# Patient Record
Sex: Male | Born: 1960
Health system: Southern US, Community
[De-identification: ages and names within clinical notes are randomized; demographics above are authoritative.]

## PROBLEM LIST (undated history)

## (undated) DIAGNOSIS — R569 Unspecified convulsions: Secondary | ICD-10-CM

## (undated) DIAGNOSIS — I359 Nonrheumatic aortic valve disorder, unspecified: Secondary | ICD-10-CM

## (undated) DIAGNOSIS — F1490 Cocaine use, unspecified, uncomplicated: Secondary | ICD-10-CM

## (undated) DIAGNOSIS — Z7289 Other problems related to lifestyle: Secondary | ICD-10-CM

## (undated) DIAGNOSIS — Z789 Other specified health status: Secondary | ICD-10-CM

## (undated) DIAGNOSIS — F1090 Alcohol use, unspecified, uncomplicated: Secondary | ICD-10-CM

## (undated) DIAGNOSIS — K611 Rectal abscess: Secondary | ICD-10-CM

## (undated) DIAGNOSIS — K219 Gastro-esophageal reflux disease without esophagitis: Secondary | ICD-10-CM

## (undated) DIAGNOSIS — K279 Peptic ulcer, site unspecified, unspecified as acute or chronic, without hemorrhage or perforation: Secondary | ICD-10-CM

## (undated) DIAGNOSIS — T7840XA Allergy, unspecified, initial encounter: Secondary | ICD-10-CM

## (undated) DIAGNOSIS — I639 Cerebral infarction, unspecified: Secondary | ICD-10-CM

## (undated) DIAGNOSIS — Z8489 Family history of other specified conditions: Secondary | ICD-10-CM

## (undated) DIAGNOSIS — Z720 Tobacco use: Secondary | ICD-10-CM

## (undated) DIAGNOSIS — M1990 Unspecified osteoarthritis, unspecified site: Secondary | ICD-10-CM

## (undated) HISTORY — DX: Cocaine use, unspecified, uncomplicated: F14.90

## (undated) HISTORY — DX: Peptic ulcer, site unspecified, unspecified as acute or chronic, without hemorrhage or perforation: K27.9

## (undated) HISTORY — DX: Unspecified osteoarthritis, unspecified site: M19.90

## (undated) HISTORY — DX: Nonrheumatic aortic valve disorder, unspecified: I35.9

## (undated) HISTORY — DX: Other problems related to lifestyle: Z72.89

## (undated) HISTORY — DX: Gastro-esophageal reflux disease without esophagitis: K21.9

## (undated) HISTORY — DX: Tobacco use: Z72.0

## (undated) HISTORY — DX: Cerebral infarction, unspecified: I63.9

## (undated) HISTORY — PX: NO PAST SURGERIES: SHX2092

## (undated) HISTORY — DX: Allergy, unspecified, initial encounter: T78.40XA

## (undated) HISTORY — DX: Alcohol use, unspecified, uncomplicated: F10.90

## (undated) HISTORY — DX: Other specified health status: Z78.9

## (undated) HISTORY — DX: Unspecified convulsions: R56.9

---

## 2006-04-18 ENCOUNTER — Emergency Department (HOSPITAL_COMMUNITY): Admission: EM | Admit: 2006-04-18 | Discharge: 2006-04-18 | Payer: Self-pay | Admitting: Emergency Medicine

## 2006-04-18 IMAGING — CR DG CHEST 2V
2 series · 2 of 2 positions shown · non-contrast
Comparison: None.

CLINICAL DATA: Back pain, cough and fever.
 CHEST ? 2 VIEW:

[w chest pa]
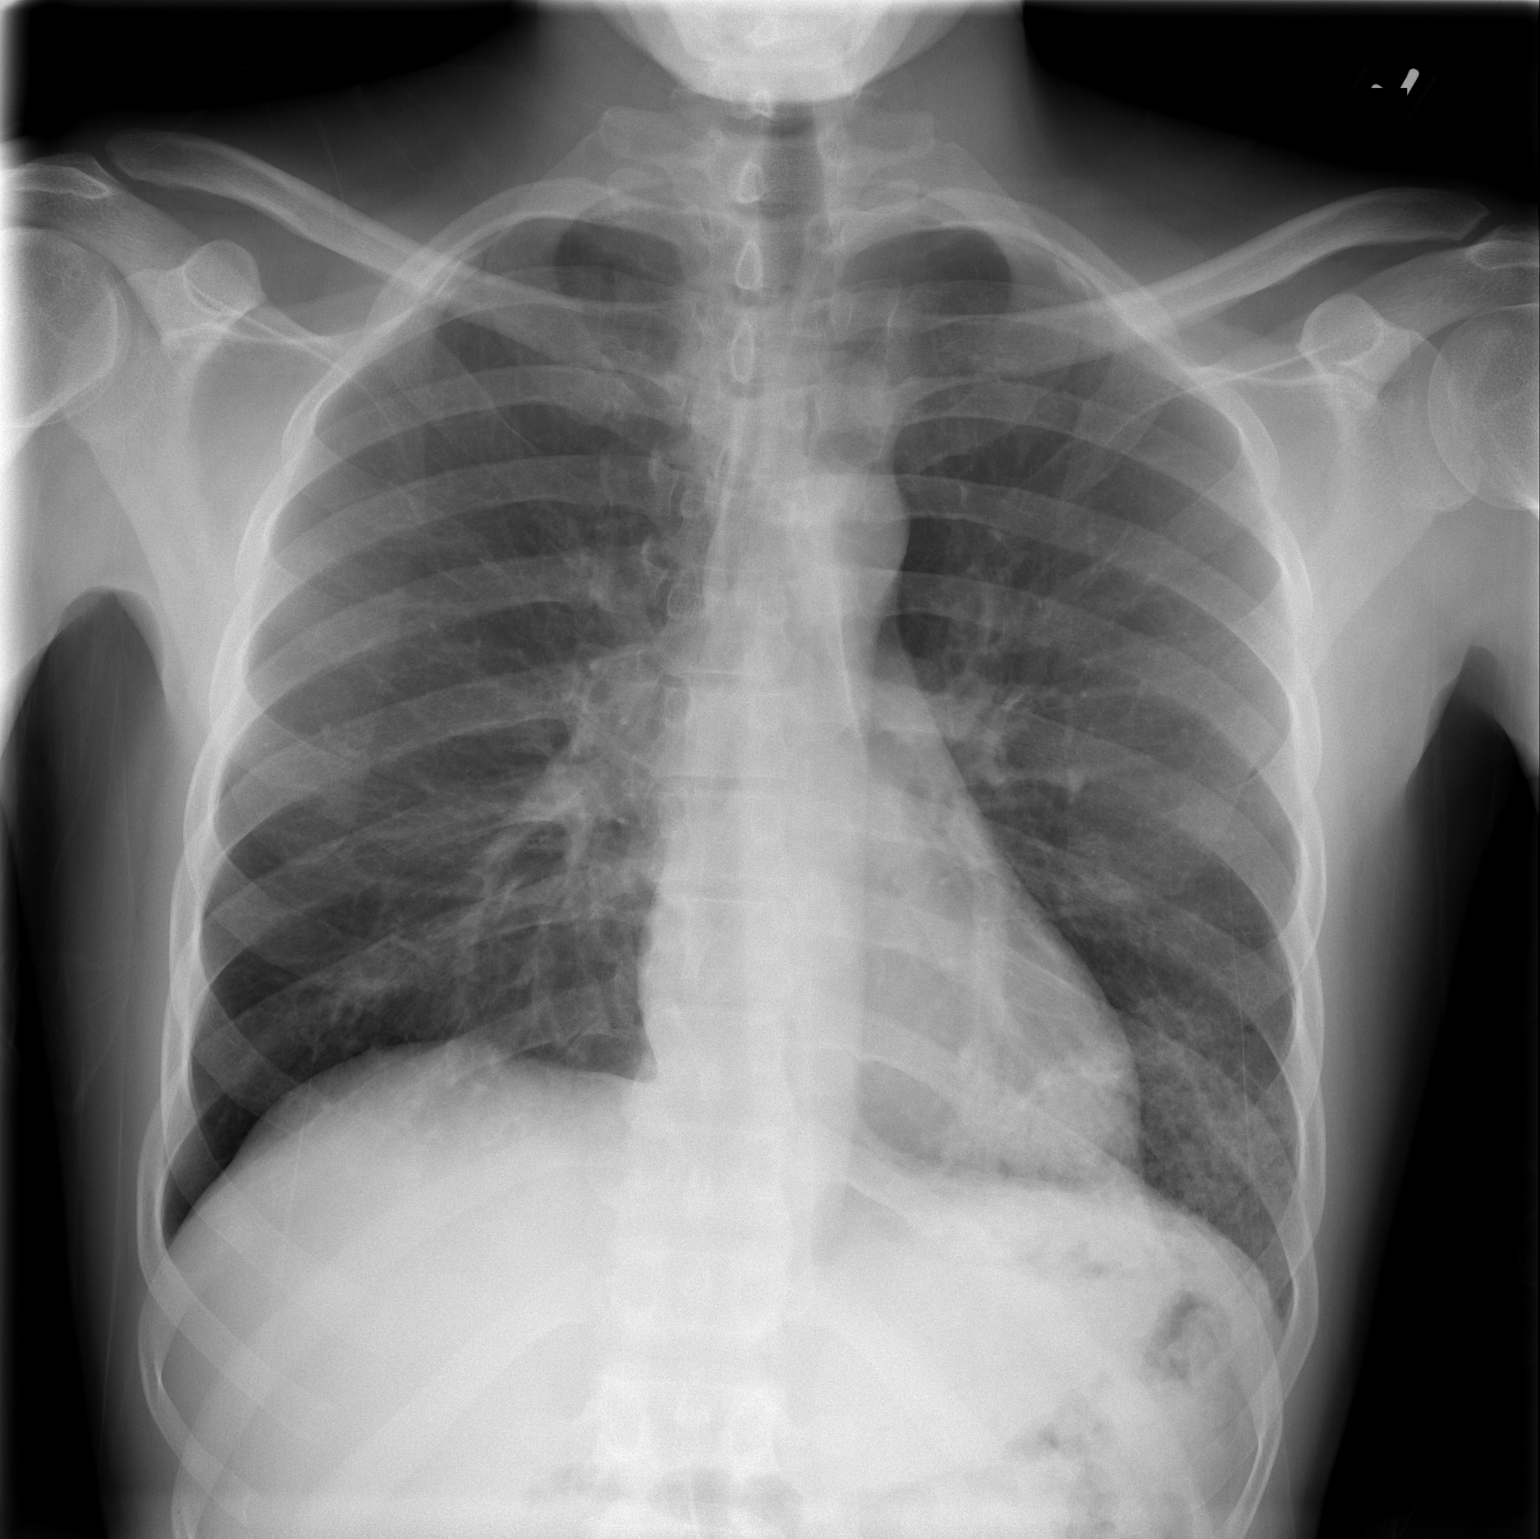

[w chest lat]
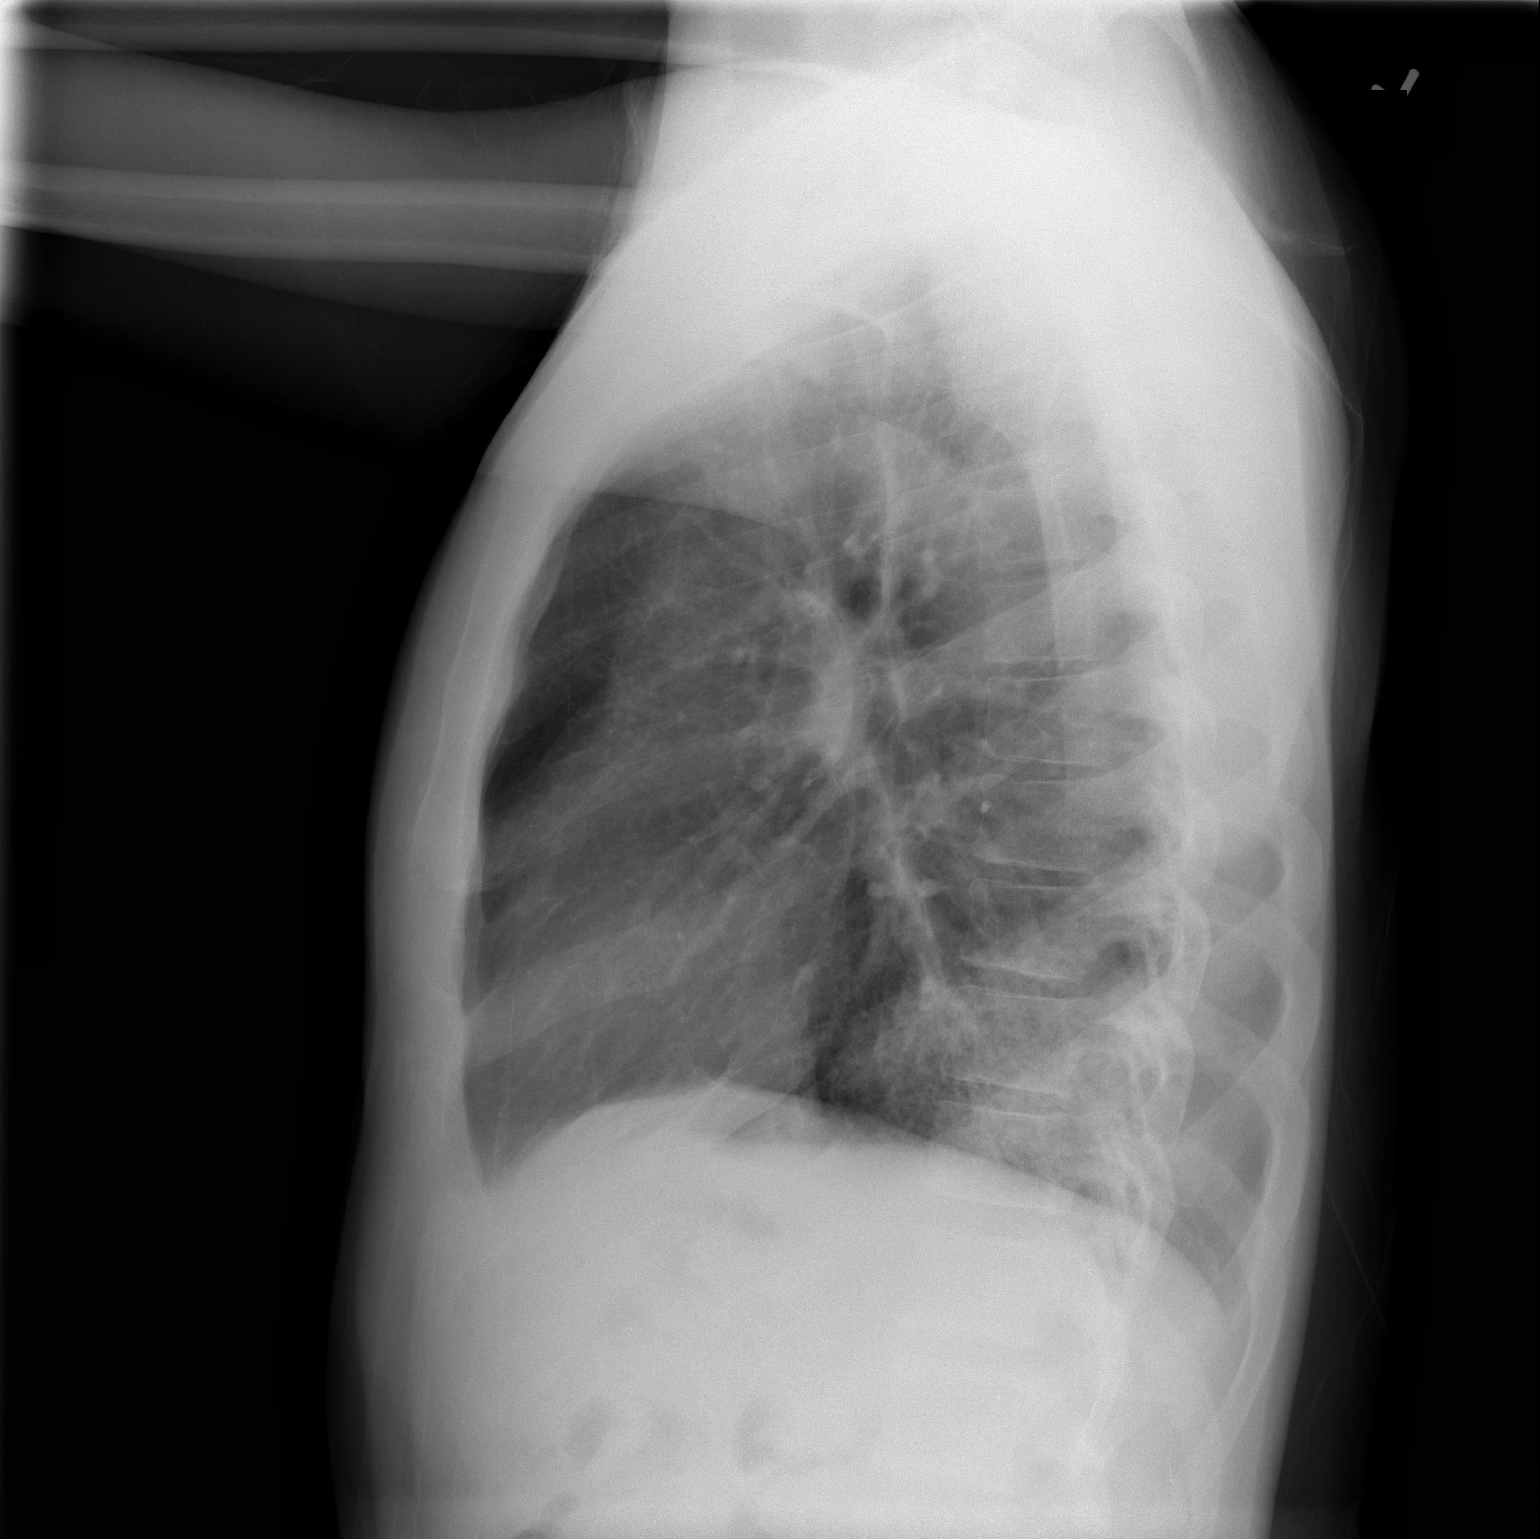

[2 of 2 positions shown; findings below may reference images not displayed]

FINDINGS: Heart size is normal.  No effusions or edema.  There is opacity at the left base which is concerning for pneumonia.
IMPRESSION: Left base opacity concerning for pneumonia.

## 2006-09-04 ENCOUNTER — Ambulatory Visit: Payer: Self-pay | Admitting: *Deleted

## 2006-09-04 ENCOUNTER — Ambulatory Visit: Payer: Self-pay | Admitting: Family Medicine

## 2006-10-09 ENCOUNTER — Ambulatory Visit: Payer: Self-pay | Admitting: Family Medicine

## 2016-06-06 ENCOUNTER — Encounter (HOSPITAL_COMMUNITY): Payer: Self-pay | Admitting: *Deleted

## 2016-06-06 ENCOUNTER — Emergency Department (HOSPITAL_COMMUNITY)
Admission: EM | Admit: 2016-06-06 | Discharge: 2016-06-06 | Disposition: A | Discharge status: Home / Self Care | Payer: Self-pay | Attending: Emergency Medicine | Admitting: Emergency Medicine

## 2016-06-06 DIAGNOSIS — F17200 Nicotine dependence, unspecified, uncomplicated: Secondary | ICD-10-CM | POA: Insufficient documentation

## 2016-06-06 DIAGNOSIS — L0231 Cutaneous abscess of buttock: Secondary | ICD-10-CM | POA: Insufficient documentation

## 2016-06-06 DIAGNOSIS — L0291 Cutaneous abscess, unspecified: Secondary | ICD-10-CM

## 2016-06-06 MED ORDER — LIDOCAINE-EPINEPHRINE (PF) 2 %-1:200000 IJ SOLN
20.0000 mL | Freq: Once | INTRAMUSCULAR | Status: AC
  Administered 2016-06-06: 20 mL via INTRADERMAL
  Filled 2016-06-06: qty 20

## 2016-06-06 MED ORDER — IBUPROFEN 800 MG PO TABS
800.0000 mg | ORAL_TABLET | Freq: Once | ORAL | Status: AC
  Administered 2016-06-06: 800 mg via ORAL
  Filled 2016-06-06: qty 1

## 2016-06-06 MED ORDER — CEPHALEXIN 500 MG PO CAPS: 500.0000 mg | ORAL_CAPSULE | Freq: Four times a day (QID) | ORAL | 0 refills | Status: DC

## 2016-06-06 MED ORDER — OXYCODONE HCL 5 MG PO TABS
5.0000 mg | ORAL_TABLET | Freq: Once | ORAL | Status: AC
  Administered 2016-06-06: 5 mg via ORAL
  Filled 2016-06-06: qty 1

## 2016-06-06 MED ORDER — ACETAMINOPHEN 500 MG PO TABS
1000.0000 mg | ORAL_TABLET | Freq: Once | ORAL | Status: AC
  Administered 2016-06-06: 1000 mg via ORAL
  Filled 2016-06-06: qty 2

## 2016-06-06 MED ORDER — SULFAMETHOXAZOLE-TRIMETHOPRIM 800-160 MG PO TABS: 1.0000 | ORAL_TABLET | Freq: Two times a day (BID) | ORAL | 0 refills | Status: DC

## 2016-06-06 NOTE — ED Triage Notes (Signed)
Pt states abscess to L buttock x 1 week.

## 2016-06-06 NOTE — ED Notes (Signed)
Pt voices understanding discharge instructions and follow up. Ambulatory at departure. Bus pass given.

## 2016-06-06 NOTE — Discharge Instructions (Signed)
Follow up with [place] or your PCP in 2 days for recheck.   Warm compresses or sits baths 4 x a day.

## 2016-06-06 NOTE — ED Provider Notes (Signed)
[facility] DEPT Provider Note   CSN: [id number] Arrival date & time: 06/06/16  0732     History   Chief Complaint Chief Complaint  Patient presents with  . Abscess    HPI Donald York is a 56 y.o. male.  56 yo M with an abscess to the left buttock.  Going on for past week.  Denies fevers, chills, nausea, vomiting.  Some trace drainage.  Worsening decided to come to the ED.     The history is provided by the patient.  Abscess  Location:  Pelvis Pelvic abscess location:  L buttock Size:  2cm Abscess quality: draining, induration and painful   Abscess quality: no itching   Red streaking: no   Duration:  1 week Progression:  Worsening Pain details:    Quality:  Aching, burning, sharp and pressure   Severity:  Moderate   Duration:  1 week   Timing:  Constant   Progression:  Worsening Chronicity:  New Context: not diabetes, not injected drug use and not skin injury   Relieved by:  Nothing Worsened by:  Nothing Ineffective treatments:  None tried Associated symptoms: no fever, no headaches and no vomiting   Risk factors: no prior abscess     History reviewed. No pertinent past medical history.  There are no active problems to display for this patient.   History reviewed. No pertinent surgical history.     Home Medications    Prior to Admission medications   Medication Sig Start Date End Date Taking? Authorizing Provider  cephALEXin (KEFLEX) 500 MG capsule Take 1 capsule (500 mg total) by mouth 4 (four) times daily. 06/06/16   Deno Etienne, DO  sulfamethoxazole-trimethoprim (BACTRIM DS,SEPTRA DS) 800-160 MG tablet Take 1 tablet by mouth 2 (two) times daily. 06/06/16 06/13/16  Deno Etienne, DO    Family History No family history on file.  Social History Social History  Substance Use Topics  . Smoking status: Current Some Day Smoker    Packs/day: 1.00  . Smokeless tobacco: Never Used  . Alcohol use Yes     Comment: 40 oz beer, every now and then      Allergies   Patient has no known allergies.   Review of Systems Review of Systems  Constitutional: Negative for chills and fever.  HENT: Negative for congestion and facial swelling.   Eyes: Negative for discharge and visual disturbance.  Respiratory: Negative for shortness of breath.   Cardiovascular: Negative for chest pain and palpitations.  Gastrointestinal: Negative for abdominal pain, diarrhea and vomiting.  Musculoskeletal: Negative for arthralgias and myalgias.  Skin: Positive for wound. Negative for color change and rash.  Neurological: Negative for tremors, syncope and headaches.  Psychiatric/Behavioral: Negative for confusion and dysphoric mood.     Physical Exam Updated Vital Signs BP 98/70 (BP Location: Left Arm)   Pulse 83   Temp 98.8 F (37.1 C) (Oral)   Resp 20   Ht 6' (1.829 m)   Wt 176 lb 5 oz (80 kg)   SpO2 96%   BMI 23.91 kg/m   Physical Exam  Constitutional: He is oriented to person, place, and time. He appears well-developed and well-nourished.  HENT:  Head: Normocephalic and atraumatic.  Eyes: EOM are normal. Pupils are equal, round, and reactive to light.  Neck: Normal range of motion. Neck supple. No JVD present.  Cardiovascular: Normal rate and regular rhythm.  Exam reveals no gallop and no friction rub.   No murmur heard. Pulmonary/Chest: No respiratory distress.  He has no wheezes.  Abdominal: He exhibits no distension and no mass. There is no tenderness. There is no rebound and no guarding.  Musculoskeletal: Normal range of motion.  Neurological: He is alert and oriented to person, place, and time.  Skin: No rash noted. No pallor.     Psychiatric: He has a normal mood and affect. His behavior is normal.  Nursing note and vitals reviewed.    ED Treatments / Results  Labs (all labs ordered are listed, but only abnormal results are displayed) Labs Reviewed - No data to display  EKG  EKG Interpretation None        Radiology No results found.  Procedures .Marland KitchenIncision and Drainage Date/Time: 06/06/2016 9:57 AM Performed by: Tyrone Nine, Tank Difiore Authorized by: Deno Etienne   Consent:    Consent obtained:  Verbal   Consent given by:  Patient   Risks discussed:  Bleeding, incomplete drainage, infection and damage to other organs   Alternatives discussed:  No treatment Location:    Type:  Abscess   Location:  Anogenital   Anogenital location: left buttock. Pre-procedure details:    Skin preparation:  Chloraprep Anesthesia (see MAR for exact dosages):    Anesthesia method:  Local infiltration   Local anesthetic:  Lidocaine 2% WITH epi Procedure type:    Complexity:  Complex Procedure details:    Incision types:  Single straight   Incision depth:  Submucosal   Scalpel blade:  11   Wound management:  Probed and deloculated   Drainage:  Bloody and purulent   Drainage amount:  Moderate   Wound treatment:  Wound left open   Packing materials:  None Post-procedure details:    Patient tolerance of procedure:  Tolerated well, no immediate complications   (including critical care time) Emergency Focused Ultrasound Exam Limited Ultrasound of Soft Tissue   Performed and interpreted by Dr. Tyrone Nine Indication: evaluation for infection or foreign body Transverse and Sagittal views of left buttock are obtained in real time for the purposes of evaluation of skin and underlying soft tissues.  Findings:  heterogeneous fluid collection, with hyperemia/edema of surrounding tissue Interpretation:  abscess, with cellulitis Images archived electronically.  CPT Codes:  Pelvic wall 51025-85    Medications Ordered in ED Medications  acetaminophen (TYLENOL) tablet 1,000 mg (1,000 mg Oral Given 06/06/16 0904)  ibuprofen (ADVIL,MOTRIN) tablet 800 mg (800 mg Oral Given 06/06/16 0905)  oxyCODONE (Oxy IR/ROXICODONE) immediate release tablet 5 mg (5 mg Oral Given 06/06/16 0905)  lidocaine-EPINEPHrine (XYLOCAINE W/EPI) 2  %-1:200000 (PF) injection 20 mL (20 mLs Intradermal Given by Other 06/06/16 2778)     Initial Impression / Assessment and Plan / ED Course  I have reviewed the triage vital signs and the nursing notes.  Pertinent labs & imaging results that were available during my care of the patient were reviewed by me and considered in my medical decision making (see chart for details).     56 yo M With a left buttock abscess. I&D at bedside.  D/c home, due to extent of induration will start on abx.   9:59 AM:  I have discussed the diagnosis/risks/treatment options with the patient and believe the pt to be eligible for discharge home to follow-up with PCP. We also discussed returning to the ED immediately if new or worsening sx occur. We discussed the sx which are most concerning (e.g., sudden worsening pain, fever, inability to tolerate by mouth) that necessitate immediate return. Medications administered to the patient during their visit and  any new prescriptions provided to the patient are listed below.  Medications given during this visit Medications  acetaminophen (TYLENOL) tablet 1,000 mg (1,000 mg Oral Given 06/06/16 0904)  ibuprofen (ADVIL,MOTRIN) tablet 800 mg (800 mg Oral Given 06/06/16 0905)  oxyCODONE (Oxy IR/ROXICODONE) immediate release tablet 5 mg (5 mg Oral Given 06/06/16 0905)  lidocaine-EPINEPHrine (XYLOCAINE W/EPI) 2 %-1:200000 (PF) injection 20 mL (20 mLs Intradermal Given by Other 06/06/16 2811)     The patient appears reasonably screen and/or stabilized for discharge and I doubt any other medical condition or other [facility] requiring further screening, evaluation, or treatment in the ED at this time prior to discharge.    Final Clinical Impressions(s) / ED Diagnoses   Final diagnoses:  Abscess    New Prescriptions New Prescriptions   CEPHALEXIN (KEFLEX) 500 MG CAPSULE    Take 1 capsule (500 mg total) by mouth 4 (four) times daily.   SULFAMETHOXAZOLE-TRIMETHOPRIM (BACTRIM DS,SEPTRA  DS) 800-160 MG TABLET    Take 1 tablet by mouth 2 (two) times daily.     Deno Etienne, DO 06/06/16 [phone]

## 2016-06-09 ENCOUNTER — Emergency Department (HOSPITAL_COMMUNITY): Payer: Self-pay

## 2016-06-09 ENCOUNTER — Inpatient Hospital Stay (HOSPITAL_COMMUNITY)
Admission: EM | Admit: 2016-06-09 | Discharge: 2016-06-12 | DRG: 394 | Disposition: A | Discharge status: Home / Self Care | Payer: Self-pay | Attending: Internal Medicine | Admitting: Internal Medicine

## 2016-06-09 ENCOUNTER — Encounter (HOSPITAL_COMMUNITY): Payer: Self-pay | Admitting: Emergency Medicine

## 2016-06-09 DIAGNOSIS — Z720 Tobacco use: Secondary | ICD-10-CM | POA: Diagnosis present

## 2016-06-09 DIAGNOSIS — Z590 Homelessness: Secondary | ICD-10-CM

## 2016-06-09 DIAGNOSIS — F17210 Nicotine dependence, cigarettes, uncomplicated: Secondary | ICD-10-CM | POA: Diagnosis present

## 2016-06-09 DIAGNOSIS — Z9119 Patient's noncompliance with other medical treatment and regimen: Secondary | ICD-10-CM

## 2016-06-09 DIAGNOSIS — K611 Rectal abscess: Principal | ICD-10-CM | POA: Diagnosis present

## 2016-06-09 DIAGNOSIS — L03315 Cellulitis of perineum: Secondary | ICD-10-CM | POA: Diagnosis present

## 2016-06-09 DIAGNOSIS — F1010 Alcohol abuse, uncomplicated: Secondary | ICD-10-CM

## 2016-06-09 DIAGNOSIS — R748 Abnormal levels of other serum enzymes: Secondary | ICD-10-CM

## 2016-06-09 DIAGNOSIS — N141 Nephropathy induced by other drugs, medicaments and biological substances: Secondary | ICD-10-CM | POA: Diagnosis present

## 2016-06-09 DIAGNOSIS — N179 Acute kidney failure, unspecified: Secondary | ICD-10-CM | POA: Diagnosis present

## 2016-06-09 DIAGNOSIS — Z8249 Family history of ischemic heart disease and other diseases of the circulatory system: Secondary | ICD-10-CM

## 2016-06-09 DIAGNOSIS — B9562 Methicillin resistant Staphylococcus aureus infection as the cause of diseases classified elsewhere: Secondary | ICD-10-CM | POA: Diagnosis present

## 2016-06-09 DIAGNOSIS — T368X5A Adverse effect of other systemic antibiotics, initial encounter: Secondary | ICD-10-CM | POA: Diagnosis present

## 2016-06-09 DIAGNOSIS — I10 Essential (primary) hypertension: Secondary | ICD-10-CM | POA: Diagnosis present

## 2016-06-09 DIAGNOSIS — Y92239 Unspecified place in hospital as the place of occurrence of the external cause: Secondary | ICD-10-CM | POA: Diagnosis present

## 2016-06-09 DIAGNOSIS — N492 Inflammatory disorders of scrotum: Secondary | ICD-10-CM | POA: Diagnosis present

## 2016-06-09 DIAGNOSIS — F1490 Cocaine use, unspecified, uncomplicated: Secondary | ICD-10-CM | POA: Diagnosis present

## 2016-06-09 HISTORY — DX: Rectal abscess: K61.1

## 2016-06-09 HISTORY — DX: Family history of other specified conditions: Z84.89

## 2016-06-09 LAB — CBC WITH DIFFERENTIAL/PLATELET
Basophils Absolute: 0 10*3/uL (ref 0.0–0.1)
Basophils Relative: 0 %
EOS PCT: 2 %
Eosinophils Absolute: 0.4 10*3/uL (ref 0.0–0.7)
HEMATOCRIT: 38.7 % — AB (ref 39.0–52.0)
Hemoglobin: 13 g/dL (ref 13.0–17.0)
Lymphocytes Relative: 12 %
Lymphs Abs: 2.3 10*3/uL (ref 0.7–4.0)
MCH: 29.3 pg (ref 26.0–34.0)
MCHC: 33.6 g/dL (ref 30.0–36.0)
MCV: 87.2 fL (ref 78.0–100.0)
MONO ABS: 1.5 10*3/uL — AB (ref 0.1–1.0)
MONOS PCT: 8 %
Neutro Abs: 14.6 10*3/uL — ABNORMAL HIGH (ref 1.7–7.7)
Neutrophils Relative %: 78 %
Platelets: 289 10*3/uL (ref 150–400)
RBC: 4.44 MIL/uL (ref 4.22–5.81)
RDW: 13.5 % (ref 11.5–15.5)
WBC: 18.8 10*3/uL — AB (ref 4.0–10.5)

## 2016-06-09 LAB — I-STAT CHEM 8, ED
BUN: 13 mg/dL (ref 6–20)
Calcium, Ion: 1.07 mmol/L — ABNORMAL LOW (ref 1.15–1.40)
Chloride: 103 mmol/L (ref 101–111)
Creatinine, Ser: 1.4 mg/dL — ABNORMAL HIGH (ref 0.61–1.24)
Glucose, Bld: 110 mg/dL — ABNORMAL HIGH (ref 65–99)
HEMATOCRIT: 38 % — AB (ref 39.0–52.0)
HEMOGLOBIN: 12.9 g/dL — AB (ref 13.0–17.0)
Potassium: 3.5 mmol/L (ref 3.5–5.1)
SODIUM: 139 mmol/L (ref 135–145)
TCO2: 26 mmol/L (ref 0–100)

## 2016-06-09 LAB — BASIC METABOLIC PANEL
ANION GAP: 10 (ref 5–15)
BUN: 13 mg/dL (ref 6–20)
CALCIUM: 8.3 mg/dL — AB (ref 8.9–10.3)
CO2: 27 mmol/L (ref 22–32)
Chloride: 105 mmol/L (ref 101–111)
Creatinine, Ser: 1.4 mg/dL — ABNORMAL HIGH (ref 0.61–1.24)
GFR, EST NON AFRICAN AMERICAN: 55 mL/min — AB (ref >60)
Glucose, Bld: 113 mg/dL — ABNORMAL HIGH (ref 65–99)
Potassium: 3.7 mmol/L (ref 3.5–5.1)
SODIUM: 142 mmol/L (ref 135–145)

## 2016-06-09 LAB — LACTIC ACID, PLASMA
LACTIC ACID, VENOUS: 1.1 mmol/L (ref 0.5–1.9)
LACTIC ACID, VENOUS: 1.1 mmol/L (ref 0.5–1.9)

## 2016-06-09 LAB — I-STAT CG4 LACTIC ACID, ED
LACTIC ACID, VENOUS: 0.68 mmol/L (ref 0.5–1.9)
LACTIC ACID, VENOUS: 2.72 mmol/L — AB (ref 0.5–1.9)

## 2016-06-09 IMAGING — CT CT PELVIS W/ CM
2 of 3 series · 17 of 46 positions shown, 19 images · IV contrast (Omni 300)
Comparison: None.

CLINICAL DATA: Left buttocks boil.  Evaluate for abscess.

EXAM:
CT PELVIS WITH CONTRAST
TECHNIQUE: Multidetector CT imaging of the pelvis was performed using the
standard protocol following the bolus administration of intravenous
contrast.
CONTRAST:  100mL [S5] IOPAMIDOL ([S5]) INJECTION 61%

[Series 3: a/p w/ 5mm · axial · 0.82mm/px · z∈[+848,+1153]mm · 14 of 71 slices shown, 16 images]
[im 5/71  soft-tissue]
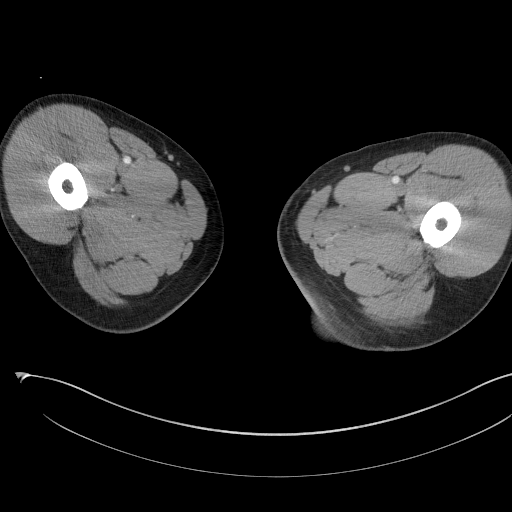
[im 5/71  bone]
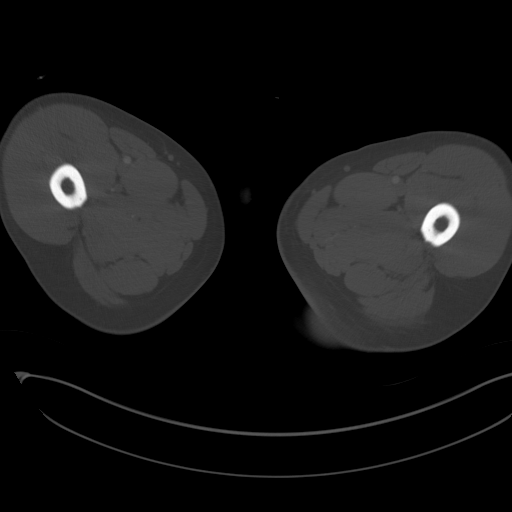
[im 10/71  soft-tissue]
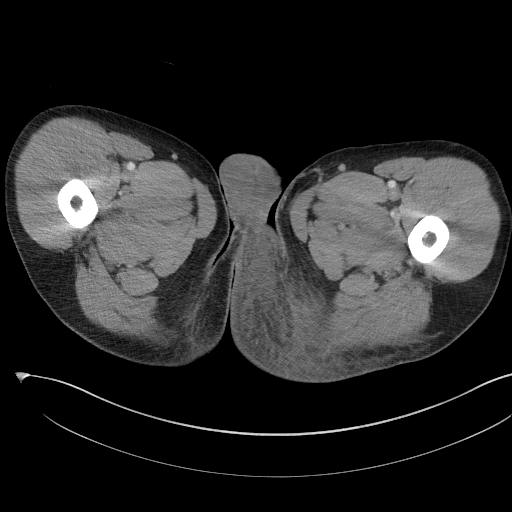
[im 14/71  soft-tissue]
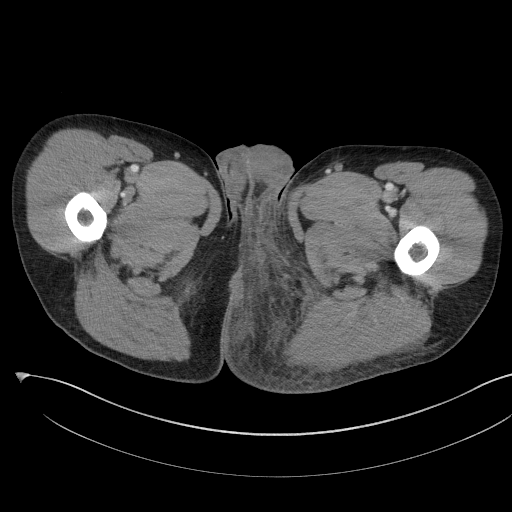
[im 19/71  soft-tissue]
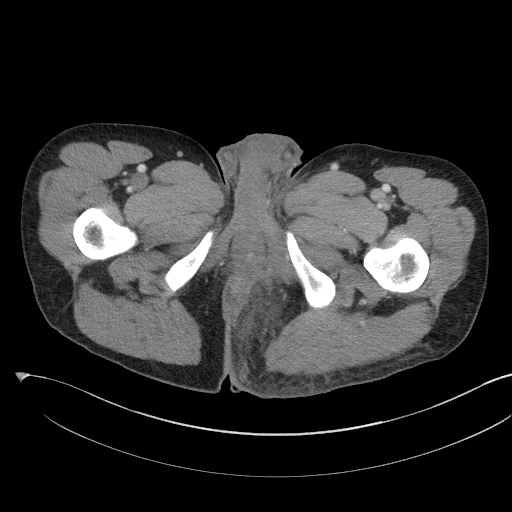
[im 23/71  soft-tissue]
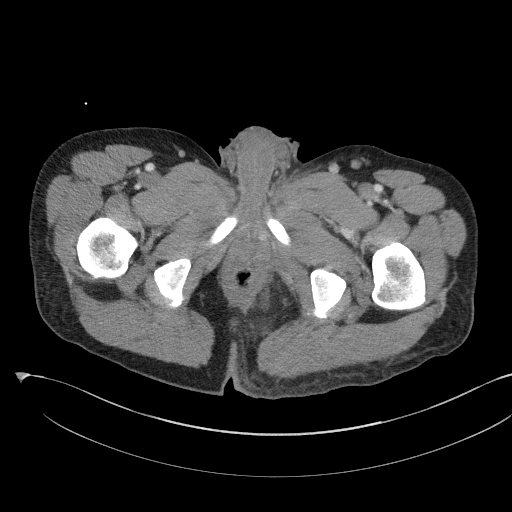
[im 28/71  soft-tissue]
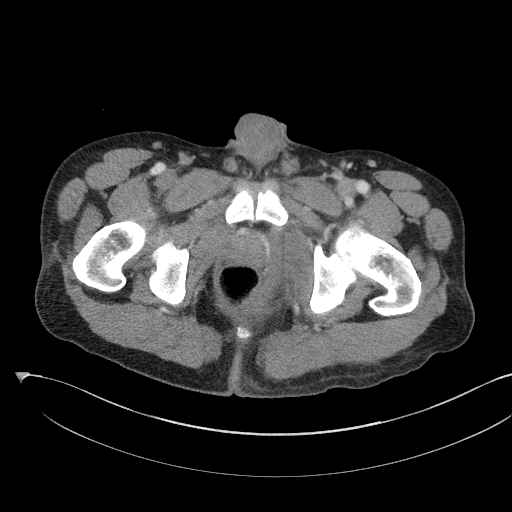
[im 32/71  soft-tissue]
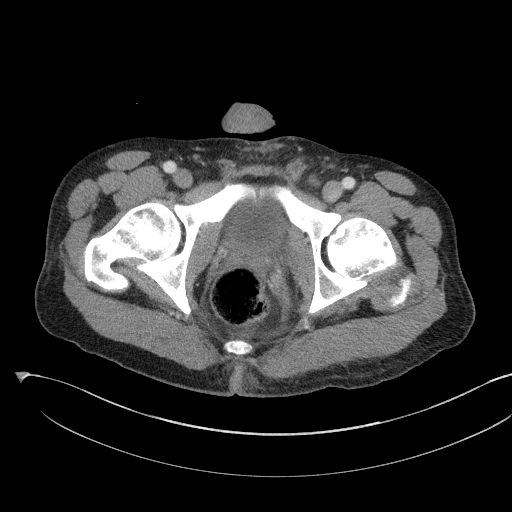
[im 39/71  soft-tissue]
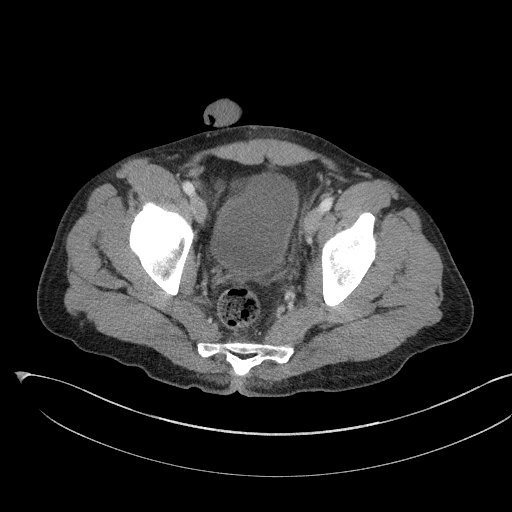
[im 43/71  soft-tissue]
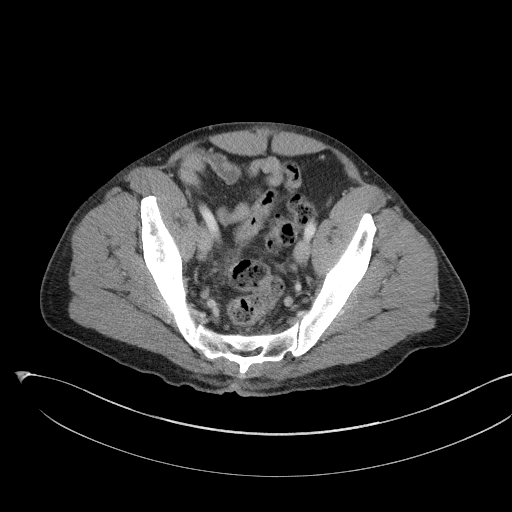
[im 43/71  bone]
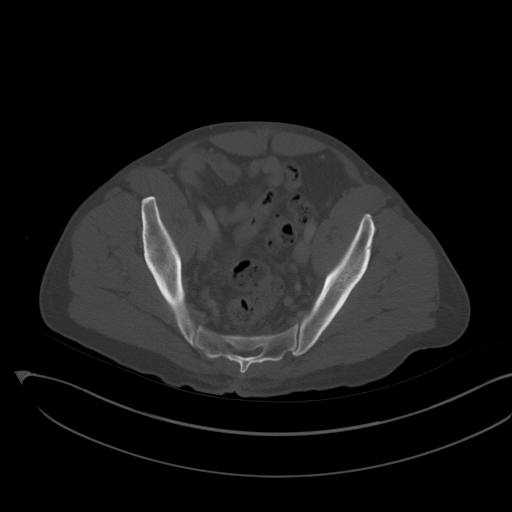
[im 48/71  soft-tissue]
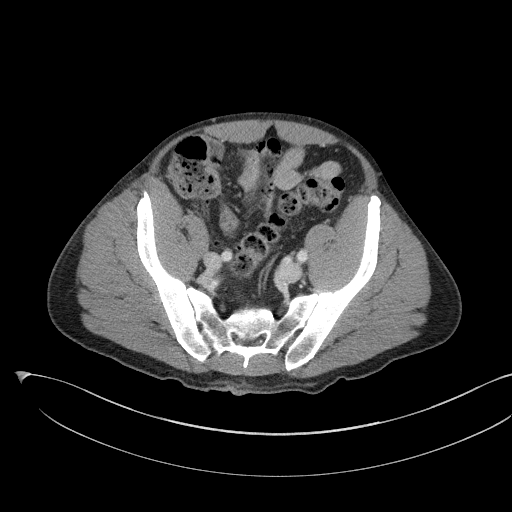
[im 52/71  soft-tissue]
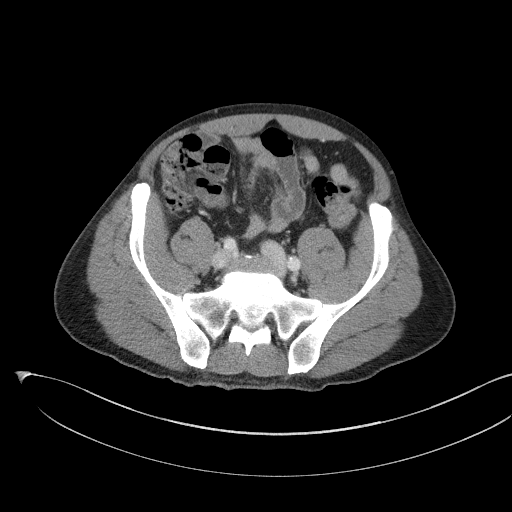
[im 57/71  soft-tissue]
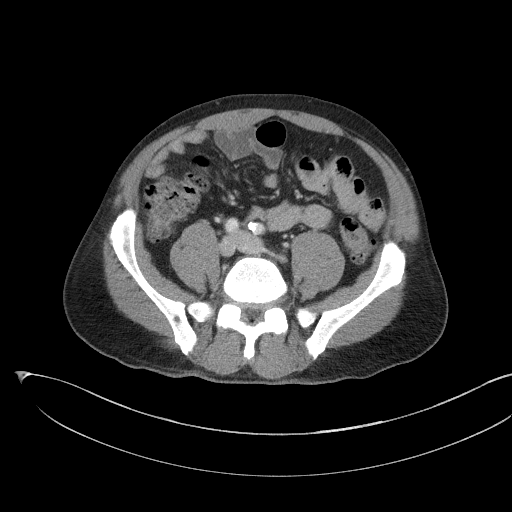
[im 61/71  soft-tissue]
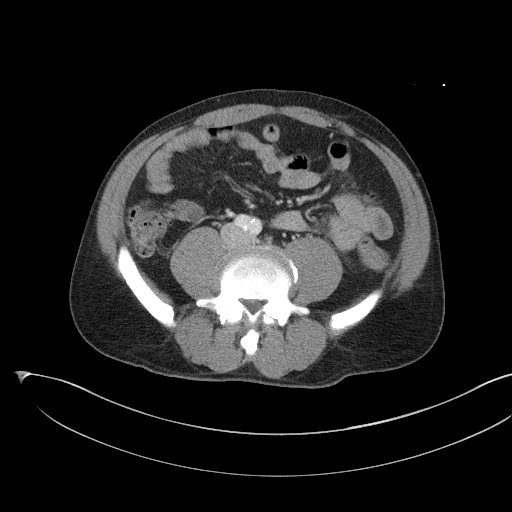
[im 66/71  soft-tissue]
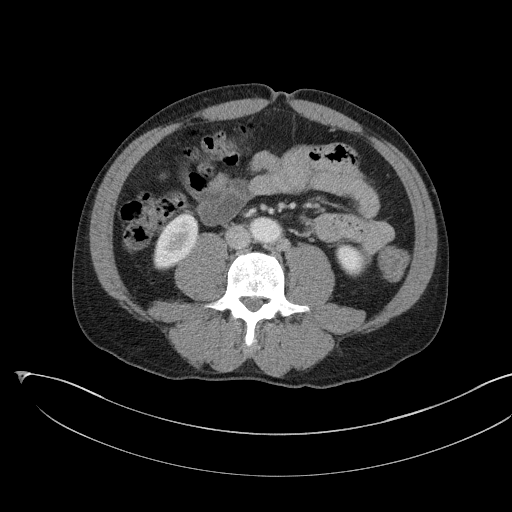

[Series 5: a/p w/ cor · coronal · 0.69mm/px · 3 of 148 slices shown]
[im 66/148  soft-tissue]
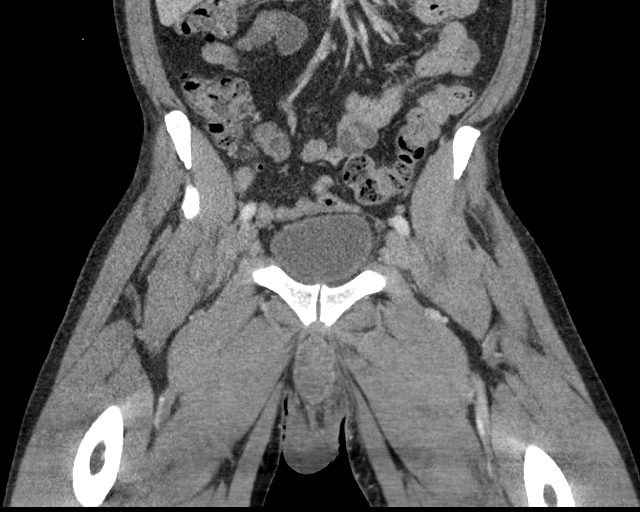
[im 82/148  soft-tissue]
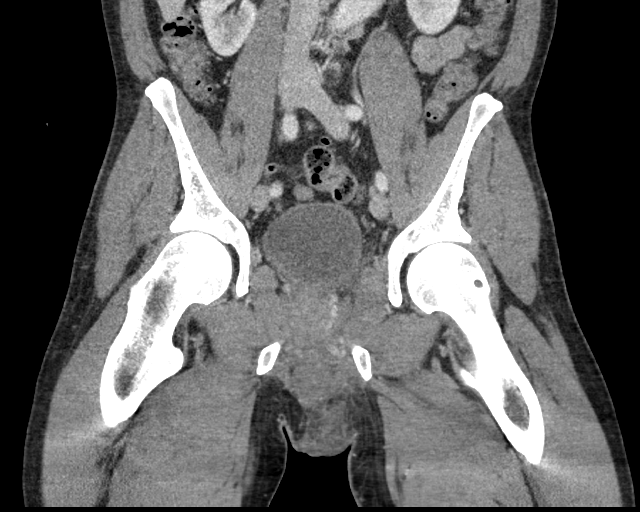
[im 99/148  soft-tissue]
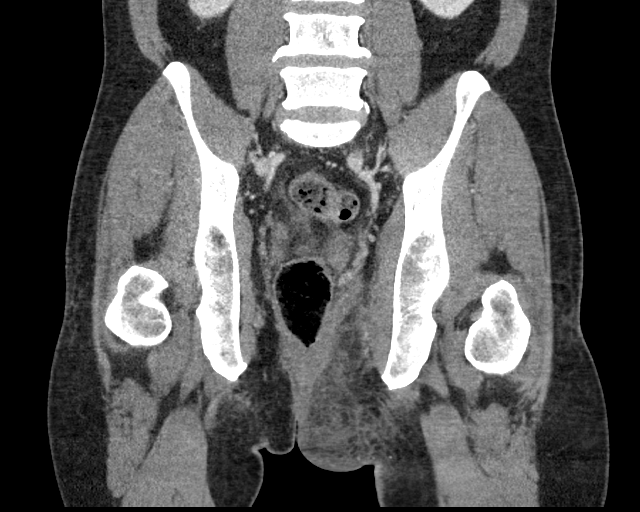

[17 of 46 positions shown; findings below may reference images not displayed]

FINDINGS: Skin thickening and fat edema in the left gluteal and perineal
region, extending into the ischiorectal fossa. Mild supralevator fat
edema around the rectum and along the left pelvic side wall.
Negative for abscess or soft tissue emphysema. Nodular soft tissue
density along the gluteal cleft could be packing or skin tag. This
should be readily visualized on exam.

Urinary Tract:  No abnormality visualized.

Bowel: Unremarkable visualized pelvic bowel loops. Negative appendix

Vascular/Lymphatic: No pathologically enlarged lymph nodes.

Reproductive:  Negative.  No scrotal wall involvement.

Musculoskeletal: No evidence of osseous infection or hip joint
effusion. Mild asymmetric swelling of the left obturator interna.
IMPRESSION: Left gluteal and perineal cellulitis extending into the left
ischiorectal fossa and supralevator pelvis. Negative for abscess or
soft tissue gas.

## 2016-06-09 MED ORDER — VANCOMYCIN HCL 10 G IV SOLR
1500.0000 mg | Freq: Once | INTRAVENOUS | Status: AC
  Administered 2016-06-09: 1500 mg via INTRAVENOUS
  Filled 2016-06-09: qty 1500

## 2016-06-09 MED ORDER — DEXTROSE 5 % IV SOLN
1.0000 g | INTRAVENOUS | Status: DC
  Administered 2016-06-10: 1 g via INTRAVENOUS
  Filled 2016-06-09 (×2): qty 10

## 2016-06-09 MED ORDER — ONDANSETRON HCL 4 MG/2ML IJ SOLN
4.0000 mg | Freq: Once | INTRAMUSCULAR | Status: AC
  Administered 2016-06-09: 4 mg via INTRAVENOUS
  Filled 2016-06-09: qty 2

## 2016-06-09 MED ORDER — SODIUM CHLORIDE 0.9 % IV BOLUS (SEPSIS)
1000.0000 mL | Freq: Once | INTRAVENOUS | Status: AC
  Administered 2016-06-09: 1000 mL via INTRAVENOUS

## 2016-06-09 MED ORDER — METRONIDAZOLE IN NACL 5-0.79 MG/ML-% IV SOLN
500.0000 mg | Freq: Three times a day (TID) | INTRAVENOUS | Status: DC
  Administered 2016-06-09 – 2016-06-11 (×6): 500 mg via INTRAVENOUS
  Filled 2016-06-09 (×6): qty 100

## 2016-06-09 MED ORDER — NICOTINE 14 MG/24HR TD PT24
14.0000 mg | MEDICATED_PATCH | Freq: Every day | TRANSDERMAL | Status: DC
  Filled 2016-06-09 (×2): qty 1

## 2016-06-09 MED ORDER — VANCOMYCIN HCL IN DEXTROSE 1-5 GM/200ML-% IV SOLN
1000.0000 mg | Freq: Two times a day (BID) | INTRAVENOUS | Status: DC
  Administered 2016-06-09 – 2016-06-10 (×3): 1000 mg via INTRAVENOUS
  Filled 2016-06-09 (×4): qty 200

## 2016-06-09 MED ORDER — ACETAMINOPHEN 325 MG PO TABS: 650.0000 mg | ORAL_TABLET | Freq: Four times a day (QID) | ORAL | Status: DC | PRN

## 2016-06-09 MED ORDER — ACETAMINOPHEN 650 MG RE SUPP: 650.0000 mg | Freq: Four times a day (QID) | RECTAL | Status: DC | PRN

## 2016-06-09 MED ORDER — MORPHINE SULFATE (PF) 4 MG/ML IV SOLN
2.0000 mg | Freq: Once | INTRAVENOUS | Status: AC
  Administered 2016-06-09: 2 mg via INTRAVENOUS
  Filled 2016-06-09: qty 1

## 2016-06-09 MED ORDER — IOPAMIDOL (ISOVUE-300) INJECTION 61%
INTRAVENOUS | Status: AC
  Administered 2016-06-09: 100 mL
  Filled 2016-06-09: qty 100

## 2016-06-09 MED ORDER — ENOXAPARIN SODIUM 40 MG/0.4ML ~~LOC~~ SOLN
40.0000 mg | SUBCUTANEOUS | Status: DC
  Administered 2016-06-09 – 2016-06-11 (×3): 40 mg via SUBCUTANEOUS
  Filled 2016-06-09 (×3): qty 0.4

## 2016-06-09 MED ORDER — DEXTROSE 5 % IV SOLN
1.0000 g | Freq: Once | INTRAVENOUS | Status: AC
  Administered 2016-06-09: 1 g via INTRAVENOUS
  Filled 2016-06-09: qty 10

## 2016-06-09 MED ORDER — OXYCODONE HCL 5 MG PO TABS
5.0000 mg | ORAL_TABLET | ORAL | Status: DC | PRN
  Administered 2016-06-09 – 2016-06-10 (×4): 5 mg via ORAL
  Filled 2016-06-09 (×4): qty 1

## 2016-06-09 NOTE — ED Notes (Signed)
Report given.

## 2016-06-09 NOTE — Progress Notes (Signed)
Pharmacy Antibiotic Note  Donald York is a 56 y.o. male admitted on 06/09/2016 with cellulitis.  Pharmacy has been consulted for vancomycin and ceftriaxone dosing. Pt is afebrile and WBC is elevated at 18.8. Of note, pt s/p I&D 2 days PTA. Lactic acid is elevated at 2.72. Also started on flagyl per MD.   Plan: Vancomycin 1gm IV Q12H Ceftriaxone 1gm IV Q24H F/u renal fxn, C&S, clinical status and trough at SS     Temp (24hrs), Avg:98.6 F (37 C), Min:98.6 F (37 C), Max:98.6 F (37 C)   Recent Labs Lab 06/09/16 0750 06/09/16 0802 06/09/16 1053  WBC 18.8*  --   --   CREATININE 1.40* 1.40*  --   LATICACIDVEN  --  0.68 2.72*    Estimated Creatinine Clearance: 64.7 mL/min (A) (by C-G formula based on SCr of 1.4 mg/dL (H)).    No Known Allergies  Antimicrobials this admission: Vanc 4/16>> CTX 4/16>> Flagyl 4/16>>  Dose adjustments this admission: N/A  Microbiology results: Pending  Thank you for allowing pharmacy to be a part of this patient's care.  Jadene Stemmer, Rande Lawman 06/09/2016 1:37 PM

## 2016-06-09 NOTE — ED Triage Notes (Signed)
Pt returning to ER for right buttocks abscess which he had drained last week. Pt reports he has not been able to get antibiotic prescriptions due to not having sufficient funds for them until today. Pt reports abscess is traveling towards his testicles. NAD. No fevers or chills reported. VSS.

## 2016-06-09 NOTE — H&P (Signed)
Date: 06/09/2016               Patient Name:  Donald York MRN: [id number]  DOB: April 22, 1960 Age / Sex: 56 y.o.,  male   PCP: No Pcp Per Patient         Medical Service: Internal Medicine Teaching Service         Attending Physician: Dr. Lucious Groves, DO    First Contact: Dr. Gay Filler Pager: [phone]  Second Contact: Dr. Tiburcio Pea Pager: [phone]       After Hours (After 5p/  First Contact Pager: [phone]  weekends / holidays): Second Contact Pager: [phone]   Chief Complaint: buttock pain  History of Present Illness: [name] is a 56 yo M with PMHx of tobacco abuse who presents to the ED with complaint of severe buttock pain.  Patient states that one week ago he noticed a boil on his left buttock without drainage. He noticed the area began to become hard and then spread. The area later became painful and tender with movement and on palpation. He was seen in the emergency department on 06/06/2016 and underwent I&D for a left buttock abscess. At that time, he denied fever, chills, nausea, vomiting. There was some trace drainage noticed. A moderate amount of bloody and purulent drainage was obtained. Patient was discharged on Bactrim and Keflex and instructed to take the antibiotics, use sitz bath and continue dressing changes. Due to his financial constraints and current living situation in a homeless shelter, patient was unable to obtain the antibiotics or perform dressing changes or sitz baths. He returns to the emergency department today with worsening pain at an 11/10 and spreading of the area from his superior left buttock inferiorly towards his scrotum. He admits to chills and pain but denies fever, nausea, vomiting, diarrhea, constipation, dysuria, testicular pain, penile discharge, chest pain, shortness of breath. He reports he has never had an issue like this before and reports he is overall healthy without any medical problems.  In the emergency department, patient was afebrile,  normotensive, heart rate 83, respiratory rate 19, satting 99% on room air. Laboratory workup revealed a creatinine of 1.40, leukocytosis at 18.8 with increased left shift. CT abdomen and pelvis with contrast was obtained due to concern for abscess or subcutaneous air. CT abdomen and pelvis showed cellulitis with skin thickening and fat edema in the left gluteal and perineal region, extending into the ischiorectal fossa. Mild supralevator fat edema around the rectum and along the left pelvic side wall. There is no evidence of abscess or soft tissue emphysema. Patient was started on vancomycin and ceftriaxone in the emergency department.   Meds: Current Facility-Administered Medications  Medication Dose Route Frequency Provider Last Rate Last Dose  . oxyCODONE (Oxy IR/ROXICODONE) immediate release tablet 5 mg  5 mg Oral Q4H PRN Alexa Angela Burke, MD      . vancomycin (VANCOCIN) 1,500 mg in sodium chloride 0.9 % 500 mL IVPB  1,500 mg Intravenous Once Deno Etienne, DO 250 mL/hr at 06/09/16 0953 1,500 mg at 06/09/16 0953   No current outpatient prescriptions on file.    Allergies: Allergies as of 06/09/2016  . (No Known Allergies)   History reviewed. No pertinent past medical history. Tobacco abuser.  Family History:  Brother: Seizures and CAD  Social History:  Tobacco Use: One pack per day for 20 years Alcohol Use: One 40 ounce beer 3 times per week Illicit Drug Use: Occasional cocaine use, denies injection drug  use  Review of Systems: A complete ROS was reviewed and negative except as per HPI.   Physical Exam: Blood pressure 103/72, pulse 78, temperature 98.6 F (37 C), temperature source Oral, resp. rate 19, SpO2 98 %. General: Vital signs reviewed.  Patient is in no acute distress and cooperative with exam.  Eyes: PERRL, conjunctivae normal, no scleral icterus.  Ears, Nose, Throat, and Mouth: Normal bilateral nasal turbinates. Enlarged right tonsil with adhered uvula. Moist mucous  membranes. No palpable cervical lymphadenopathy. Cardiovascular: RRR,  no murmurs, gallops, or rubs. No JVD or carotid bruit present. No lower extremity edema bilaterally. Bilateral radial and pedal pulses are intact and symmetric bilaterally.  Pulmonary: Clear to auscultation bilaterally, no wheezes, rales, or rhonchi. No accessory muscle use. Gastrointestinal: Soft, non-tender, non-distended, BS +, no masses, organomegaly, or guarding present.  GU: Superior left buttock near gluteal cleft abscess s/p I&D without obvious fluctuance, but with minimal purulent drainage from site. Induration and tenderness extending inferiorly along left buttock and gluteal cleft to scrotum. No evidence of scrotal involvement. Neurologic: Awake, alert, oriented. Moving all extremities equally.  Skin: Warm, dry and intact. No rashes. Psychiatric: Normal mood and affect. speech and behavior is normal. Cognition and memory are normal.    (Picture taken from ED note)  CT abdomen/pelvis: Skin thickening and fat edema in the left gluteal and perineal region, extending into the ischiorectal fossa. Mild supralevator fat edema around the rectum and along the left pelvic side wall. Negative for abscess or soft tissue emphysema.   Assessment & Plan by Problem: Principal Problem:   Perirectal cellulitis Active Problems:   AKI (acute kidney injury) ([place])   Tobacco abuse   Cocaine use  Mr. Jeschke is a 56 yo M with PMHx of tobacco abuse who presents to the ED with complaint of severe buttock pain.  Gluteal and Perineal Cellulitis: Patient presented to the ED with a one-week history of painful boil on left buttock. Patient is status post I&D of left buttock abscess. Patient was noncompliant with antibiotic regimen. He is now admitted with gluteal cleft and perineal cellulitis extending to scrotum. No evidence of abscess on CT abdomen and pelvis with contrast. No evidence of obvious fluctuance on examination. Patient is  overall well appearing, afebrile but with leukocytosis. Given his social situation and extension of cellulitis, it is reasonable to admit patient to inpatient for IV antibiotics at this time. There is no indication for surgical involvement at this time, but if patient were to have worsening cellulitis or evidence of subcutaneous air or development of abscess on exam, would consider repeat imaging and involvement of surgical specialty. -Admit to inpatient -Continue IV antibiotic coverage of MRSA, gram-positive, gram-negative and anaerobes. Will continue vancomycin and ceftriaxone and add Flagyl. -Careful wound checks and dressing changes -Repeat CBC tomorrow morning -Aerobic Culture -HIV antibody -Patient will need to be set up a primary care provider. I think he would be a good patient for the internal medicine clinic, will arrange on discharge. -Patient will also need assistance with antibiotics on discharge, we will discuss with our pharmacy team  Elevated creatinine: Creatinine on admission 1.4. No priors. Unclear if patient has an AK I or chronic kidney disease. Patient received normal saline bolus in the emergency department. -Encourage by mouth intake -Repeat BMET tomorrow morning  Tobacco abuse: Patient denies interest in quitting at this time. He smokes 1 pack per day for the past 20 years. -Counsel on tobacco cessation -Nicotine patch  Polysubstance abuse: Patient admits  to occasional cocaine use and alcohol use. He drinks one 40 ounce. 3 times a week. -Counseled on cessation of cocaine use and appropriate alcohol use  DVT/PE prophylaxis: Lovenox subcutaneous daily FEN: Regular diet Code: Full code  Dispo: Admit patient to Inpatient with expected length of stay greater than 2 midnights.  Signed: Martyn Malay, DO PGY-3 Internal Medicine Resident Pager # [phone] 06/09/2016 10:59 AM

## 2016-06-09 NOTE — ED Provider Notes (Signed)
[name] DEPT Provider Note   CSN: [id number] Arrival date & time: 06/09/16  7544     History   Chief Complaint Chief Complaint  Patient presents with  . Abscess    HPI Donald York is a 56 y.o. male.  56 yo M with a chief complaint of an abscess. He was seen 3 days ago with the same. Since then the patient has not changed his bandage or gotten his antibiotics filled. He has not done warm compress. The extent of the abscess has significantly increased. He denies fevers or chills denies nausea or vomiting. He is concerned by the size of the lesion and that it's approaching his testicles.   The history is provided by the patient.  Abscess  Location:  Pelvis Pelvic abscess location:  Perianal Size:  10cm Abscess quality: fluctuance, induration and painful   Red streaking: no   Duration:  10 days Progression:  Worsening Pain details:    Quality:  Burning, sharp and shooting   Severity:  Moderate   Duration:  10 days   Timing:  Constant   Progression:  Worsening Chronicity:  Recurrent Relieved by:  Nothing Worsened by:  Nothing Ineffective treatments:  None tried Associated symptoms: no fever, no headaches and no vomiting     History reviewed. No pertinent past medical history.  There are no active problems to display for this patient.   History reviewed. No pertinent surgical history.     Home Medications    Prior to Admission medications   Medication Sig Start Date End Date Taking? Authorizing Provider  cephALEXin (KEFLEX) 500 MG capsule Take 1 capsule (500 mg total) by mouth 4 (four) times daily. 06/06/16   Deno Etienne, DO  sulfamethoxazole-trimethoprim (BACTRIM DS,SEPTRA DS) 800-160 MG tablet Take 1 tablet by mouth 2 (two) times daily. 06/06/16 06/13/16  Deno Etienne, DO    Family History History reviewed. No pertinent family history.  Social History Social History  Substance Use Topics  . Smoking status: Current Some Day Smoker    Packs/day: 1.00  .  Smokeless tobacco: Never Used  . Alcohol use Yes     Comment: 40 oz beer, every now and then     Allergies   Patient has no known allergies.   Review of Systems Review of Systems  Constitutional: Negative for chills and fever.  HENT: Negative for congestion and facial swelling.   Eyes: Negative for discharge and visual disturbance.  Respiratory: Negative for shortness of breath.   Cardiovascular: Negative for chest pain and palpitations.  Gastrointestinal: Negative for abdominal pain, diarrhea and vomiting.  Musculoskeletal: Negative for arthralgias and myalgias.  Skin: Positive for wound. Negative for color change and rash.  Neurological: Negative for tremors, syncope and headaches.  Psychiatric/Behavioral: Negative for confusion and dysphoric mood.     Physical Exam Updated Vital Signs BP 110/71 (BP Location: Right Arm)   Pulse 83   Temp 98.6 F (37 C) (Oral)   Resp 19   SpO2 99%   Physical Exam  Constitutional: He is oriented to person, place, and time. He appears well-developed and well-nourished.  HENT:  Head: Normocephalic and atraumatic.  Eyes: EOM are normal. Pupils are equal, round, and reactive to light.  Neck: Normal range of motion. Neck supple. No JVD present.  Cardiovascular: Normal rate and regular rhythm.  Exam reveals no gallop and no friction rub.   No murmur heard. Pulmonary/Chest: No respiratory distress. He has no wheezes.  Abdominal: He exhibits no distension and no  mass. There is no tenderness. There is no rebound and no guarding.  Genitourinary:     Musculoskeletal: Normal range of motion.  Neurological: He is alert and oriented to person, place, and time.  Skin: No rash noted. No pallor.  Psychiatric: He has a normal mood and affect. His behavior is normal.  Nursing note and vitals reviewed.    ED Treatments / Results  Labs (all labs ordered are listed, but only abnormal results are displayed) Labs Reviewed  CBC WITH  DIFFERENTIAL/PLATELET - Abnormal; Notable for the following:       Result Value   WBC 18.8 (*)    HCT 38.7 (*)    Neutro Abs 14.6 (*)    Monocytes Absolute 1.5 (*)    All other components within normal limits  BASIC METABOLIC PANEL - Abnormal; Notable for the following:    Glucose, Bld 113 (*)    Creatinine, Ser 1.40 (*)    Calcium 8.3 (*)    GFR calc non Af Amer 55 (*)    All other components within normal limits  I-STAT CHEM 8, ED - Abnormal; Notable for the following:    Creatinine, Ser 1.40 (*)    Glucose, Bld 110 (*)    Calcium, Ion 1.07 (*)    Hemoglobin 12.9 (*)    HCT 38.0 (*)    All other components within normal limits  I-STAT CG4 LACTIC ACID, ED    EKG  EKG Interpretation None       Radiology Ct Pelvis W Contrast  Result Date: 06/09/2016 CLINICAL DATA:  Left buttocks boil.  Evaluate for abscess. EXAM: CT PELVIS WITH CONTRAST TECHNIQUE: Multidetector CT imaging of the pelvis was performed using the standard protocol following the bolus administration of intravenous contrast. CONTRAST:  150mL ISOVUE-300 IOPAMIDOL (ISOVUE-300) INJECTION 61% COMPARISON:  None. FINDINGS: Skin thickening and fat edema in the left gluteal and perineal region, extending into the ischiorectal fossa. Mild supralevator fat edema around the rectum and along the left pelvic side wall. Negative for abscess or soft tissue emphysema. Nodular soft tissue density along the gluteal cleft could be packing or skin tag. This should be readily visualized on exam. Urinary Tract:  No abnormality visualized. Bowel: Unremarkable visualized pelvic bowel loops. Negative appendix Vascular/Lymphatic: No pathologically enlarged lymph nodes. Reproductive:  Negative.  No scrotal wall involvement. Musculoskeletal: No evidence of osseous infection or hip joint effusion. Mild asymmetric swelling of the left obturator interna. IMPRESSION: Left gluteal and perineal cellulitis extending into the left ischiorectal fossa and  supralevator pelvis. Negative for abscess or soft tissue gas. Electronically Signed   By: Monte Fantasia M.D.   On: 06/09/2016 09:01    Procedures Procedures (including critical care time)  Medications Ordered in ED Medications  vancomycin (VANCOCIN) 1,500 mg in sodium chloride 0.9 % 500 mL IVPB (not administered)  cefTRIAXone (ROCEPHIN) 1 g in dextrose 5 % 50 mL IVPB (not administered)  sodium chloride 0.9 % bolus 1,000 mL (1,000 mLs Intravenous New Bag/Given 06/09/16 0801)  morphine 4 MG/ML injection 2 mg (2 mg Intravenous Given 06/09/16 0803)  ondansetron (ZOFRAN) injection 4 mg (4 mg Intravenous Given 06/09/16 0802)  iopamidol (ISOVUE-300) 61 % injection (100 mLs  Contrast Given 06/09/16 0836)     Initial Impression / Assessment and Plan / ED Course  I have reviewed the triage vital signs and the nursing notes.  Pertinent labs & imaging results that were available during my care of the patient were reviewed by me and considered in  my medical decision making (see chart for details).     56 yo M With a chief complaint of a left buttock abscess. Patient is very large extension of the abscess will obtain a CT scan to evaluate for deep space involvement.  CT scan with just diffuse cellulitis. Will start on antibiotics. The patient has a large area affected feel like he likely need to stay overnight in the hospital for further evaluation.  I discussed the antibiotic therapy with the ED pharmacist. Recommending vancomycin and Rocephin.  The patients results and plan were reviewed and discussed.   Any x-rays performed were independently reviewed by myself.   Differential diagnosis were considered with the presenting HPI.  Medications  vancomycin (VANCOCIN) 1,500 mg in sodium chloride 0.9 % 500 mL IVPB (not administered)  cefTRIAXone (ROCEPHIN) 1 g in dextrose 5 % 50 mL IVPB (not administered)  sodium chloride 0.9 % bolus 1,000 mL (1,000 mLs Intravenous New Bag/Given 06/09/16 0801)    morphine 4 MG/ML injection 2 mg (2 mg Intravenous Given 06/09/16 0803)  ondansetron (ZOFRAN) injection 4 mg (4 mg Intravenous Given 06/09/16 0802)  iopamidol (ISOVUE-300) 61 % injection (100 mLs  Contrast Given 06/09/16 0836)    Vitals:   06/09/16 0748 06/09/16 0750  BP: 110/71   Pulse: 83   Resp: 19   Temp:  98.6 F (37 C)  TempSrc:  Oral  SpO2: 99%     Final diagnoses:  Perirectal cellulitis    Admission/ observation were discussed with the admitting physician, patient and/or family and they are comfortable with the plan.    Final Clinical Impressions(s) / ED Diagnoses   Final diagnoses:  Perirectal cellulitis    New Prescriptions New Prescriptions   No medications on file     Deno Etienne, DO 06/09/16 2015

## 2016-06-09 NOTE — ED Notes (Addendum)
Pt transported to CT. VSS prior to leaving department

## 2016-06-09 NOTE — ED Notes (Signed)
Food delivered.

## 2016-06-10 LAB — CBC
HEMATOCRIT: 35.1 % — AB (ref 39.0–52.0)
Hemoglobin: 11.5 g/dL — ABNORMAL LOW (ref 13.0–17.0)
MCH: 28.5 pg (ref 26.0–34.0)
MCHC: 32.8 g/dL (ref 30.0–36.0)
MCV: 87.1 fL (ref 78.0–100.0)
Platelets: 323 10*3/uL (ref 150–400)
RBC: 4.03 MIL/uL — ABNORMAL LOW (ref 4.22–5.81)
RDW: 13.4 % (ref 11.5–15.5)
WBC: 18.1 10*3/uL — AB (ref 4.0–10.5)

## 2016-06-10 LAB — BASIC METABOLIC PANEL
Anion gap: 10 (ref 5–15)
BUN: 10 mg/dL (ref 6–20)
CALCIUM: 7.7 mg/dL — AB (ref 8.9–10.3)
CO2: 22 mmol/L (ref 22–32)
CREATININE: 1.77 mg/dL — AB (ref 0.61–1.24)
Chloride: 104 mmol/L (ref 101–111)
GFR calc Af Amer: 48 mL/min — ABNORMAL LOW (ref >60)
GFR, EST NON AFRICAN AMERICAN: 41 mL/min — AB (ref >60)
GLUCOSE: 111 mg/dL — AB (ref 65–99)
POTASSIUM: 3.8 mmol/L (ref 3.5–5.1)
SODIUM: 136 mmol/L (ref 135–145)

## 2016-06-10 LAB — HIV ANTIBODY (ROUTINE TESTING W REFLEX): HIV SCREEN 4TH GENERATION: NONREACTIVE

## 2016-06-10 NOTE — Progress Notes (Addendum)
   Subjective: Currently, the patient is feeling well. Wound opened up ON and was draining significant purulent, foul smelling discharge. Feels improved following drainage. No fevers.  Objective: Vital signs in last 24 hours: Vitals:   06/09/16 1300 06/09/16 1609 06/09/16 2058 06/10/16 0515  BP: 114/73 (!) 103/57 (!) 98/59 106/63  Pulse: 76 88 86 79  Resp:  18 18 18   Temp:  99 F (37.2 C) 98.6 F (37 C) 98.6 F (37 C)  TempSrc:  Oral Oral   SpO2: 96% 96% 94% 95%  Weight:  176 lb 9.6 oz (80.1 kg)    Height:  6' (1.829 m)     Physical Exam: Physical Exam  Constitutional: No distress.  Cardiovascular: Normal rate, regular rhythm and normal heart sounds.   Pulmonary/Chest: Effort normal and breath sounds normal.  Abdominal: Soft. Bowel sounds are normal. There is no tenderness.  Genitourinary:     Musculoskeletal: He exhibits no edema.   Labs: CBC:  Recent Labs Lab 06/09/16 0750 06/09/16 0802 06/10/16 0513  WBC 18.8*  --  18.1*  NEUTROABS 14.6*  --   --   HGB 13.0 12.9* 11.5*  HCT 38.7* 38.0* 35.1*  MCV 87.2  --  87.1  PLT 289  --  371   Metabolic Panel:  Recent Labs Lab 06/09/16 0750 06/09/16 0802 06/10/16 0513  NA 142 139 136  K 3.7 3.5 3.8  CL 105 103 104  CO2 27  --  22  GLUCOSE 113* 110* 111*  BUN 13 13 10   CREATININE 1.40* 1.40* 1.77*  CALCIUM 8.3*  --  7.7*   Microbiology: Wound culture - Gram pos cocci clusters  Imaging: CT w/o evidence of abcess/gas   Medications: Infusions: . cefTRIAXone (ROCEPHIN)  IV 1 g (06/10/16 0927)  . metronidazole 500 mg (06/10/16 0510)  . vancomycin     Scheduled Medications: . enoxaparin (LOVENOX) injection  40 mg Subcutaneous Q24H  . nicotine  14 mg Transdermal Daily   PRN Medications: acetaminophen **OR** acetaminophen, oxyCODONE  Assessment/Plan: Pt is a 56 y.o. yo male with a PMHx of HTN and substance abuse who was admitted on 06/09/2016 with symptoms of perineal cellulitis. Pt is receiving 48 hrs  of IV antibiotic therapy and observation while awaiting culture data to narrow antibiotics.  Gluteal and Perineal Cellulitis: Draining purulent discharge ON. Gram stain w/ G+ cocci in clusters, suspect staph. Will await speciation to narrow. Continue wound care with changes of dry dressing. - vanc, CTX, flagyl - f/u wound cx - follow fever curve - trend CBC  Elevated creatinine: Creatinine on admission 1.7 today, worse from 1.4. s.p fluid bolus. Unclear etiology. Does not appear dry.  -Encourage by mouth intake -Repeat BMET tomorrow morning  Polysubstance abuse: Patient admits to occasional cocaine use and alcohol use + tobacco use. He drinks one 40 ounce 3 times a week. No h/o withdrawal. -Counseled on cessation of cocaine use and appropriate alcohol use - nicotine patch  Length of Stay: 1 day(s) Dispo: Anticipated discharge in approximately 1 day(s).  Holley Raring, MD Pager: [phone] (7AM-5PM) 06/10/2016, 12:00 PM

## 2016-06-11 DIAGNOSIS — L03115 Cellulitis of right lower limb: Secondary | ICD-10-CM

## 2016-06-11 DIAGNOSIS — F1910 Other psychoactive substance abuse, uncomplicated: Secondary | ICD-10-CM

## 2016-06-11 DIAGNOSIS — N179 Acute kidney failure, unspecified: Secondary | ICD-10-CM

## 2016-06-11 DIAGNOSIS — B9561 Methicillin susceptible Staphylococcus aureus infection as the cause of diseases classified elsewhere: Secondary | ICD-10-CM

## 2016-06-11 LAB — AEROBIC CULTURE W GRAM STAIN (SUPERFICIAL SPECIMEN)

## 2016-06-11 LAB — BASIC METABOLIC PANEL
Anion gap: 8 (ref 5–15)
Anion gap: 8 (ref 5–15)
BUN: 11 mg/dL (ref 6–20)
BUN: 12 mg/dL (ref 6–20)
CHLORIDE: 105 mmol/L (ref 101–111)
CO2: 25 mmol/L (ref 22–32)
CO2: 22 mmol/L (ref 22–32)
CREATININE: 2.12 mg/dL — AB (ref 0.61–1.24)
Calcium: 7.9 mg/dL — ABNORMAL LOW (ref 8.9–10.3)
Calcium: 7.9 mg/dL — ABNORMAL LOW (ref 8.9–10.3)
Chloride: 109 mmol/L (ref 101–111)
Creatinine, Ser: 2.02 mg/dL — ABNORMAL HIGH (ref 0.61–1.24)
GFR calc non Af Amer: 33 mL/min — ABNORMAL LOW (ref >60)
GFR calc non Af Amer: 35 mL/min — ABNORMAL LOW (ref >60)
GFR, EST AFRICAN AMERICAN: 38 mL/min — AB (ref >60)
GFR, EST AFRICAN AMERICAN: 41 mL/min — AB (ref >60)
Glucose, Bld: 110 mg/dL — ABNORMAL HIGH (ref 65–99)
Glucose, Bld: 102 mg/dL — ABNORMAL HIGH (ref 65–99)
POTASSIUM: 3.7 mmol/L (ref 3.5–5.1)
POTASSIUM: 4.3 mmol/L (ref 3.5–5.1)
SODIUM: 138 mmol/L (ref 135–145)
SODIUM: 139 mmol/L (ref 135–145)

## 2016-06-11 LAB — CBC
HEMATOCRIT: 34.1 % — AB (ref 39.0–52.0)
HEMOGLOBIN: 11.3 g/dL — AB (ref 13.0–17.0)
MCH: 28.9 pg (ref 26.0–34.0)
MCHC: 33.1 g/dL (ref 30.0–36.0)
MCV: 87.2 fL (ref 78.0–100.0)
Platelets: 350 10*3/uL (ref 150–400)
RBC: 3.91 MIL/uL — AB (ref 4.22–5.81)
RDW: 13.7 % (ref 11.5–15.5)
WBC: 9.2 10*3/uL (ref 4.0–10.5)

## 2016-06-11 LAB — AEROBIC CULTURE  (SUPERFICIAL SPECIMEN)

## 2016-06-11 MED ORDER — DOXYCYCLINE HYCLATE 100 MG PO TABS
100.0000 mg | ORAL_TABLET | Freq: Two times a day (BID) | ORAL | Status: DC
  Administered 2016-06-11 – 2016-06-12 (×3): 100 mg via ORAL
  Filled 2016-06-11 (×3): qty 1

## 2016-06-11 MED ORDER — SODIUM CHLORIDE 0.9 % IV BOLUS (SEPSIS)
1000.0000 mL | Freq: Once | INTRAVENOUS | Status: AC
  Administered 2016-06-11: 1000 mL via INTRAVENOUS

## 2016-06-11 MED ORDER — SODIUM CHLORIDE 0.9 % IV SOLN
INTRAVENOUS | Status: AC
  Administered 2016-06-11 (×2): via INTRAVENOUS

## 2016-06-11 NOTE — Progress Notes (Addendum)
Subjective: Currently, the patient is feeling well without complaints other than tenderness around the infection in his perineum. He reports that his wound has continued to drain purulent fluid throughout the night but has been less volume than previous night. Patient denies any change in urine output, color, quality of urine.  Objective: Vital signs in last 24 hours: Vitals:   06/10/16 0515 06/10/16 1818 06/10/16 2248 06/11/16 0620  BP: 106/63 108/72 (!) 95/56 109/72  Pulse: 79 81 72 68  Resp: 18 16 18 16   Temp: 98.6 F (37 C) 99 F (37.2 C) 98.4 F (36.9 C) 98.7 F (37.1 C)  TempSrc:  Oral Oral Oral  SpO2: 95% 97% 96% 97%  Weight:      Height:       Physical Exam: Physical Exam  Constitutional: No distress.  Cardiovascular: Normal rate, regular rhythm and normal heart sounds.   Pulmonary/Chest: Effort normal and breath sounds normal.  Abdominal: Soft. Bowel sounds are normal. There is no tenderness.  Genitourinary:     Musculoskeletal: He exhibits no edema.   Labs: CBC:  Recent Labs Lab 06/09/16 0750 06/09/16 0802 06/10/16 0513  WBC 18.8*  --  18.1*  NEUTROABS 14.6*  --   --   HGB 13.0 12.9* 11.5*  HCT 38.7* 38.0* 35.1*  MCV 87.2  --  87.1  PLT 289  --  765   Metabolic Panel:  Recent Labs Lab 06/09/16 0750 06/09/16 0802 06/10/16 0513 06/11/16 0612  NA 142 139 136 138  K 3.7 3.5 3.8 3.7  CL 105 103 104 105  CO2 27  --  22 25  GLUCOSE 113* 110* 111* 110*  BUN 13 13 10 11   CREATININE 1.40* 1.40* 1.77* 2.12*  CALCIUM 8.3*  --  7.7* 7.9*   Microbiology: Wound culture - Gram pos cocci clusters  Imaging: CT w/o evidence of abcess/gas   Medications: Infusions: . cefTRIAXone (ROCEPHIN)  IV 1 g (06/10/16 0927)  . metronidazole Stopped (06/11/16 0522)  . vancomycin     Scheduled Medications: . enoxaparin (LOVENOX) injection  40 mg Subcutaneous Q24H  . nicotine  14 mg Transdermal Daily   PRN Medications: acetaminophen **OR** acetaminophen,  oxyCODONE  Assessment/Plan: Pt is a 56 y.o. yo male with a PMHx of HTN and substance abuse who was admitted on 06/09/2016 with symptoms of perineal cellulitis. Pt is receiving 48 hrs of IV antibiotic therapy and observation while awaiting culture data to narrow antibiotics.  Gluteal and Perineal Cellulitis: Draining purulent discharge. Gram stain w/ G+ cocci in clusters, suspect staph. Awaiting speciation, will d/c IV abx now after 48hrs. Some concern for nephrotoxicity w/ Vanc, will avoid bactrim, prefer Doxy. Can consider Keflex for strep coverage empirically. Continue wound care with changes of dry dressing. - d/c vanc, CTX, flagyl - start PO doxy 100mg  BID x 8d - consider PO keflex - f/u wound cx - follow fever curve - trend CBC  AKI: Creatinine now 2.1 from 1.4-->1.7 since admission. Volume status appears good. Concerned for vanc toxicity vs contrast nephropathy after CT. Will d/c vanc and switch to PO Doxy for coverage against staph. - Give 1L NS bolus to support renal fnx -Repeat BMET PM/AM  Polysubstance abuse: Patient admits to occasional cocaine use and alcohol use + tobacco use. He drinks one 40 ounce 3 times a week. No h/o withdrawal. -Counseled on cessation of cocaine use and appropriate alcohol use - nicotine patch  Length of Stay: 2 day(s) Dispo: Anticipated discharge in approximately 1 day(s).  Holley Raring, MD Pager: [phone] (7AM-5PM) 06/11/2016, 7:38 AM

## 2016-06-12 LAB — BASIC METABOLIC PANEL
Anion gap: 9 (ref 5–15)
BUN: 11 mg/dL (ref 6–20)
CHLORIDE: 109 mmol/L (ref 101–111)
CO2: 23 mmol/L (ref 22–32)
Calcium: 7.9 mg/dL — ABNORMAL LOW (ref 8.9–10.3)
Creatinine, Ser: 2.01 mg/dL — ABNORMAL HIGH (ref 0.61–1.24)
GFR calc non Af Amer: 35 mL/min — ABNORMAL LOW (ref >60)
GFR, EST AFRICAN AMERICAN: 41 mL/min — AB (ref >60)
Glucose, Bld: 102 mg/dL — ABNORMAL HIGH (ref 65–99)
POTASSIUM: 3.9 mmol/L (ref 3.5–5.1)
SODIUM: 141 mmol/L (ref 135–145)

## 2016-06-12 MED ORDER — DOXYCYCLINE HYCLATE 100 MG PO TABS: 100.0000 mg | ORAL_TABLET | Freq: Two times a day (BID) | ORAL | 0 refills | Status: AC

## 2016-06-12 NOTE — Care Management Note (Signed)
Case Management Note  Patient Details  Name: FYNN VANBLARCOM MRN: [id number] Date of Birth: 12/09/1960  Subjective/Objective:   Pt admitted on 06/09/16 with gluteal and perineal cellulitis.  PTA, pt independent of ADLS.  He is uninsured, and has no PCP.                   Action/Plan: Pt is eligible for medication assistance through [facility] program.  Pt given [facility] letter with explanation of benefits.  Pt encouraged to call [facility] ASAP for appointment to establish PCP.  He states he will do this.    Expected Discharge Date:  06/12/16               Expected Discharge Plan:  Home/Self Care  In-House Referral:     Discharge planning Services  CM Consult, [facility], [facility]  Post Acute Care Choice:    Choice offered to:     DME Arranged:    DME Agency:     HH Arranged:    HH Agency:     Status of Service:  Completed, signed off  If discussed at [employer], dates discussed:    Additional Comments:  Reinaldo Raddle, RN, BSN  Trauma/Neuro ICU Case Manager [phone]

## 2016-06-12 NOTE — Discharge Summary (Signed)
Name: Donald York MRN: [id number] DOB: 01-Jun-1960 56 y.o. PCP: No Pcp Per Patient  Date of Admission: 06/09/2016  7:17 AM Date of Discharge: 06/12/2016 Attending Physician: Lucious Groves, DO  Discharge Diagnosis: Principal Problem:   Perirectal cellulitis Active Problems:   AKI (acute kidney injury) ([place])   Tobacco abuse   Cocaine use   Discharge Medications: Allergies as of 06/12/2016   No Known Allergies     Medication List    TAKE these medications   doxycycline 100 MG tablet Commonly known as:  VIBRA-TABS Take 1 tablet (100 mg total) by mouth every 12 (twelve) hours.       Disposition and follow-up:   Mr.Donald York was discharged from [facility] in Good condition.  At the hospital follow up visit please address:  1.  Perineal Cellulitis: evaluate resolution of infection and compliance with Abx. AKI: recheck SCr to ensure resolving nephrotoxicity  2.  Labs / imaging needed at time of follow-up: BMP  Follow-up Appointments: Follow-up Information    [place]. Go on 06/16/2016.   Why:  Call for a hospital follow up appointment. Contact information: [place] [id number] [facility] Course by problem list: Principal Problem:   Perirectal cellulitis Active Problems:   AKI (acute kidney injury) ([place])   Tobacco abuse   Cocaine use   1. Perineal cellulitis 2/2 MRSA infection c/b Vancomycin nephrotoxicity: Pt presented with pain, swelling and purulent discharge in his perineum. He was recently seen in the ED for similar complaints and underwent I&D and was discharged on antibiotics but did not fill these due to his homeless status and financial situation. He had a CT scan which was negative for abscess or subcutaneous gas and did not show scrotal involvement. Pt was treated with 48hrs of broad spectrum antibiotics with Vanc, CTX, flagyl and his initial  leukocytosis began to resolve. His wound culture grew MRSA sensitive to Doxycycline and he was transitioned to oral Doxy on HD 3. His serum creatinine had been rising and was thought likely secondary to Vancomycin nephrotoxicity. After discontinuation of Vanc, SCr began to trend down. He was discharged on a completion of 10 day abx course and follow up with PCP.  Discharge Vitals:   BP 112/72 (BP Location: Left Arm)   Pulse 61   Temp 98.2 F (36.8 C) (Oral)   Resp 18   Ht 6' (1.829 m)   Wt 176 lb 9.6 oz (80.1 kg)   SpO2 100%   BMI 23.95 kg/m   Pertinent Labs, Studies, and Procedures:  Recent Labs Lab 06/09/16 0750 06/09/16 0802 06/10/16 0513 06/11/16 0909  HGB 13.0 12.9* 11.5* 11.3*  HCT 38.7* 38.0* 35.1* 34.1*  WBC 18.8*  --  18.1* 9.2  PLT 289  --  323 350    Recent Labs Lab 06/09/16 0802 06/10/16 0513 06/11/16 0612 06/11/16 1642 06/12/16 0450  CREATININE 1.40* 1.77* 2.12* 2.02* 2.01*   Procedures Performed:  Ct Pelvis W Contrast Result Date: 06/09/2016 IMPRESSION: Left gluteal and perineal cellulitis extending into the left ischiorectal fossa and supralevator pelvis. Negative for abscess or soft tissue gas.   Discharge Instructions: Discharge Instructions    Call MD for:  redness, tenderness, or signs of infection (pain, swelling, redness, odor or green/yellow discharge around incision site)    Complete by:  As directed    Call MD for:  severe uncontrolled  pain    Complete by:  As directed    Call MD for:  temperature >100.4    Complete by:  As directed    Diet - low sodium heart healthy    Complete by:  As directed    Discharge instructions    Complete by:  As directed    You have an infection which we are treating with antibiotics. It is VERY important that you pick up your prescription and take all of it to prevent the infection from returning or worsening. You should also have a follow up visit with the [facility] to have a doctor  evaluate the infection and healing.   Increase activity slowly    Complete by:  As directed       Signed: Holley Raring, MD 06/12/2016, 9:04 AM   Pager: [phone]

## 2016-06-12 NOTE — Progress Notes (Signed)
   Subjective: Currently, the patient is feeling well without complaints other than tenderness around the infection in his perineum. He reports that his wound has continued to drain purulent fluid throughout the night but has been less volume than previous night. Patient denies any change in urine output, color, quality of urine.  Objective: Vital signs in last 24 hours: Vitals:   06/11/16 0620 06/11/16 1419 06/11/16 2236 06/12/16 0521  BP: 109/72 104/63 114/69 112/72  Pulse: 68 63 65 61  Resp: 16 20 18 18   Temp: 98.7 F (37.1 C) 98.2 F (36.8 C) 98.5 F (36.9 C) 98.2 F (36.8 C)  TempSrc: Oral Oral Oral Oral  SpO2: 97% 98% 99% 100%  Weight:      Height:       Physical Exam: Physical Exam  Constitutional: No distress.  Cardiovascular: Normal rate, regular rhythm and normal heart sounds.   Pulmonary/Chest: Effort normal and breath sounds normal.  Abdominal: Soft. Bowel sounds are normal. There is no tenderness.  Genitourinary:     Musculoskeletal: He exhibits no edema.   Labs: CBC:  Recent Labs Lab 06/09/16 0750 06/09/16 0802 06/10/16 0513 06/11/16 0909  WBC 18.8*  --  18.1* 9.2  NEUTROABS 14.6*  --   --   --   HGB 13.0 12.9* 11.5* 11.3*  HCT 38.7* 38.0* 35.1* 34.1*  MCV 87.2  --  87.1 87.2  PLT 289  --  948 546   Metabolic Panel:  Recent Labs Lab 06/09/16 0750 06/09/16 0802 06/10/16 0513 06/11/16 0612 06/11/16 1642 06/12/16 0450  NA 142 139 136 138 139 141  K 3.7 3.5 3.8 3.7 4.3 3.9  CL 105 103 104 105 109 109  CO2 27  --  22 25 22 23   GLUCOSE 113* 110* 111* 110* 102* 102*  BUN 13 13 10 11 12 11   CREATININE 1.40* 1.40* 1.77* 2.12* 2.02* 2.01*  CALCIUM 8.3*  --  7.7* 7.9* 7.9* 7.9*   Microbiology: Wound culture - MRSA  Imaging: CT w/o evidence of abcess/gas   Medications: Infusions:  Scheduled Medications: . doxycycline  100 mg Oral Q12H  . enoxaparin (LOVENOX) injection  40 mg Subcutaneous Q24H  . nicotine  14 mg Transdermal Daily    PRN Medications: acetaminophen **OR** acetaminophen, oxyCODONE  Assessment/Plan: Pt is a 56 y.o. yo male with a PMHx of HTN and substance abuse who was admitted on 06/09/2016 with symptoms of perineal cellulitis.  Gluteal and Perineal Cellulitis: Growing MRSA sensative to Doxy. Continue for 10 d total course. Leukocytosis resolved. - PO doxy 100mg  BID x 7d  AKI: Creatinine 1.4-->1.7-->2.1-->2.0. Suspect vancomycine toxicity, now d/c'd and should continue to resolve. - should have outpt f/u of SCr  Polysubstance abuse: Patient admits to occasional cocaine use and alcohol use + tobacco use. He drinks one 40 ounce 3 times a week. No h/o withdrawal. -Counseled on cessation of cocaine use and appropriate alcohol use - nicotine patch  Length of Stay: 3 day(s) Dispo: Anticipated discharge today.  Holley Raring, MD Pager: [phone] (7AM-5PM) 06/12/2016, 9:51 AM

## 2016-06-12 NOTE — Progress Notes (Signed)
CSW received consult regarding homeless issues. CSW spoke with patient. Patient presented in a good mood and stated he was ready to be discharged. When asked where he would discharge to, patient responded "I will find me a spot and have a smoke and a beer." Patient reported that he would take the bus after picking up his medications next door. Patient denied needing any resources at this time.   CSW signing off.  Percell Locus Karron Goens LCSWA [phone]

## 2016-07-16 LAB — GLUCOSE, POCT (MANUAL RESULT ENTRY): POC Glucose: 136 mg/dl — AB (ref 70–99)

## 2016-07-31 NOTE — Congregational Nurse Program (Signed)
Congregational Nurse Program Note  Date of Encounter: 07/07/2016  Past Medical History: Past Medical History:  Diagnosis Date  . Family history of adverse reaction to anesthesia    " [name] "  . Peri-rectal abscess 06/09/2016    Encounter Details:     CNP Questionnaire - 07/07/16 1731      Patient Demographics   Is this a new or existing patient? New   Patient is considered a/an Not Applicable   Race African-American/Black     Patient Assistance   Location of Patient Assistance Not Applicable   Patient's financial/insurance status Low Income;Self-Pay (Uninsured)   Uninsured Patient ([facility] Card/Care Connects) Yes   Interventions Counseled to make appt. with provider   Patient referred to apply for the following financial assistance [place] insecurities addressed Not Applicable   Transportation assistance No   Assistance securing medications No   Doctor, hospital the healthcare system     Encounter Details   Primary purpose of visit [place]   Was an Emergency Department visit averted? Not Applicable   Does patient have a medical provider? No   Patient referred to Clinic   Was a mental health screening completed? (GAINS tool) No   Does patient have dental issues? No   Does patient have vision issues? No   Does your patient have an abnormal blood pressure today? No   Since previous encounter, have you referred patient for abnormal blood pressure that resulted in a new diagnosis or medication change? No   Does your patient have an abnormal blood glucose today? No   Since previous encounter, have you referred patient for abnormal blood glucose that resulted in a new diagnosis or medication change? No   Was there a life-saving intervention made? No      Client requesting assistance with dental care.  States has an orange card but needs to renew.  Instructed him to renew his orange card and come see  me at the [facility] with his orange card and I can make the referral

## 2016-09-08 ENCOUNTER — Emergency Department (HOSPITAL_COMMUNITY)
Admission: EM | Admit: 2016-09-08 | Discharge: 2016-09-08 | Disposition: A | Discharge status: Home / Self Care | Payer: Self-pay | Attending: Emergency Medicine | Admitting: Emergency Medicine

## 2016-09-08 ENCOUNTER — Encounter (HOSPITAL_COMMUNITY): Payer: Self-pay | Admitting: *Deleted

## 2016-09-08 DIAGNOSIS — F17210 Nicotine dependence, cigarettes, uncomplicated: Secondary | ICD-10-CM | POA: Insufficient documentation

## 2016-09-08 DIAGNOSIS — R21 Rash and other nonspecific skin eruption: Secondary | ICD-10-CM

## 2016-09-08 MED ORDER — SULFAMETHOXAZOLE-TRIMETHOPRIM 800-160 MG PO TABS: 1.0000 | ORAL_TABLET | Freq: Two times a day (BID) | ORAL | 0 refills | Status: AC

## 2016-09-08 MED ORDER — CEPHALEXIN 500 MG PO CAPS
500.0000 mg | ORAL_CAPSULE | Freq: Four times a day (QID) | ORAL | 0 refills | Status: DC
Start: 2016-09-08 — End: 2017-08-14

## 2016-09-08 NOTE — ED Triage Notes (Signed)
Pt states he has a spider bite to back of head, though he does not remember spider biting him.

## 2016-09-08 NOTE — ED Provider Notes (Signed)
[place] Emergency Department Provider Note  ED Clinical Impression  Rash  History   Chief Complaint Insect Bite   HPI  Patient is a 56 y.o. male with a PMH of peri-rectal abscess who presents to ED with concern for rash to back of his head, concerned that he may have been bitten by a spider, initially noticed 3 days ago, but since then increase in pain and redness to area. Denies bleeding or drainage from site. Denies hx of MRSA. Patient states he did not visualize spider or insect. Denies fevers, chills, unexplained weight loss, dizziness, vision or gait changes, headaches, neck pain, CP, SOB, cough, abd pain, n/v/d, dysuria, extremity numbness or tingling, extremity weakness, or any additional concerns. No new soaps/lotions/detergents/shampoos, pets in house, family with similar symptoms, recent landscape work, or additional known exposures. No anticoagulant use. Denies IVDU.   Past Medical History:  Diagnosis Date  . Family history of adverse reaction to anesthesia    " [name] "  . Peri-rectal abscess 06/09/2016    Past Surgical History:  Procedure Laterality Date  . NO PAST SURGERIES      Allergies Patient has no known allergies.  No family history on file.  Social History Social History  Substance Use Topics  . Smoking status: Current Some Day Smoker    Packs/day: 0.50    Types: Cigarettes  . Smokeless tobacco: Never Used  . Alcohol use Yes     Comment: 40 oz beer, every now and then    Review of Systems  Constitutional: Negative for fever, chills, or unexplained weight loss. Eyes: Negative for visual changes. ENT: Negative for nasal congestion, ear pain, or sore throat. Cardiovascular: Negative for chest pain, palpitations, or extremity swelling. Respiratory: Negative for shortness of breath or cough. Gastrointestinal: Negative for abdominal pain, nausea, vomiting, or diarrhea. Musculoskeletal: Negative for back pain or extremity pain/swelling. Skin: +  rash. Neurological: Negative for headaches, dizziness, focal weakness, or numbness/tingling.  Physical Exam   VITAL SIGNS:   ED Triage Vitals  Enc Vitals Group     BP 09/08/16 0757 120/68     Pulse Rate 09/08/16 0753 71     Resp 09/08/16 0753 17     Temp 09/08/16 0753 97.8 F (36.6 C)     Temp Source 09/08/16 0753 Oral     SpO2 09/08/16 0753 100 %     Weight 09/08/16 0756 176 lb (79.8 kg)     Height 09/08/16 0756 6' (1.829 m)     Head Circumference --      Peak Flow --      Pain Score 09/08/16 0756 9     Pain Loc --      Pain Edu? --      Excl. in [place]? --     Constitutional: Alert and oriented. Well appearing and in no respiratory apparent distress. Eyes: PERRL, EOMI, Conjunctivae normal ENT      Head: +1cm x 1 cm area of erythema to occipital scalp, no fluctuance, no surrounding induration. No active bleeding or drainage. No central clearing. Normocephalic and atraumatic.      Ears: TM intact bilaterally without erythema or effusion, no hemotympanum, external ear canals normal.       Nose: No congestion.      Mouth/Throat: Mucous membranes are moist. Oropharynx without erythema or exudate. No trismus. Normal voice, handling secretions normally.      Neck: Supple, no nuchal signs, full active ROM of neck.  Hematological/Lymphatic/Immunological: No cervical lymphadenopathy. Cardiovascular:  Normal S1 S2, regular rhythm, normal rate. No murmur. Normal and symmetric distal pulses are present in all extremities. Respiratory: Breath sounds clear and equal bilaterally. No wheezes, rales, or rhonchi. Normal respiratory effort.  Gastrointestinal: Abdomen soft and nontender. No rebound or guarding. There is no CVA tenderness. Back: No midline tenderness, no stepoff.  Musculoskeletal: Nontender with normal range of motion in all extremities. Neurologic: Speech clear. Alert and appropriate, no gross focal neurologic deficits are appreciated. Gait steady with ambulation. Equal strength in  all four extremities. Extremities neurovascularly intact.  Skin: Skin is warm, dry, and intact. Psychiatric: Mood and affect are normal. Speech and behavior are normal.  Labs   Labs Reviewed - No data to display  Radiology   No orders to display     ED Course, Assessment and Plan   Pt is a 56 y/o M, afebrile, who presents to ED with concern for insect bite to occipital scalp, concern for folliculitis vs. cellulitis. No fluctuance, no surrounding induration. Educational bedside ultrasound without fluid collection of area. Patient afebrile, not c/w sepsis. No petechia or purpura, no involvement of mucous membranes, no involvement of palms or soles of feet. Doubt Celene Kras Syndrome, Toxic Epidermal Necrolysis, staph scalded skin syndrome, RMSF, syphilis, Lyme Disease, subcutaneous abscess, or necrotizing soft tissue infection. Denies hx of MRSA. Will plan to dc with antibiotics and PCP follow up for re-eval; patient amenable to this plan.   8:37 AM Discussed results, discharge instructions, rx and safety, return precautions, and follow up. Pt verbalizes understanding using verbal teachback and agrees with plan, denies any additional concerns.   Previous chart, nursing notes, and vital signs reviewed.    Pertinent labs & imaging results that were available during my care of the patient were reviewed by me and considered in my medical decision making (see chart for details).     Wojeck, Bernadene Bell, NP 09/08/16 1621    Duffy Bruce, MD 09/09/16 1438

## 2016-09-08 NOTE — Discharge Instructions (Signed)
You were seen in the emergency department with concern for insect bite. It appears as if you have mild cellulitis to the area. Please take full course of Keflex and Bactrim (antibiotics) as prescribed. Please take Tylenol as needed for pain--follow over the counter label instructions. Apply warm compresses to area for 20 minutes four times a day. Follow up with your primary care doctor in 3 days for re-evaluation. Return to the ER if you experience fevers, chills, unexplained weight loss, dizziness, visio or gait changes, headaches, neck pain, chest pain, shortness of breath, abdominal pain, nausea, vomiting, diarrhea, extremity numbness/tingling/weakness, increased redness/swelling/warmth/pain to area, red streaking up head, bleeding or drainage, worsening symptoms, or any additional concerns.

## 2016-09-09 MED FILL — SULFAMETHOXAZOLE/TMP DS TAB: 800-160 | 7 days supply | Qty: 14 | Fill #0

## 2016-09-09 MED FILL — CEPHALEXIN 500 MG CAPSULE: 500 | 5 days supply | Qty: 20 | Fill #0

## 2016-09-17 NOTE — Congregational Nurse Program (Signed)
Congregational Nurse Program Note  Date of Encounter: 09/11/2016  Past Medical History: Past Medical History:  Diagnosis Date  . Family history of adverse reaction to anesthesia    " [name] "  . Peri-rectal abscess 06/09/2016    Encounter Details:     CNP Questionnaire - 09/11/16 1632      Patient Demographics   Is this a new or existing patient? Existing   Patient is considered a/an Not Applicable   Race African-American/Black     Patient Assistance   Location of Patient Assistance Not Applicable   Patient's financial/insurance status Self-Pay (Uninsured);Low Income   Uninsured Patient ([facility] Oncologist) Yes   Interventions Not Applicable   Patient referred to apply for the following financial assistance Not Applicable   Food insecurities addressed Not Applicable   Transportation assistance No   Assistance securing medications No   Educational health offerings Acute disease;Medications     Encounter Details   Primary purpose of visit Acute Illness/Condition Visit;Post ED/Hospitalization Visit   Was an Emergency Department visit averted? Not Applicable   Does patient have a medical provider? No   Patient referred to Clinic   Was a mental health screening completed? (GAINS tool) No   Does patient have dental issues? No   Does patient have vision issues? No   Does your patient have an abnormal blood pressure today? No   Since previous encounter, have you referred patient for abnormal blood pressure that resulted in a new diagnosis or medication change? No   Does your patient have an abnormal blood glucose today? No   Since previous encounter, have you referred patient for abnormal blood glucose that resulted in a new diagnosis or medication change? No   Was there a life-saving intervention made? No      Antibiotic filled at [facility] and delivered to client

## 2016-09-17 NOTE — Congregational Nurse Program (Signed)
Congregational Nurse Program Note  Date of Encounter: 09/08/2016  Past Medical History: Past Medical History:  Diagnosis Date  . Family history of adverse reaction to anesthesia    " [name] "  . Peri-rectal abscess 06/09/2016    Encounter Details:     CNP Questionnaire - 09/10/16 1600      Patient Demographics   Is this a new or existing patient? Existing   Patient is considered a/an Not Applicable   Race African-American/Black     Patient Assistance   Location of Patient Assistance Not Applicable   Patient's financial/insurance status Self-Pay (Uninsured);Low Income   Patient referred to apply for the following financial assistance Not Applicable   Food insecurities addressed Not Applicable   Transportation assistance No   Assistance securing medications Yes   Type of Assistance [facility]   Educational health offerings Acute disease;Medications     Encounter Details   Primary purpose of visit Acute Illness/Condition Visit;Post ED/Hospitalization Visit   Was an Emergency Department visit averted? Not Applicable   Does patient have a medical provider? No   Patient referred to Clinic   Was a mental health screening completed? (GAINS tool) No   Does patient have dental issues? No   Does patient have vision issues? No   Does your patient have an abnormal blood pressure today? No   Since previous encounter, have you referred patient for abnormal blood pressure that resulted in a new diagnosis or medication change? No   Does your patient have an abnormal blood glucose today? No   Since previous encounter, have you referred patient for abnormal blood glucose that resulted in a new diagnosis or medication change? No   Was there a life-saving intervention made? No     Was seen in the ED for a rash to the back of the head.  Was prescribed anti-biotics and needed assistance with getting them filled.  Will take to [facility] and deliver to client

## 2017-04-10 ENCOUNTER — Other Ambulatory Visit: Payer: Self-pay

## 2017-04-10 ENCOUNTER — Emergency Department (HOSPITAL_COMMUNITY): Payer: Self-pay

## 2017-04-10 ENCOUNTER — Emergency Department (HOSPITAL_COMMUNITY)
Admission: EM | Admit: 2017-04-10 | Discharge: 2017-04-10 | Disposition: A | Discharge status: Home / Self Care | Payer: Self-pay | Attending: Emergency Medicine | Admitting: Emergency Medicine

## 2017-04-10 ENCOUNTER — Encounter (HOSPITAL_COMMUNITY): Payer: Self-pay | Admitting: *Deleted

## 2017-04-10 DIAGNOSIS — M5432 Sciatica, left side: Secondary | ICD-10-CM | POA: Insufficient documentation

## 2017-04-10 DIAGNOSIS — M5431 Sciatica, right side: Secondary | ICD-10-CM | POA: Insufficient documentation

## 2017-04-10 DIAGNOSIS — F17210 Nicotine dependence, cigarettes, uncomplicated: Secondary | ICD-10-CM | POA: Insufficient documentation

## 2017-04-10 IMAGING — CR DG LUMBAR SPINE COMPLETE 4+V
5 series · 5 of 5 positions shown · non-contrast
Comparison: None.

CLINICAL DATA: Chronic low back and bilateral leg pain. No known
injury.

EXAM:
LUMBAR SPINE - COMPLETE 4+ VIEW

[l-spine ap]
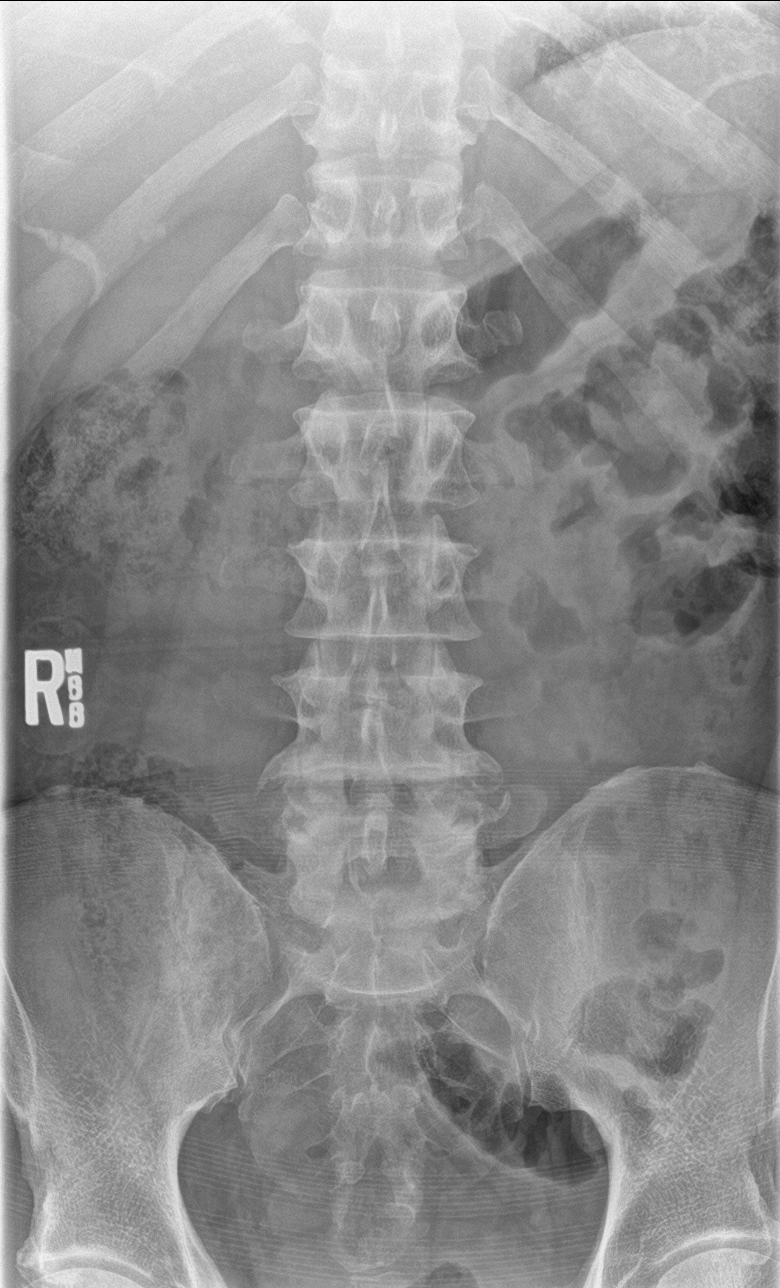

[l-spine obl (1 of 2)]
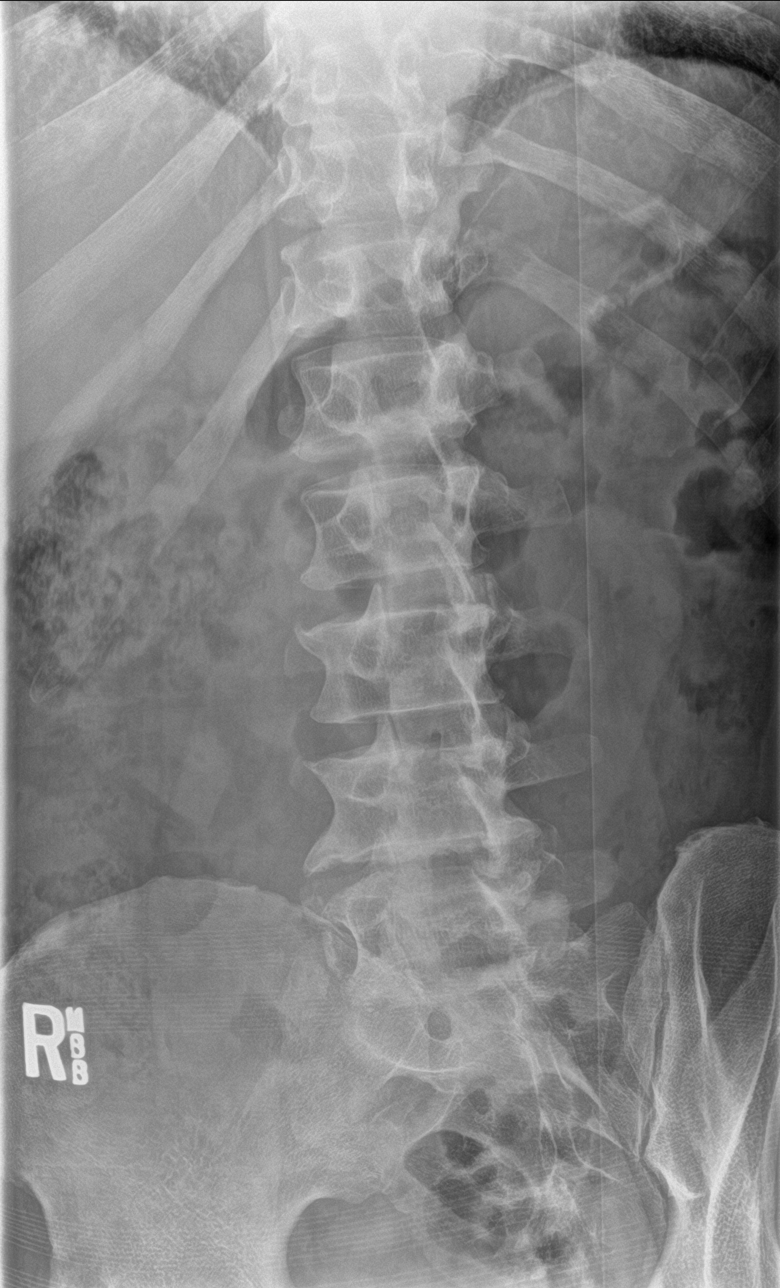

[l-spine obl (2 of 2)]
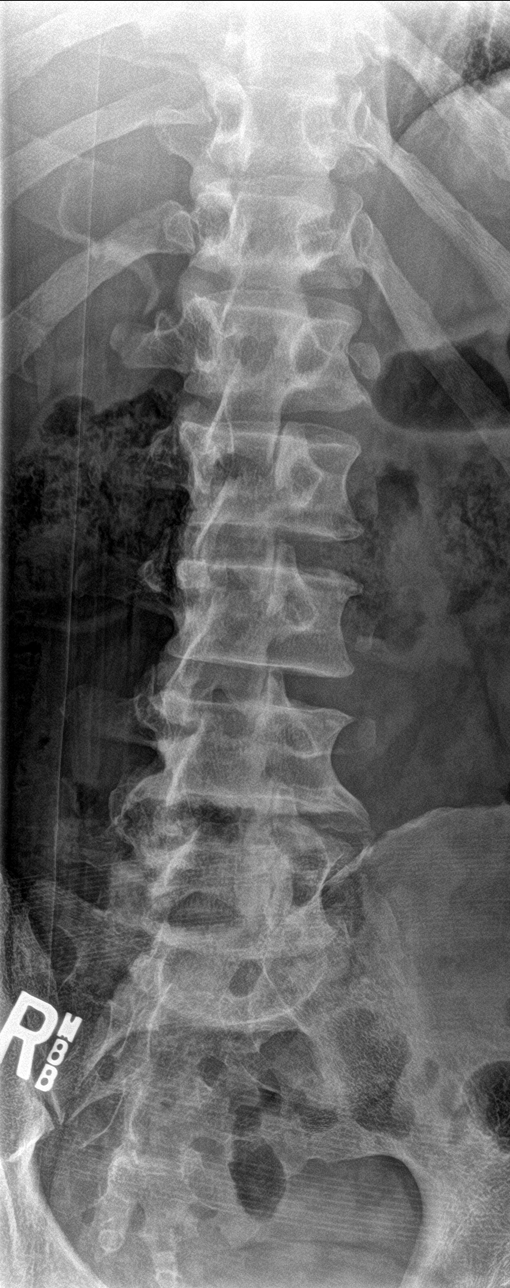

[l-spine lat]
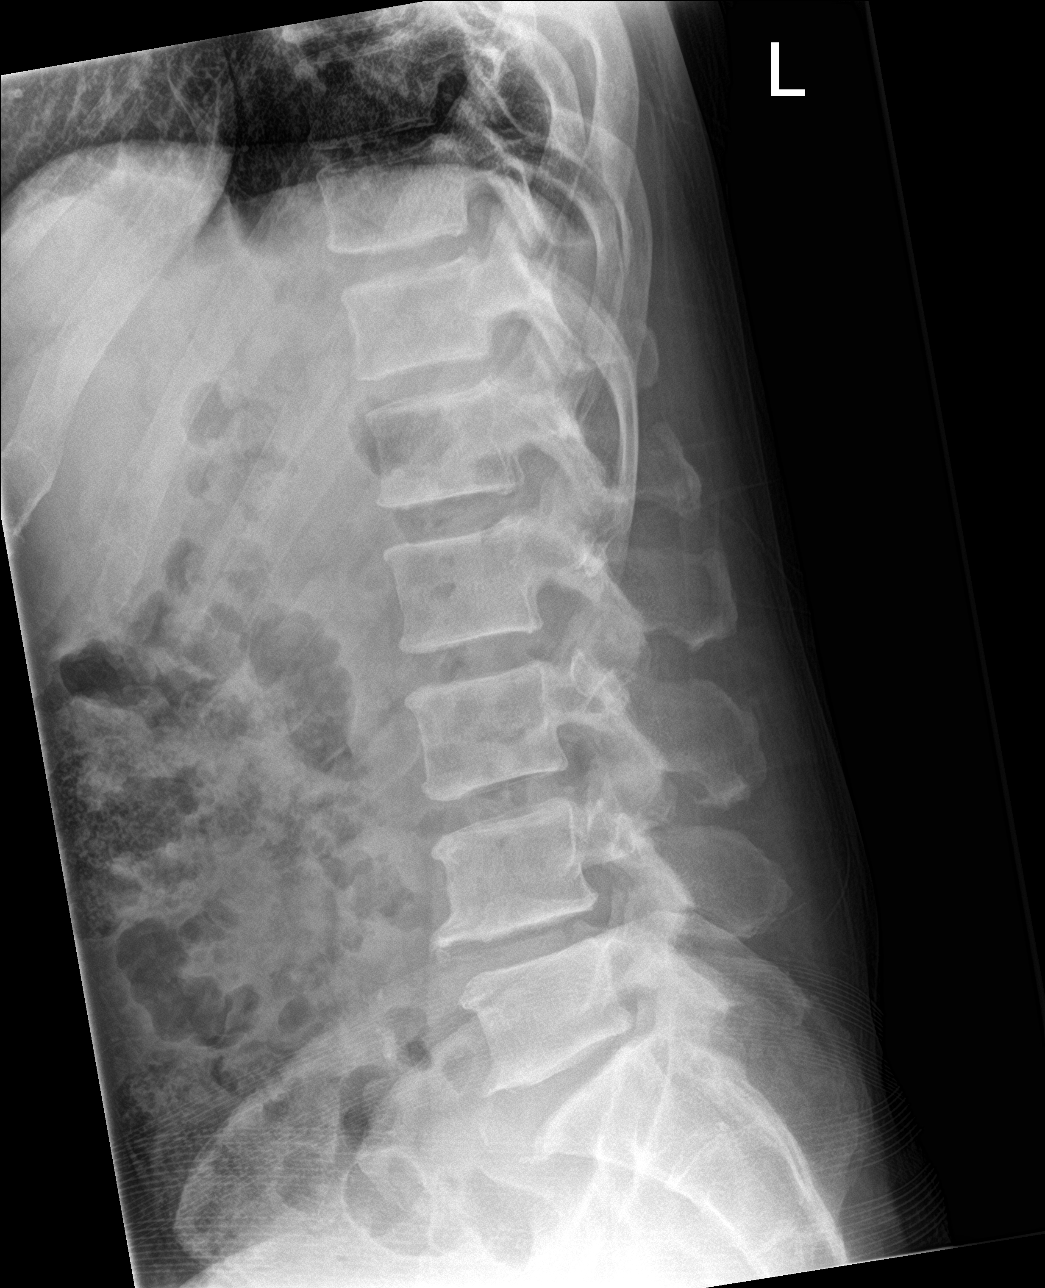

[l-spine spot]
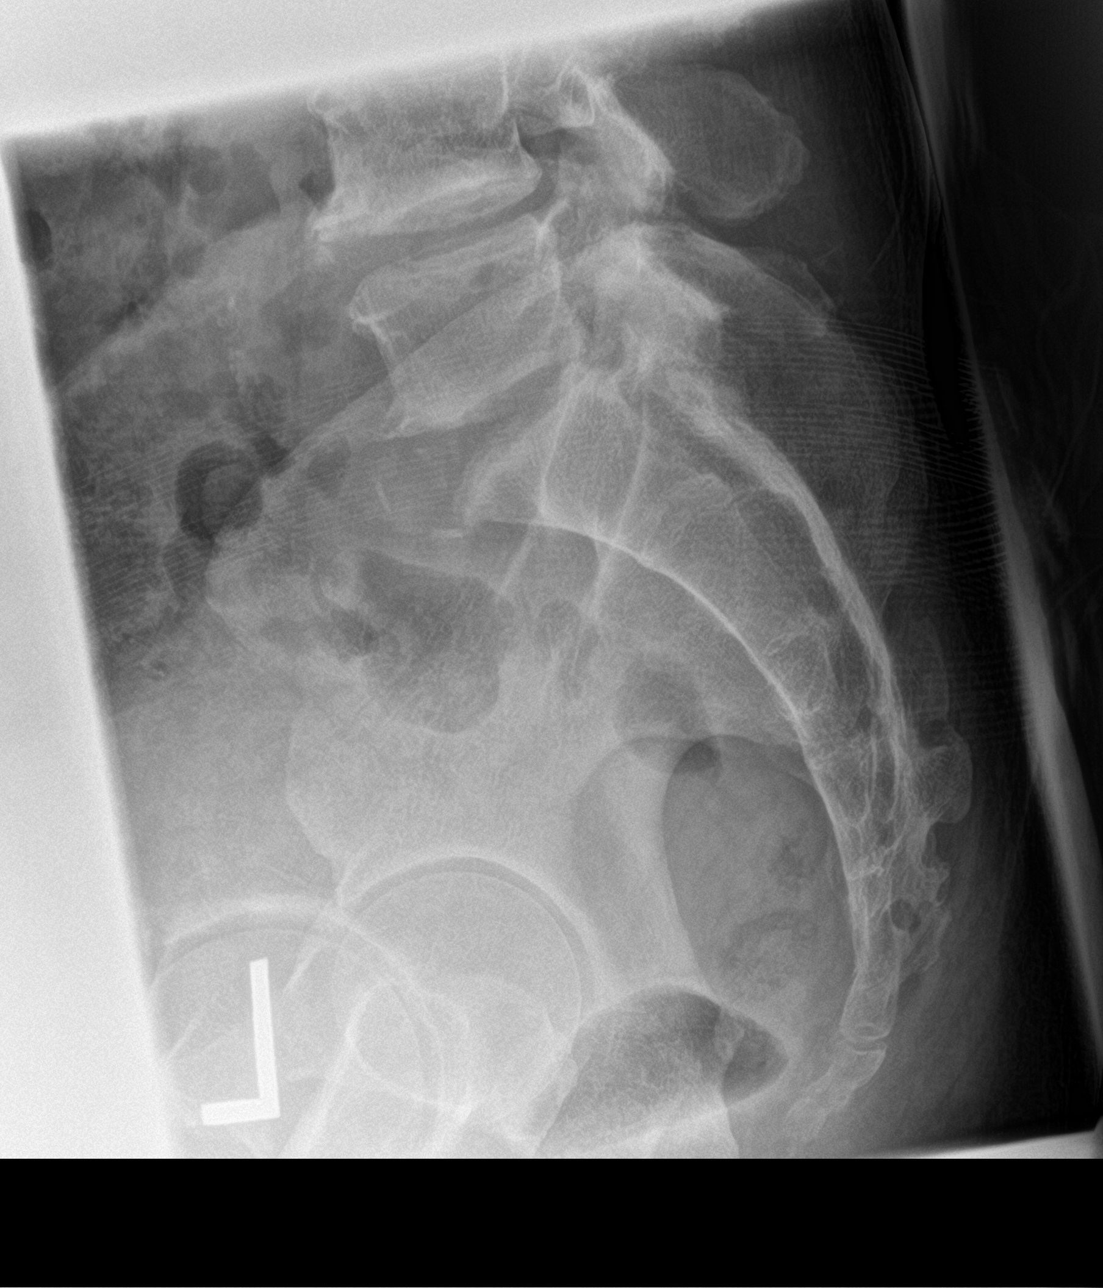

[5 of 5 positions shown; findings below may reference images not displayed]

FINDINGS: There straightening of the normal lumbar lordosis. Intervertebral
disc space height is maintained. Mild endplate spurring is most
notable anteriorly at L4-5 and L5-S1. No pars interarticularis
defect. Paraspinous structures are unremarkable.
IMPRESSION: Mild anterior endplate spurring L4-5 and L5-S1. The exam is
otherwise negative.

## 2017-04-10 IMAGING — CR DG PELVIS 1-2V
1 series · 1 of 1 positions shown · non-contrast
Comparison: CT scan of the pelvis dated [DATE]

CLINICAL DATA: Chronic progressive bilateral hip and leg pain.

EXAM:
PELVIS - 1-2 VIEW

[pelvis ap]
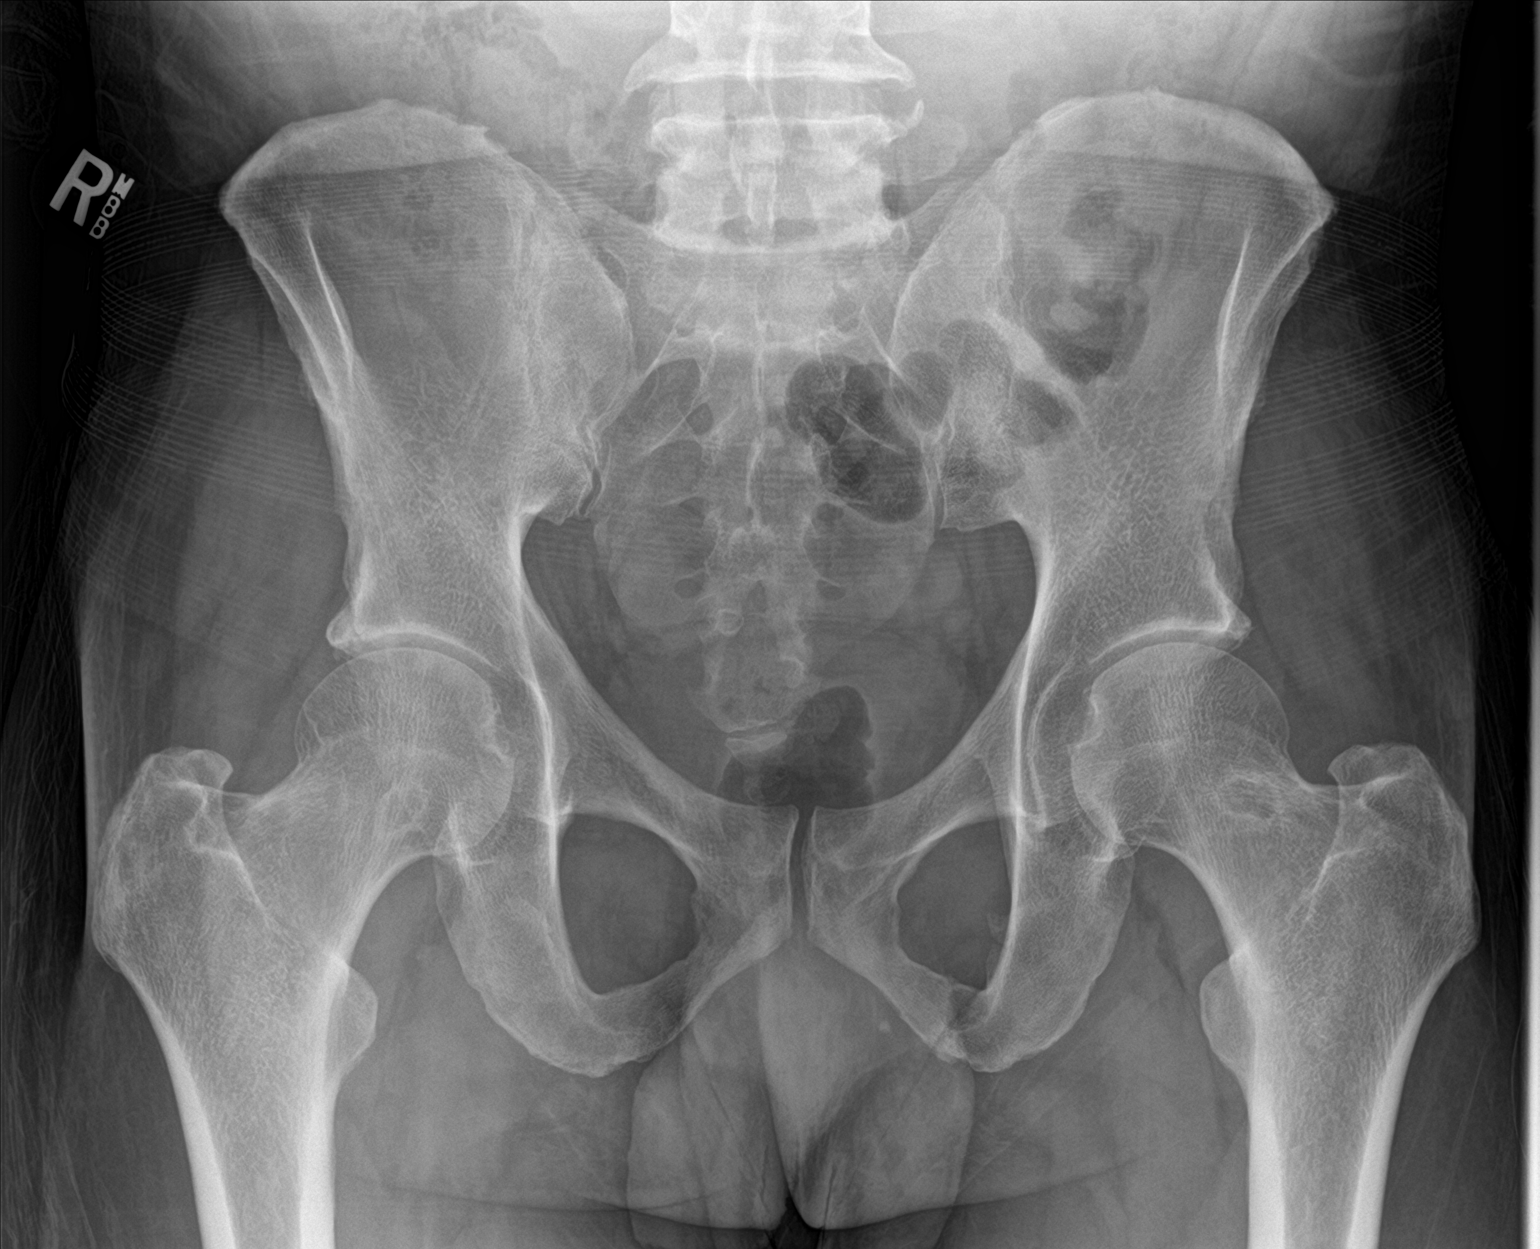

[1 of 1 positions shown; findings below may reference images not displayed]

FINDINGS: Pelvic bones appear normal. Benign synovial herniation pits in both
femoral necks, unchanged. The bones are otherwise normal.
IMPRESSION: No significant abnormality.

## 2017-04-10 MED ORDER — PREDNISONE 10 MG PO TABS: ORAL_TABLET | ORAL | 0 refills | Status: DC

## 2017-04-10 MED ORDER — METHOCARBAMOL 500 MG PO TABS: 500.0000 mg | ORAL_TABLET | Freq: Two times a day (BID) | ORAL | 0 refills | Status: DC

## 2017-04-10 NOTE — ED Triage Notes (Signed)
Pt in c/o chronic bilateral leg pain, pain starts in hips and runs down into ankles, states pain has been going on "for awhile", worse in the last few days, no new injury

## 2017-04-10 NOTE — ED Provider Notes (Signed)
[place] EMERGENCY DEPARTMENT Provider Note   CSN: [id number] Arrival date & time: 04/10/17  7169     History   Chief Complaint Chief Complaint  Patient presents with  . Leg Pain    HPI Donald York is a 57 y.o. male.  The history is provided by the patient. No language interpreter was used.  Leg Pain   This is a new problem. The problem occurs constantly. The problem has been gradually worsening. The pain is present in the left hip and right hip. The quality of the pain is described as aching. The pain is at a severity of 6/10. The pain is moderate. He has tried nothing for the symptoms. The treatment provided no relief. There has been no history of extremity trauma.  Pt complains of pain to back of both hips and down legs.  Pt reports he has had for a year but pain is getting worse.  Pt points down legs at area of sciatic nerve.   Past Medical History:  Diagnosis Date  . Family history of adverse reaction to anesthesia    " [name] "  . Peri-rectal abscess 06/09/2016    Patient Active Problem List   Diagnosis Date Noted  . Perirectal cellulitis 06/09/2016  . AKI (acute kidney injury) ([place]) 06/09/2016  . Tobacco abuse 06/09/2016  . Cocaine use 06/09/2016    Past Surgical History:  Procedure Laterality Date  . NO PAST SURGERIES         Home Medications    Prior to Admission medications   Medication Sig Start Date End Date Taking? Authorizing Provider  cephALEXin (KEFLEX) 500 MG capsule Take 1 capsule (500 mg total) by mouth 4 (four) times daily. 09/08/16   Wojeck, Bernadene Bell, NP  methocarbamol (ROBAXIN) 500 MG tablet Take 1 tablet (500 mg total) by mouth 2 (two) times daily. 04/10/17   Fransico Meadow, PA-C  predniSONE (DELTASONE) 10 MG tablet 6,5,4,3,2,1 taper 04/10/17   Fransico Meadow, PA-C    Family History History reviewed. No pertinent family history.  Social History Social History   Tobacco Use  . Smoking status: Current Some Day  Smoker    Packs/day: 0.50    Types: Cigarettes  . Smokeless tobacco: Never Used  Substance Use Topics  . Alcohol use: Yes    Comment: 40 oz beer, every now and then  . Drug use: Yes    Types: Cocaine, Marijuana     Allergies   Patient has no known allergies.   Review of Systems Review of Systems  All other systems reviewed and are negative.    Physical Exam Updated Vital Signs BP 100/61 (BP Location: Right Arm)   Pulse 64   Temp 98.2 F (36.8 C) (Oral)   Resp 18   Ht 6' (1.829 m)   Wt 80.3 kg (177 lb)   SpO2 99%   BMI 24.01 kg/m   Physical Exam  Constitutional: He appears well-developed and well-nourished.  HENT:  Head: Normocephalic and atraumatic.  Eyes: Conjunctivae are normal.  Neck: Neck supple.  Cardiovascular: Normal rate and regular rhythm.  No murmur heard. Pulmonary/Chest: Effort normal and breath sounds normal. No respiratory distress.  Abdominal: Soft. There is no tenderness.  Musculoskeletal: He exhibits tenderness.  Tender bilat scaitic notch,  nontender ls spine,   Neurological: He is alert.  Skin: Skin is warm and dry.  Psychiatric: He has a normal mood and affect.  Nursing note and vitals reviewed.  ED Treatments / Results  Labs (all labs ordered are listed, but only abnormal results are displayed) Labs Reviewed - No data to display  EKG  EKG Interpretation None       Radiology Dg Lumbar Spine Complete  Result Date: 04/10/2017 CLINICAL DATA:  Chronic low back and bilateral leg pain. No known injury. EXAM: LUMBAR SPINE - COMPLETE 4+ VIEW COMPARISON:  None. FINDINGS: There straightening of the normal lumbar lordosis. Intervertebral disc space height is maintained. Mild endplate spurring is most notable anteriorly at L4-5 and L5-S1. No pars interarticularis defect. Paraspinous structures are unremarkable. IMPRESSION: Mild anterior endplate spurring P9-5 and L5-S1. The exam is otherwise negative. Electronically Signed   By: Inge Rise M.D.   On: 04/10/2017 12:17   Dg Pelvis 1-2 Views  Result Date: 04/10/2017 CLINICAL DATA:  Chronic progressive bilateral hip and leg pain. EXAM: PELVIS - 1-2 VIEW COMPARISON:  CT scan of the pelvis dated 06/09/2016 FINDINGS: Pelvic bones appear normal. Benign synovial herniation pits in both femoral necks, unchanged. The bones are otherwise normal. IMPRESSION: No significant abnormality. Electronically Signed   By: Lorriane Shire M.D.   On: 04/10/2017 12:15    Procedures Procedures (including critical care time)  Medications Ordered in ED Medications - No data to display   Initial Impression / Assessment and Plan / ED Course  I have reviewed the triage vital signs and the nursing notes.  Pertinent labs & imaging results that were available during my care of the patient were reviewed by me and considered in my medical decision making (see chart for details).    MDM: Pt counseled on sciatic nerve irritation.  Pt given rx for prednisone and robaxin I advised him to schedule follow up with Orthopaedist   Final Clinical Impressions(s) / ED Diagnoses   Final diagnoses:  Bilateral sciatica    ED Discharge Orders        Ordered    methocarbamol (ROBAXIN) 500 MG tablet  2 times daily     04/10/17 1243    predniSONE (DELTASONE) 10 MG tablet     04/10/17 1243    An After Visit Summary was printed and given to the patient.    Fransico Meadow, PA-C 04/10/17 1710    Duffy Bruce, MD 04/11/17 [phone]

## 2017-08-10 ENCOUNTER — Ambulatory Visit: Payer: Self-pay | Admitting: Family Medicine

## 2017-08-14 ENCOUNTER — Ambulatory Visit (INDEPENDENT_AMBULATORY_CARE_PROVIDER_SITE_OTHER): Payer: Self-pay | Admitting: Family Medicine

## 2017-08-14 ENCOUNTER — Encounter: Payer: Self-pay | Admitting: Family Medicine

## 2017-08-14 VITALS — BP 106/68 | HR 74 | Temp 98.0°F | Ht 72.0 in | Wt 177.2 lb

## 2017-08-14 DIAGNOSIS — F17200 Nicotine dependence, unspecified, uncomplicated: Secondary | ICD-10-CM

## 2017-08-14 DIAGNOSIS — G8929 Other chronic pain: Secondary | ICD-10-CM

## 2017-08-14 DIAGNOSIS — R200 Anesthesia of skin: Secondary | ICD-10-CM

## 2017-08-14 DIAGNOSIS — M5441 Lumbago with sciatica, right side: Secondary | ICD-10-CM

## 2017-08-14 DIAGNOSIS — Z09 Encounter for follow-up examination after completed treatment for conditions other than malignant neoplasm: Secondary | ICD-10-CM

## 2017-08-14 DIAGNOSIS — Z131 Encounter for screening for diabetes mellitus: Secondary | ICD-10-CM

## 2017-08-14 DIAGNOSIS — K219 Gastro-esophageal reflux disease without esophagitis: Secondary | ICD-10-CM

## 2017-08-14 DIAGNOSIS — M5442 Lumbago with sciatica, left side: Secondary | ICD-10-CM

## 2017-08-14 DIAGNOSIS — R202 Paresthesia of skin: Secondary | ICD-10-CM

## 2017-08-14 LAB — POCT URINALYSIS DIP (MANUAL ENTRY)
Bilirubin, UA: NEGATIVE
Blood, UA: NEGATIVE
Glucose, UA: NEGATIVE mg/dL
Leukocytes, UA: NEGATIVE
Nitrite, UA: NEGATIVE
Protein Ur, POC: 30 mg/dL — AB
Spec Grav, UA: >=1.03 — AB (ref 1.010–1.025)
Urobilinogen, UA: 1 E.U./dL
pH, UA: 6 (ref 5.0–8.0)

## 2017-08-14 LAB — POCT GLYCOSYLATED HEMOGLOBIN (HGB A1C): Hemoglobin A1C: 5.6 % (ref 4.0–5.6)

## 2017-08-14 MED ORDER — OMEPRAZOLE 20 MG PO CPDR: 20.0000 mg | DELAYED_RELEASE_CAPSULE | Freq: Every day | ORAL | 1 refills | Status: DC

## 2017-08-14 MED ORDER — GABAPENTIN 300 MG PO CAPS: 300.0000 mg | ORAL_CAPSULE | Freq: Three times a day (TID) | ORAL | 3 refills | Status: DC

## 2017-08-14 MED ORDER — NAPROXEN 500 MG PO TABS: 500.0000 mg | ORAL_TABLET | Freq: Two times a day (BID) | ORAL | 1 refills | Status: DC

## 2017-08-14 NOTE — Progress Notes (Signed)
New Patient Consultation   Subjective:    Patient ID: Donald York, male    DOB: 09-19-1960, 57 y.o.   MRN: [id number]   PCP: Donald Becton, NP  Chief Complaint  Patient presents with  . Establish Care  . Hospitalization Follow-up    Leg and hip pain     HPI  Donald York has a history of Peri-Rectal Abscess, Chronic Back Pain. He is here today to establish care.   Current Status: He denies fevers, chills, fatigue, recent infections, weight loss, and night sweats. He has occasional dizziness. He has not had any headaches, visual changes, and falls.   She has mild chest pain, r/t acid indigestion. He is using OTC meds for this problem, but it has been unsuccessful. No heart palpitations, cough and shortness of breath reported.   No reports of GI problems.   He has no reports of blood in stools, dysuria and hematuria. No depression or anxiety.   He has numbness and tingling in his legs and feet, in which he is unable to walk at times. He has chronic back pain.   He continues to smoke 1 pack of cigarettes daily.    Past Medical History:  Diagnosis Date  . Family history of adverse reaction to anesthesia    " [name] "  . Peri-rectal abscess 06/09/2016    Family History  Problem Relation Age of Onset  . Cancer Maternal Uncle    Social History   Socioeconomic History  . Marital status: Single    Spouse name: Not on file  . Number of children: Not on file  . Years of education: Not on file  . Highest education level: Not on file  Occupational History  . Not on file  Social Needs  . Financial resource strain: Not on file  . Food insecurity:    Worry: Not on file    Inability: Not on file  . Transportation needs:    Medical: Not on file    Non-medical: Not on file  Tobacco Use  . Smoking status: Current Some Day Smoker    Packs/day: 0.50    Types: Cigarettes  . Smokeless tobacco: Never Used  Substance and Sexual Activity  . Alcohol use:  Yes    Comment: 40 oz beer, every now and then  . Drug use: Yes    Types: Cocaine, Marijuana  . Sexual activity: Not on file  Lifestyle  . Physical activity:    Days per week: Not on file    Minutes per session: Not on file  . Stress: Not on file  Relationships  . Social connections:    Talks on phone: Not on file    Gets together: Not on file    Attends religious service: Not on file    Active member of club or organization: Not on file    Attends meetings of clubs or organizations: Not on file    Relationship status: Not on file  . Intimate partner violence:    Fear of current or ex partner: Not on file    Emotionally abused: Not on file    Physically abused: Not on file    Forced sexual activity: Not on file  Other Topics Concern  . Not on file  Social History Narrative  . Not on file    Past Surgical History:  Procedure Laterality Date  . NO PAST SURGERIES       There is no immunization history on file for this patient.  No outpatient medications have been marked as taking for the 08/14/17 encounter (Office Visit) with Azzie Glatter, FNP.   No Known Allergies  BP 106/68 (BP Location: Left Arm, Patient Position: Sitting, Cuff Size: Small)   Pulse 74   Temp 98 F (36.7 C) (Oral)   Ht 6' (1.829 m)   Wt 177 lb 3.2 oz (80.4 kg)   SpO2 100%   BMI 24.03 kg/m   Review of Systems  Constitutional: Negative.   HENT: Negative.   Eyes: Negative.   Respiratory: Negative.   Cardiovascular: Negative.   Gastrointestinal: Negative.   Endocrine: Negative.   Genitourinary: Negative.   Musculoskeletal: Positive for arthralgias (back pain, leg pain. ).  Skin: Negative.   Allergic/Immunologic: Negative.   Neurological: Positive for dizziness and numbness.  Hematological: Negative.   Psychiatric/Behavioral: Negative.        Objective:   Physical Exam  Constitutional: He is oriented to person, place, and time. He appears well-developed and well-nourished.  HENT:   Head: Normocephalic and atraumatic.  Right Ear: External ear normal.  Left Ear: External ear normal.  Nose: Nose normal.  Mouth/Throat: Oropharynx is clear and moist.  Eyes: Pupils are equal, round, and reactive to light. Conjunctivae and EOM are normal.  Neck: Normal range of motion. Neck supple.  Cardiovascular: Normal rate, regular rhythm, normal heart sounds and intact distal pulses.  Pulmonary/Chest: Effort normal and breath sounds normal.  Abdominal: Soft. Bowel sounds are normal.  Musculoskeletal: Normal range of motion.  Neurological: He is alert and oriented to person, place, and time.  Skin: Skin is warm and dry. Capillary refill takes less than 2 seconds.  Psychiatric: He has a normal mood and affect. His behavior is normal. Judgment and thought content normal.  Nursing note and vitals reviewed.  Assessment & Plan:   1. Screening for diabetes mellitus Hgb A1c is stable at 5.6 today. Urinalysis revealed Ketones, and Protein. He is mildly dehydrated.  - POCT glycosylated hemoglobin (Hb A1C) - POCT urinalysis dipstick  2. Chronic bilateral low back pain with bilateral sciatica - gabapentin (NEURONTIN) 300 MG capsule; Take 1 capsule (300 mg total) by mouth 3 (three) times daily.  Dispense: 90 capsule; Refill: 3 - naproxen (NAPROSYN) 500 MG tablet; Take 1 tablet (500 mg total) by mouth 2 (two) times daily with a meal.  Dispense: 30 tablet; Refill: 1  3. Numbness and tingling of both legs Rx for Neurontin given today, for numbness and tingling in legs and feet Monitor.   4. Gastroesophageal reflux disease without esophagitis - omeprazole (PRILOSEC) 20 MG capsule; Take 1 capsule (20 mg total) by mouth daily.  Dispense: 30 capsule; Refill: 1  5. Smoker He is not ready for smoking cessation today.   6. Follow up He will follow up in 1 month.   Meds ordered this encounter  Medications  . gabapentin (NEURONTIN) 300 MG capsule    Sig: Take 1 capsule (300 mg total) by mouth  3 (three) times daily.    Dispense:  90 capsule    Refill:  3  . omeprazole (PRILOSEC) 20 MG capsule    Sig: Take 1 capsule (20 mg total) by mouth daily.    Dispense:  30 capsule    Refill:  1  . naproxen (NAPROSYN) 500 MG tablet    Sig: Take 1 tablet (500 mg total) by mouth 2 (two) times daily with a meal.    Dispense:  30 tablet    Refill:  1  Donald Becton,  MSN, FNP-BC Patient [facility] [address] [place], [place] [address] [phone]

## 2017-09-04 ENCOUNTER — Encounter: Payer: Self-pay | Admitting: Family Medicine

## 2017-09-04 ENCOUNTER — Ambulatory Visit (INDEPENDENT_AMBULATORY_CARE_PROVIDER_SITE_OTHER): Payer: Self-pay | Admitting: Family Medicine

## 2017-09-04 VITALS — BP 100/68 | HR 82 | Temp 98.1°F | Ht 72.0 in | Wt 179.0 lb

## 2017-09-04 DIAGNOSIS — R202 Paresthesia of skin: Secondary | ICD-10-CM

## 2017-09-04 DIAGNOSIS — M5441 Lumbago with sciatica, right side: Secondary | ICD-10-CM

## 2017-09-04 DIAGNOSIS — Z09 Encounter for follow-up examination after completed treatment for conditions other than malignant neoplasm: Secondary | ICD-10-CM

## 2017-09-04 DIAGNOSIS — Z23 Encounter for immunization: Secondary | ICD-10-CM

## 2017-09-04 DIAGNOSIS — M5442 Lumbago with sciatica, left side: Secondary | ICD-10-CM

## 2017-09-04 DIAGNOSIS — R200 Anesthesia of skin: Secondary | ICD-10-CM

## 2017-09-04 DIAGNOSIS — G8929 Other chronic pain: Secondary | ICD-10-CM

## 2017-09-04 MED ORDER — MELOXICAM 15 MG PO TABS: 15.0000 mg | ORAL_TABLET | Freq: Every day | ORAL | 1 refills | Status: DC

## 2017-09-04 MED ORDER — NAPROXEN 500 MG PO TABS: 500.0000 mg | ORAL_TABLET | Freq: Two times a day (BID) | ORAL | 1 refills | Status: DC

## 2017-09-04 MED ORDER — KETOROLAC TROMETHAMINE 60 MG/2ML IM SOLN
60.0000 mg | Freq: Once | INTRAMUSCULAR | Status: AC
  Administered 2017-09-04: 60 mg via INTRAMUSCULAR

## 2017-09-04 NOTE — Progress Notes (Signed)
Sick Visit   Subjective:    Patient ID: Donald York, male    DOB: 1960-04-16, 57 y.o.   MRN: [id number]   PCP: Kathe Becton, NP  Chief Complaint  Patient presents with  . Leg Pain    right    HPI Mr. Wence has a history of Peri-Rectal Abscess, Chronic Back Pain. He is here today to establish care.   Current Status: He denies fevers, chills, fatigue, recent infections, weight loss, and night sweats. He has occasional dizziness. He has not had any headaches, visual changes, and falls.   She has mild chest pain, r/t acid indigestion. He is using OTC meds for this problem, but it has been unsuccessful. No heart palpitations, cough and shortness of breath reported.   No reports of GI problems.   He has no reports of blood in stools, dysuria and hematuria. No depression or anxiety.   He has numbness and tingling in his legs and feet, in which he is unable to walk at times. He has chronic back pain.   He continues to smoke 1 pack of cigarettes daily.   Past Medical History:  Diagnosis Date  . Family history of adverse reaction to anesthesia    " [name] "  . Peri-rectal abscess 06/09/2016    Family History  Problem Relation Age of Onset  . Cancer Maternal Uncle    Social History   Socioeconomic History  . Marital status: Single    Spouse name: Not on file  . Number of children: Not on file  . Years of education: Not on file  . Highest education level: Not on file  Occupational History  . Not on file  Social Needs  . Financial resource strain: Not on file  . Food insecurity:    Worry: Not on file    Inability: Not on file  . Transportation needs:    Medical: Not on file    Non-medical: Not on file  Tobacco Use  . Smoking status: Current Some Day Smoker    Packs/day: 0.50    Types: Cigarettes  . Smokeless tobacco: Never Used  Substance and Sexual Activity  . Alcohol use: Yes    Comment: 40 oz beer, every now and then  . Drug use: Yes    Types:  Cocaine, Marijuana  . Sexual activity: Not on file  Lifestyle  . Physical activity:    Days per week: Not on file    Minutes per session: Not on file  . Stress: Not on file  Relationships  . Social connections:    Talks on phone: Not on file    Gets together: Not on file    Attends religious service: Not on file    Active member of club or organization: Not on file    Attends meetings of clubs or organizations: Not on file    Relationship status: Not on file  . Intimate partner violence:    Fear of current or ex partner: Not on file    Emotionally abused: Not on file    Physically abused: Not on file    Forced sexual activity: Not on file  Other Topics Concern  . Not on file  Social History Narrative  . Not on file    Past Surgical History:  Procedure Laterality Date  . NO PAST SURGERIES      Immunization History  Administered Date(s) Administered  . Tdap 09/04/2017    Current Meds  Medication Sig  . gabapentin (NEURONTIN)  300 MG capsule Take 1 capsule (300 mg total) by mouth 3 (three) times daily.   No Known Allergies  BP 100/68 (BP Location: Left Arm, Patient Position: Sitting, Cuff Size: Small)   Pulse 82   Temp 98.1 F (36.7 C) (Oral)   Ht 6' (1.829 m)   Wt 179 lb (81.2 kg)   SpO2 98%   BMI 24.28 kg/m   Review of Systems  Constitutional: Negative.   HENT: Negative.   Eyes: Negative.   Respiratory: Negative.   Cardiovascular: Negative.   Gastrointestinal: Negative.   Endocrine: Negative.   Genitourinary: Negative.   Musculoskeletal: Positive for arthralgias (back pain, leg pain. ).  Skin: Negative.   Allergic/Immunologic: Negative.   Neurological: Positive for dizziness and numbness.  Hematological: Negative.   Psychiatric/Behavioral: Negative.    Objective:   Physical Exam  Constitutional: He is oriented to person, place, and time. He appears well-developed and well-nourished.  HENT:  Head: Normocephalic and atraumatic.  Right Ear: External  ear normal.  Left Ear: External ear normal.  Nose: Nose normal.  Mouth/Throat: Oropharynx is clear and moist.  Eyes: Pupils are equal, round, and reactive to light. Conjunctivae and EOM are normal.  Neck: Normal range of motion. Neck supple.  Cardiovascular: Normal rate, regular rhythm, normal heart sounds and intact distal pulses.  Pulmonary/Chest: Effort normal and breath sounds normal.  Abdominal: Soft. Bowel sounds are normal.  Musculoskeletal: Normal range of motion.  Neurological: He is alert and oriented to person, place, and time.  Skin: Skin is warm and dry. Capillary refill takes less than 2 seconds.  Psychiatric: He has a normal mood and affect. His behavior is normal. Judgment and thought content normal.  Nursing note and vitals reviewed.  Assessment & Plan:   1. Chronic bilateral low back pain with bilateral sciatica Overall pain medications effective. He states that Robaxin does not work. We will discontinue Robaxin and initiate Mobic today. He has increased back pain today. We will give Toradol 60 mg Injection today.   - meloxicam (MOBIC) 15 MG tablet; Take 1 tablet (15 mg total) by mouth daily.  Dispense: 30 tablet; Refill: 1 - ketorolac (TORADOL) injection 60 mg - naproxen (NAPROSYN) 500 MG tablet; Take 1 tablet (500 mg total) by mouth 2 (two) times daily with a meal.  Dispense: 30 tablet; Refill: 1  2. Need for Tdap vaccination - Tdap vaccine greater than or equal to 7yo IM  3. Numbness and tingling of both legs Stable. He will continue Neurontin as prescribed.   4. Follow up He will keep follow up appointment on 09/22/2017.  Meds ordered this encounter  Medications  . meloxicam (MOBIC) 15 MG tablet    Sig: Take 1 tablet (15 mg total) by mouth daily.    Dispense:  30 tablet    Refill:  1  . ketorolac (TORADOL) injection 60 mg  . naproxen (NAPROSYN) 500 MG tablet    Sig: Take 1 tablet (500 mg total) by mouth 2 (two) times daily with a meal.    Dispense:  30  tablet    Refill:  [place],  MSN, FNP-BC Patient [facility] [address] [place], [place] [address] [phone]

## 2017-09-04 NOTE — Patient Instructions (Signed)
Meloxicam tablets What is this medicine? MELOXICAM (mel OX i cam) is a non-steroidal anti-inflammatory drug (NSAID). It is used to reduce swelling and to treat pain. It may be used for osteoarthritis, rheumatoid arthritis, or juvenile rheumatoid arthritis. This medicine may be used for other purposes; ask your health care provider or pharmacist if you have questions. COMMON BRAND NAME(S): Mobic What should I tell my health care provider before I take this medicine? They need to know if you have any of these conditions: -bleeding disorders -cigarette smoker -coronary artery bypass graft (CABG) surgery within the past 2 weeks -drink more than 3 alcohol-containing drinks per day -heart disease -high blood pressure -history of stomach bleeding -kidney disease -liver disease -lung or breathing disease, like asthma -stomach or intestine problems -an unusual or allergic reaction to meloxicam, aspirin, other NSAIDs, other medicines, foods, dyes, or preservatives -pregnant or trying to get pregnant -breast-feeding How should I use this medicine? Take this medicine by mouth with a full glass of water. Follow the directions on the prescription label. You can take it with or without food. If it upsets your stomach, take it with food. Take your medicine at regular intervals. Do not take it more often than directed. Do not stop taking except on your doctor's advice. A special MedGuide will be given to you by the pharmacist with each prescription and refill. Be sure to read this information carefully each time. Talk to your pediatrician regarding the use of this medicine in children. While this drug may be prescribed for selected conditions, precautions do apply. Patients over 65 years old may have a stronger reaction and need a smaller dose. Overdosage: If you think you have taken too much of this medicine contact a poison control center or emergency room at once. NOTE: This medicine is only for you. Do  not share this medicine with others. What if I miss a dose? If you miss a dose, take it as soon as you can. If it is almost time for your next dose, take only that dose. Do not take double or extra doses. What may interact with this medicine? Do not take this medicine with any of the following medications: -cidofovir -ketorolac This medicine may also interact with the following medications: -aspirin and aspirin-like medicines -certain medicines for blood pressure, heart disease, irregular heart beat -certain medicines for depression, anxiety, or psychotic disturbances -certain medicines that treat or prevent blood clots like warfarin, enoxaparin, dalteparin, apixaban, dabigatran, rivaroxaban -cyclosporine -digoxin -diuretics -methotrexate -other NSAIDs, medicines for pain and inflammation, like ibuprofen and naproxen -pemetrexed This list may not describe all possible interactions. Give your health care provider a list of all the medicines, herbs, non-prescription drugs, or dietary supplements you use. Also tell them if you smoke, drink alcohol, or use illegal drugs. Some items may interact with your medicine. What should I watch for while using this medicine? Tell your doctor or healthcare professional if your symptoms do not start to get better or if they get worse. Do not take other medicines that contain aspirin, ibuprofen, or naproxen with this medicine. Side effects such as stomach upset, nausea, or ulcers may be more likely to occur. Many medicines available without a prescription should not be taken with this medicine. This medicine can cause ulcers and bleeding in the stomach and intestines at any time during treatment. This can happen with no warning and may cause death. There is increased risk with taking this medicine for a long time. Smoking, drinking alcohol, older age,   and poor health can also increase risks. Call your doctor right away if you have stomach pain or blood in your  vomit or stool. This medicine does not prevent heart attack or stroke. In fact, this medicine may increase the chance of a heart attack or stroke. The chance may increase with longer use of this medicine and in people who have heart disease. If you take aspirin to prevent heart attack or stroke, talk with your doctor or health care professional. What side effects may I notice from receiving this medicine? Side effects that you should report to your doctor or health care professional as soon as possible: -allergic reactions like skin rash, itching or hives, swelling of the face, lips, or tongue -nausea, vomiting -signs and symptoms of a blood clot such as breathing problems; changes in vision; chest pain; severe, sudden headache; pain, swelling, warmth in the leg; trouble speaking; sudden numbness or weakness of the face, arm, or leg -signs and symptoms of bleeding such as bloody or black, tarry stools; red or dark-brown urine; spitting up blood or brown material that looks like coffee grounds; red spots on the skin; unusual bruising or bleeding from the eye, gums, or nose -signs and symptoms of liver injury like dark yellow or brown urine; general ill feeling or flu-like symptoms; light-colored stools; loss of appetite; nausea; right upper belly pain; unusually weak or tired; yellowing of the eyes or skin -signs and symptoms of stroke like changes in vision; confusion; trouble speaking or understanding; severe headaches; sudden numbness or weakness of the face, arm, or leg; trouble walking; dizziness; loss of balance or coordination Side effects that usually do not require medical attention (report to your doctor or health care professional if they continue or are bothersome): -constipation -diarrhea -gas This list may not describe all possible side effects. Call your doctor for medical advice about side effects. You may report side effects to FDA at 1-800-FDA-1088. Where should I keep my  medicine? Keep out of the reach of children. Store at room temperature between 15 and 30 degrees C (59 and 86 degrees F). Throw away any unused medicine after the expiration date. NOTE: This sheet is a summary. It may not cover all possible information. If you have questions about this medicine, talk to your doctor, pharmacist, or health care provider.  2018 Elsevier/Gold Standard (2015-03-14 19:28:16)  

## 2017-09-08 ENCOUNTER — Ambulatory Visit: Payer: Self-pay | Admitting: Family Medicine

## 2017-09-22 ENCOUNTER — Ambulatory Visit: Payer: Self-pay | Admitting: Family Medicine

## 2018-02-24 DIAGNOSIS — I639 Cerebral infarction, unspecified: Secondary | ICD-10-CM

## 2018-02-24 HISTORY — DX: Cerebral infarction, unspecified: I63.9

## 2018-02-26 ENCOUNTER — Emergency Department (HOSPITAL_COMMUNITY)
Admission: EM | Admit: 2018-02-26 | Discharge: 2018-02-26 | Disposition: A | Discharge status: Home / Self Care | Payer: Self-pay | Attending: Emergency Medicine | Admitting: Emergency Medicine

## 2018-02-26 ENCOUNTER — Emergency Department (HOSPITAL_COMMUNITY): Payer: Self-pay

## 2018-02-26 DIAGNOSIS — F17210 Nicotine dependence, cigarettes, uncomplicated: Secondary | ICD-10-CM | POA: Insufficient documentation

## 2018-02-26 DIAGNOSIS — R202 Paresthesia of skin: Secondary | ICD-10-CM

## 2018-02-26 DIAGNOSIS — R112 Nausea with vomiting, unspecified: Secondary | ICD-10-CM | POA: Insufficient documentation

## 2018-02-26 DIAGNOSIS — R1013 Epigastric pain: Secondary | ICD-10-CM | POA: Insufficient documentation

## 2018-02-26 DIAGNOSIS — R200 Anesthesia of skin: Secondary | ICD-10-CM

## 2018-02-26 DIAGNOSIS — R197 Diarrhea, unspecified: Secondary | ICD-10-CM | POA: Insufficient documentation

## 2018-02-26 LAB — COMPREHENSIVE METABOLIC PANEL
ALBUMIN: 3.2 g/dL — AB (ref 3.5–5.0)
ALK PHOS: 53 U/L (ref 38–126)
ALT: 14 U/L (ref 0–44)
AST: 16 U/L (ref 15–41)
Anion gap: 7 (ref 5–15)
BILIRUBIN TOTAL: 0.6 mg/dL (ref 0.3–1.2)
BUN: 8 mg/dL (ref 6–20)
CALCIUM: 8.2 mg/dL — AB (ref 8.9–10.3)
CO2: 27 mmol/L (ref 22–32)
CREATININE: 1.46 mg/dL — AB (ref 0.61–1.24)
Chloride: 105 mmol/L (ref 98–111)
GFR calc non Af Amer: 53 mL/min — ABNORMAL LOW (ref >60)
Glucose, Bld: 97 mg/dL (ref 70–99)
Potassium: 3.7 mmol/L (ref 3.5–5.1)
SODIUM: 139 mmol/L (ref 135–145)
TOTAL PROTEIN: 5.6 g/dL — AB (ref 6.5–8.1)

## 2018-02-26 LAB — CBC WITH DIFFERENTIAL/PLATELET
ABS IMMATURE GRANULOCYTES: 0.03 10*3/uL (ref 0.00–0.07)
Basophils Absolute: 0 10*3/uL (ref 0.0–0.1)
Basophils Relative: 0 %
EOS PCT: 2 %
Eosinophils Absolute: 0.2 10*3/uL (ref 0.0–0.5)
HEMATOCRIT: 44.5 % (ref 39.0–52.0)
HEMOGLOBIN: 14.3 g/dL (ref 13.0–17.0)
Immature Granulocytes: 0 %
LYMPHS ABS: 3.2 10*3/uL (ref 0.7–4.0)
LYMPHS PCT: 35 %
MCH: 29.1 pg (ref 26.0–34.0)
MCHC: 32.1 g/dL (ref 30.0–36.0)
MCV: 90.4 fL (ref 80.0–100.0)
MONO ABS: 0.9 10*3/uL (ref 0.1–1.0)
MONOS PCT: 10 %
NEUTROS ABS: 4.7 10*3/uL (ref 1.7–7.7)
Neutrophils Relative %: 53 %
Platelets: 248 10*3/uL (ref 150–400)
RBC: 4.92 MIL/uL (ref 4.22–5.81)
RDW: 13.1 % (ref 11.5–15.5)
WBC: 9 10*3/uL (ref 4.0–10.5)
nRBC: 0 % (ref 0.0–0.2)

## 2018-02-26 LAB — URINALYSIS, ROUTINE W REFLEX MICROSCOPIC
Bilirubin Urine: NEGATIVE
Glucose, UA: NEGATIVE mg/dL
HGB URINE DIPSTICK: NEGATIVE
Ketones, ur: NEGATIVE mg/dL
Leukocytes, UA: NEGATIVE
Nitrite: NEGATIVE
PH: 7 (ref 5.0–8.0)
Protein, ur: NEGATIVE mg/dL
SPECIFIC GRAVITY, URINE: 1.011 (ref 1.005–1.030)

## 2018-02-26 LAB — LIPASE, BLOOD: Lipase: 26 U/L (ref 11–51)

## 2018-02-26 IMAGING — CT CT ABD-PELV W/O CM
2 of 4 series · 16 of 46 positions shown, 18 images · non-contrast
Comparison: [DATE]

CLINICAL DATA: Abdominal pain, nausea, vomiting and diarrhea.

EXAM:
CT ABDOMEN AND PELVIS WITHOUT CONTRAST
TECHNIQUE: Multidetector CT imaging of the abdomen and pelvis was performed
following the standard protocol without IV contrast.

[Series 3: abd/ pelvis 5.0 i30f 2 · axial · 0.75mm/px · z∈[+680,+1110]mm · 13 of 94 slices shown, 15 images]
[im 4/94  soft-tissue]
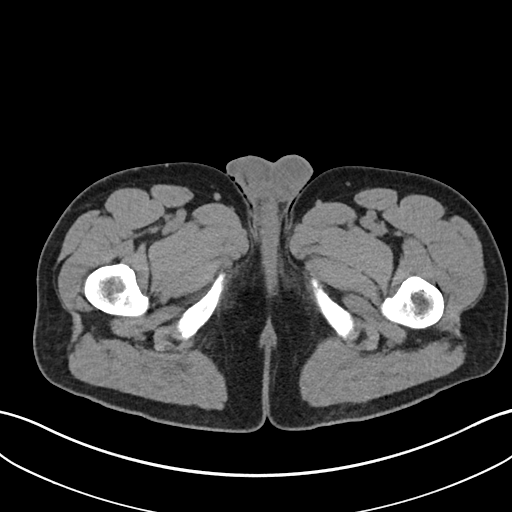
[im 4/94  bone]
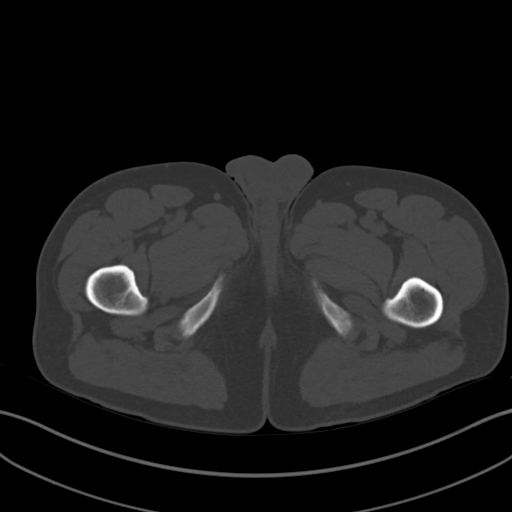
[im 12/94  soft-tissue]
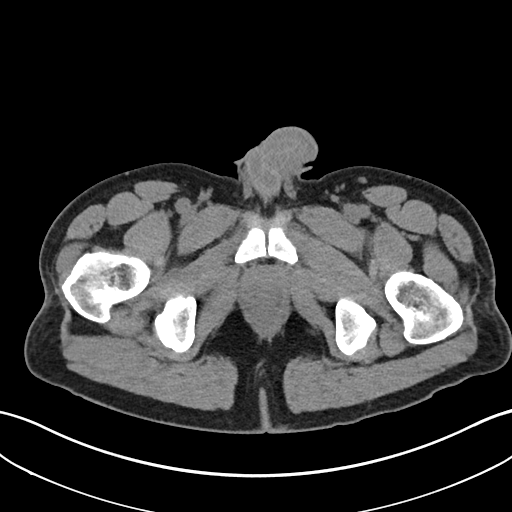
[im 19/94  soft-tissue]
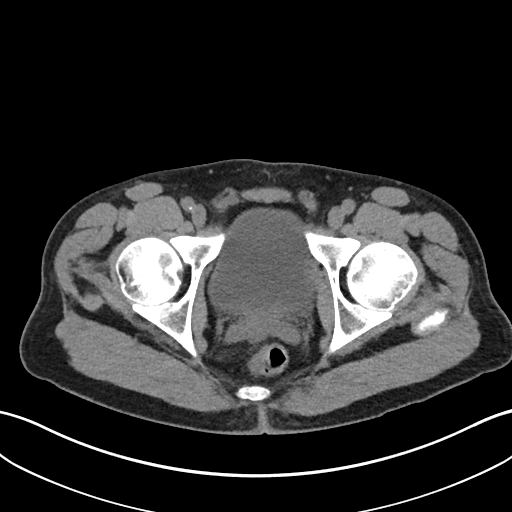
[im 27/94  soft-tissue]
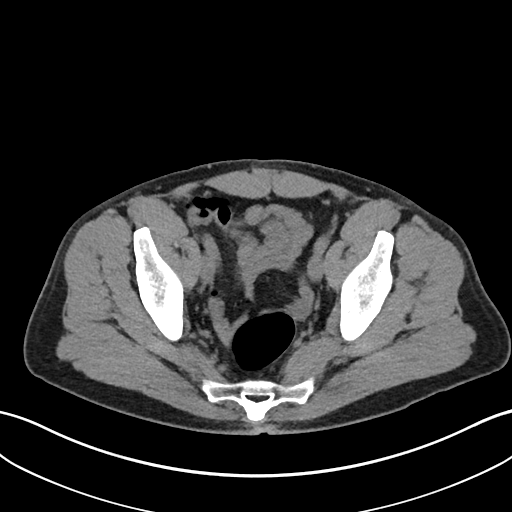
[im 34/94  soft-tissue]
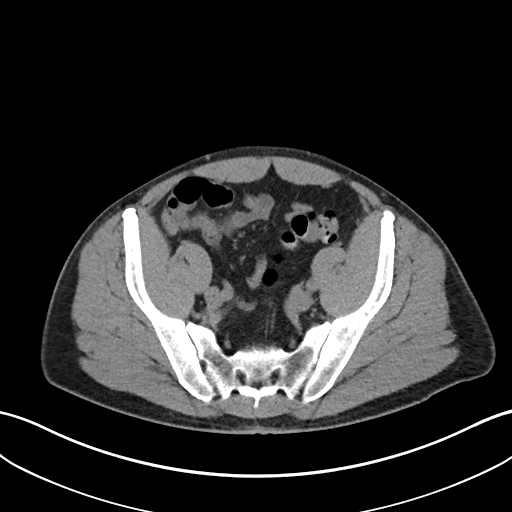
[im 41/94  soft-tissue]
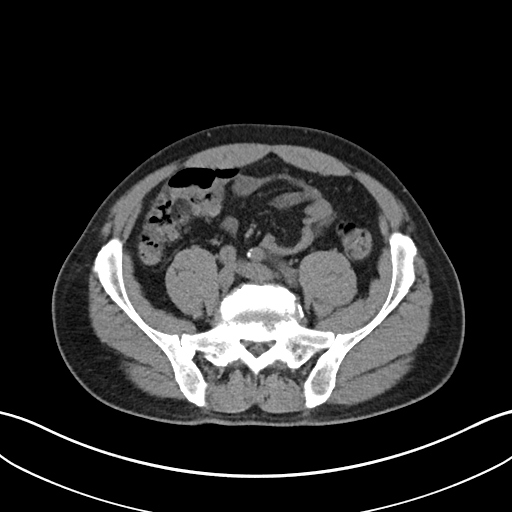
[im 49/94  soft-tissue]
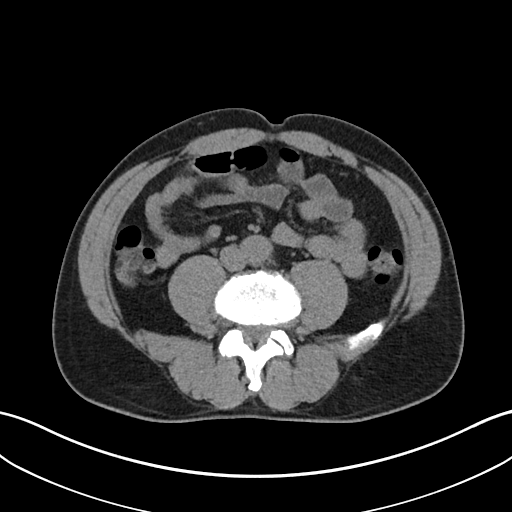
[im 53/94  soft-tissue]
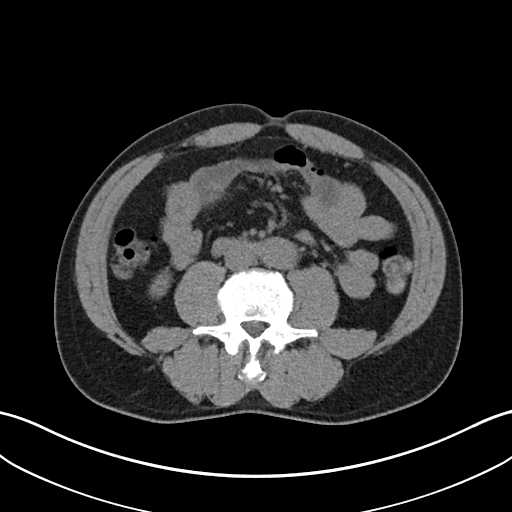
[im 60/94  soft-tissue]
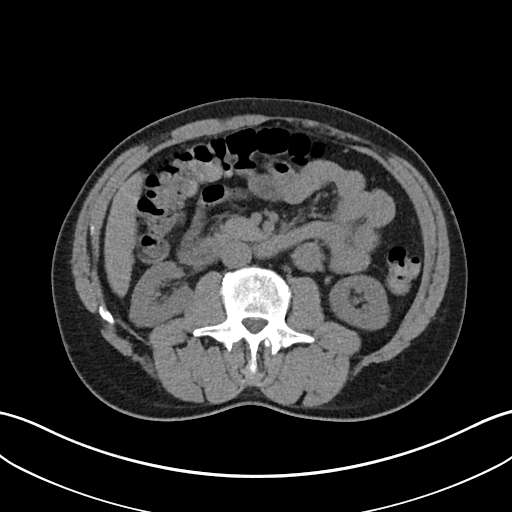
[im 60/94  bone]
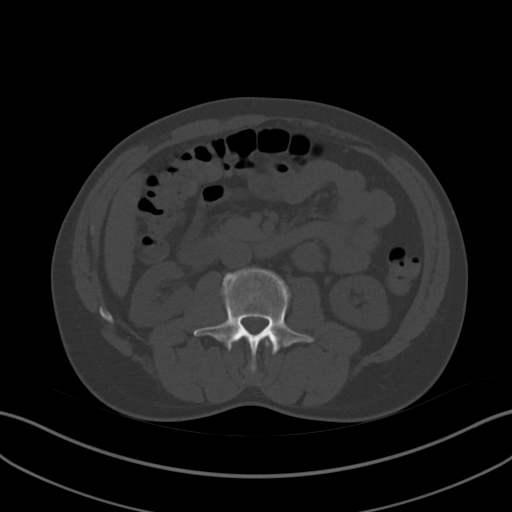
[im 67/94  soft-tissue]
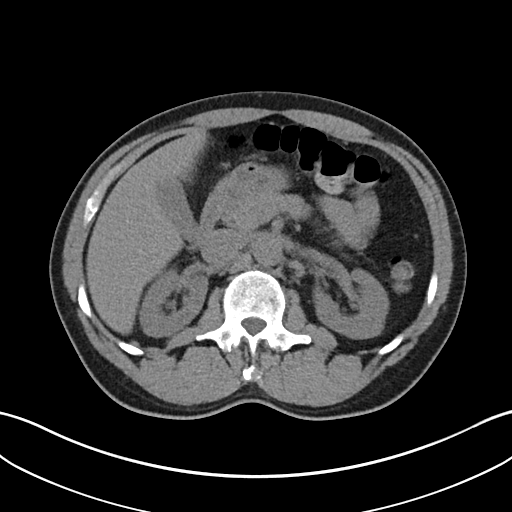
[im 75/94  soft-tissue]
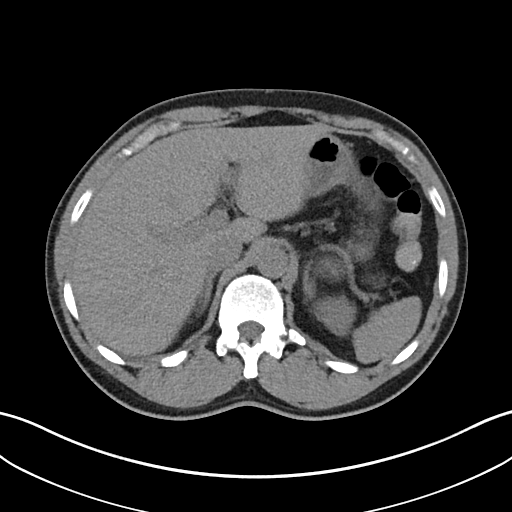
[im 82/94  soft-tissue]
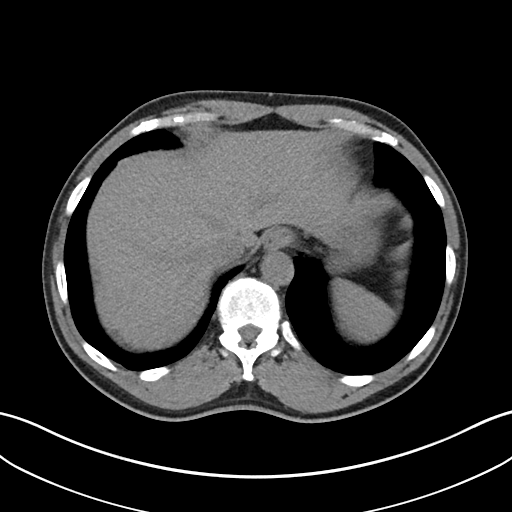
[im 90/94  soft-tissue]
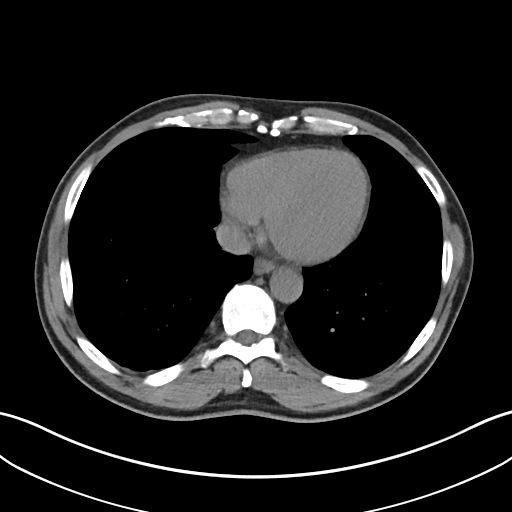

[Series 6: cor st · coronal · 0.81mm/px · 3 of 97 slices shown]
[im 33/97  soft-tissue]
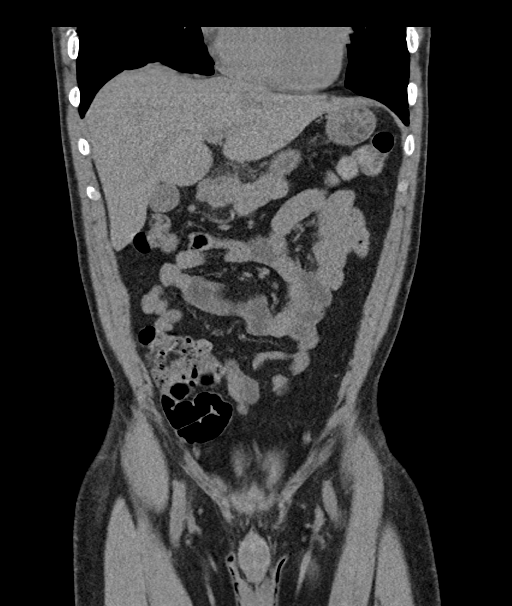
[im 43/97  soft-tissue]
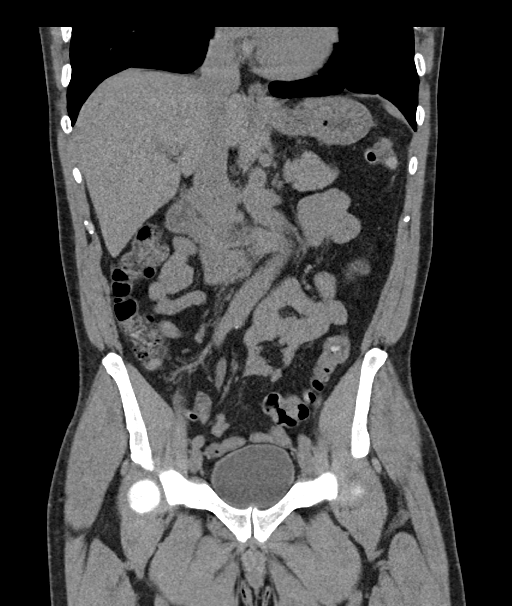
[im 54/97  soft-tissue]
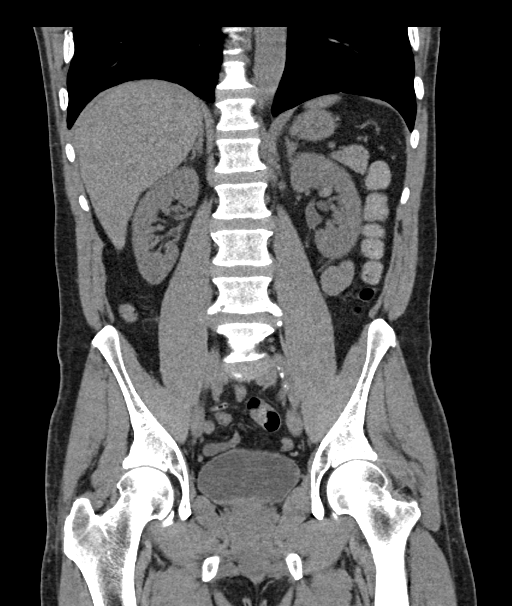

[16 of 46 positions shown; findings below may reference images not displayed]

FINDINGS: Lower chest: Mild hyperinflation. Minor atelectasis versus scarring
dependently. Normal heart size. No pericardial pleural effusion.

Hepatobiliary: No focal liver abnormality is seen. No gallstones,
gallbladder wall thickening, or biliary dilatation.

Pancreas: Unremarkable. No pancreatic ductal dilatation or
surrounding inflammatory changes.

Spleen: Normal in size without focal abnormality.

Adrenals/Urinary Tract: Adrenal glands are unremarkable. Kidneys are
normal, without renal calculi, focal lesion, or hydronephrosis.
Bladder is unremarkable.

Stomach/Bowel: Negative for bowel obstruction, significant
dilatation, ileus, or free air. Normal appearing appendix containing
hyperdense material and air extending into the right hemipelvis. No
signs of appendicitis.

Vascular/Lymphatic: Aortoiliac tortuosity and atherosclerosis. No
retroperitoneal hemorrhage or hematoma. No large aneurysm.

No adenopathy.

Reproductive: Unremarkable by CT.

Other: No abdominal wall hernia or abnormality. No abdominopelvic
ascites.

Musculoskeletal: Degenerative changes of the spine. These changes
are most pronounced at L4-5 and L5-S1 with associated facet
arthropathy as well. No acute osseous finding.
IMPRESSION: No acute abdominal or pelvic finding by noncontrast CT.

Abdominal aortic atherosclerosis and tortuosity without aneurysm or
acute process

Negative for appendicitis

## 2018-02-26 MED ORDER — RANITIDINE HCL 150 MG PO CAPS: 150.0000 mg | ORAL_CAPSULE | Freq: Two times a day (BID) | ORAL | 0 refills | Status: DC

## 2018-02-26 MED ORDER — MORPHINE SULFATE (PF) 4 MG/ML IV SOLN
6.0000 mg | Freq: Once | INTRAVENOUS | Status: AC
  Administered 2018-02-26: 6 mg via INTRAVENOUS
  Filled 2018-02-26: qty 2

## 2018-02-26 MED ORDER — SODIUM CHLORIDE 0.9 % IV BOLUS
1000.0000 mL | Freq: Once | INTRAVENOUS | Status: AC
  Administered 2018-02-26: 1000 mL via INTRAVENOUS

## 2018-02-26 MED ORDER — ONDANSETRON HCL 4 MG/2ML IJ SOLN
4.0000 mg | Freq: Once | INTRAMUSCULAR | Status: AC
  Administered 2018-02-26: 4 mg via INTRAVENOUS
  Filled 2018-02-26: qty 2

## 2018-02-26 MED ORDER — ALUM & MAG HYDROXIDE-SIMETH 200-200-20 MG/5ML PO SUSP
30.0000 mL | Freq: Once | ORAL | Status: AC
  Administered 2018-02-26: 30 mL via ORAL
  Filled 2018-02-26: qty 30

## 2018-02-26 MED ORDER — OMEPRAZOLE 20 MG PO CPDR: 20.0000 mg | DELAYED_RELEASE_CAPSULE | Freq: Two times a day (BID) | ORAL | 1 refills | Status: DC

## 2018-02-26 NOTE — ED Provider Notes (Signed)
[name] Provider Note   CSN: [id number] Arrival date & time: 02/26/18  4098     History   Chief Complaint Chief Complaint  Patient presents with  . Abdominal Pain    HPI Donald York is a 58 y.o. male.  The history is provided by the patient. No language interpreter was used.  Abdominal Pain       58 year old male with history of GERD, polysubstance abuse, presenting today for evaluation of abdominal pain.  Patient report for the past 4 days he has had upper abdominal pain.  He described pain as a sharp burning stabbing sensation, waxing waning but has become progressively worse.  Rates pain as "more than 10".  Also endorsed having recurrent nausea vomiting and diarrhea.  Vomitus is nonbloody nonbilious, diarrhea is nonbloody non-mucousy.  He cannot seem to keep anything down.  He did try Mylanta which did help initially.  Pain is somewhat similar to his GERD but more intense.  He admits to occasional alcohol use but states he does not drink that much.  He does not complain of any fever or chills, no chest pain shortness of breath productive cough more than usual and denies any dysuria.  No history of diabetes.  Denies any recent sick contact or recent travel.  Past Medical History:  Diagnosis Date  . Family history of adverse reaction to anesthesia    " [name] "  . Peri-rectal abscess 06/09/2016    Patient Active Problem List   Diagnosis Date Noted  . Perirectal cellulitis 06/09/2016  . AKI (acute kidney injury) ([place]) 06/09/2016  . Tobacco abuse 06/09/2016  . Cocaine use 06/09/2016    Past Surgical History:  Procedure Laterality Date  . NO PAST SURGERIES          Home Medications    Prior to Admission medications   Medication Sig Start Date End Date Taking? Authorizing Provider  alum & mag hydroxide-simeth (MAALOX/MYLANTA) 200-200-20 MG/5ML suspension Take 30 mLs by mouth every 6 (six) hours as needed for indigestion  or heartburn.   Yes [provider]  calcium carbonate (TUMS EX) 750 MG chewable tablet Chew 4 tablets by mouth as needed for heartburn.   Yes [provider]  gabapentin (NEURONTIN) 300 MG capsule Take 1 capsule (300 mg total) by mouth 3 (three) times daily. Patient not taking: Reported on 02/26/2018 08/14/17   Azzie Glatter, FNP  meloxicam (MOBIC) 15 MG tablet Take 1 tablet (15 mg total) by mouth daily. Patient not taking: Reported on 02/26/2018 09/04/17   Azzie Glatter, FNP  methocarbamol (ROBAXIN) 500 MG tablet Take 1 tablet (500 mg total) by mouth 2 (two) times daily. Patient not taking: Reported on 08/14/2017 04/10/17   Fransico Meadow, PA-C  naproxen (NAPROSYN) 500 MG tablet Take 1 tablet (500 mg total) by mouth 2 (two) times daily with a meal. Patient not taking: Reported on 02/26/2018 09/04/17   Azzie Glatter, FNP  omeprazole (PRILOSEC) 20 MG capsule Take 1 capsule (20 mg total) by mouth daily. Patient not taking: Reported on 09/04/2017 08/14/17   Azzie Glatter, FNP    Family History Family History  Problem Relation Age of Onset  . Cancer Maternal Uncle     Social History Social History   Tobacco Use  . Smoking status: Current Some Day Smoker    Packs/day: 0.50    Types: Cigarettes  . Smokeless tobacco: Never Used  Substance Use Topics  . Alcohol  use: Yes    Comment: 40 oz beer, every now and then  . Drug use: Yes    Types: Cocaine, Marijuana     Allergies   Patient has no known allergies.   Review of Systems Review of Systems  Gastrointestinal: Positive for abdominal pain.  All other systems reviewed and are negative.    Physical Exam Updated Vital Signs BP 119/82   Pulse 61   Temp 98.4 F (36.9 C) (Oral)   Resp 16   SpO2 99%   Physical Exam Vitals signs and nursing note reviewed.  Constitutional:      General: He is not in acute distress.    Appearance: He is well-developed.  HENT:     Head: Atraumatic.  Eyes:      Conjunctiva/sclera: Conjunctivae normal.  Neck:     Musculoskeletal: Neck supple.  Cardiovascular:     Rate and Rhythm: Normal rate and regular rhythm.  Pulmonary:     Effort: Pulmonary effort is normal.     Breath sounds: Normal breath sounds.  Abdominal:     Palpations: Abdomen is soft.     Tenderness: There is abdominal tenderness in the epigastric area and left upper quadrant. Negative signs include Murphy's sign and McBurney's sign.  Skin:    Findings: No rash.  Neurological:     Mental Status: He is alert.      ED Treatments / Results  Labs (all labs ordered are listed, but only abnormal results are displayed) Labs Reviewed  URINALYSIS, ROUTINE W REFLEX MICROSCOPIC - Abnormal; Notable for the following components:      Result Value   APPearance HAZY (*)    All other components within normal limits  COMPREHENSIVE METABOLIC PANEL - Abnormal; Notable for the following components:   Creatinine, Ser 1.46 (*)    Calcium 8.2 (*)    Total Protein 5.6 (*)    Albumin 3.2 (*)    GFR calc non Af Amer 53 (*)    All other components within normal limits  CBC WITH DIFFERENTIAL/PLATELET  LIPASE, BLOOD    EKG None  Radiology Ct Abdomen Pelvis Wo Contrast  Result Date: 02/26/2018 CLINICAL DATA:  Abdominal pain, nausea, vomiting and diarrhea. EXAM: CT ABDOMEN AND PELVIS WITHOUT CONTRAST TECHNIQUE: Multidetector CT imaging of the abdomen and pelvis was performed following the standard protocol without IV contrast. COMPARISON:  05/30/2016 FINDINGS: Lower chest: Mild hyperinflation. Minor atelectasis versus scarring dependently. Normal heart size. No pericardial pleural effusion. Hepatobiliary: No focal liver abnormality is seen. No gallstones, gallbladder wall thickening, or biliary dilatation. Pancreas: Unremarkable. No pancreatic ductal dilatation or surrounding inflammatory changes. Spleen: Normal in size without focal abnormality. Adrenals/Urinary Tract: Adrenal glands are  unremarkable. Kidneys are normal, without renal calculi, focal lesion, or hydronephrosis. Bladder is unremarkable. Stomach/Bowel: Negative for bowel obstruction, significant dilatation, ileus, or free air. Normal appearing appendix containing hyperdense material and air extending into the right hemipelvis. No signs of appendicitis. Vascular/Lymphatic: Aortoiliac tortuosity and atherosclerosis. No retroperitoneal hemorrhage or hematoma. No large aneurysm. No adenopathy. Reproductive: Unremarkable by CT. Other: No abdominal wall hernia or abnormality. No abdominopelvic ascites. Musculoskeletal: Degenerative changes of the spine. These changes are most pronounced at L4-5 and L5-S1 with associated facet arthropathy as well. No acute osseous finding. IMPRESSION: No acute abdominal or pelvic finding by noncontrast CT. Abdominal aortic atherosclerosis and tortuosity without aneurysm or acute process Negative for appendicitis Electronically Signed   By: Jerilynn Mages.  Shick M.D.   On: 02/26/2018 16:13    Procedures Procedures (  including critical care time)  Medications Ordered in ED Medications  morphine 4 MG/ML injection 6 mg (6 mg Intravenous Given 02/26/18 1226)  ondansetron (ZOFRAN) injection 4 mg (4 mg Intravenous Given 02/26/18 1226)  sodium chloride 0.9 % bolus 1,000 mL (0 mLs Intravenous Stopped 02/26/18 1256)  alum & mag hydroxide-simeth (MAALOX/MYLANTA) 200-200-20 MG/5ML suspension 30 mL (30 mLs Oral Given 02/26/18 1226)     Initial Impression / Assessment and Plan / ED Course  I have reviewed the triage vital signs and the nursing notes.  Pertinent labs & imaging results that were available during my care of the patient were reviewed by me and considered in my medical decision making (see chart for details).     BP 97/64   Pulse 73   Temp 98.4 F (36.9 C) (Oral)   Resp 16   SpO2 100%    Final Clinical Impressions(s) / ED Diagnoses   Final diagnoses:  Epigastric pain    ED Discharge Orders          Ordered    omeprazole (PRILOSEC) 20 MG capsule  2 times daily before meals     02/26/18 1728    ranitidine (ZANTAC) 150 MG capsule  2 times daily     02/26/18 1728         12:13 PM Patient with upper abdominal pain with associate nausea from diarrhea.  Tenderness to epigastric and left upper quadrant on palpation.  Negative Murphy sign no pain at McBurney's point.  Work-up initiated, will obtain abdominal pelvic CT scan for further evaluation.  Work up unremarkable.  Suspect gastritis/gerd causing pain, doubt ACS or PE.  Pt d/c home with prilosec and zantac.  Return preeacution given.    Domenic Moras, PA-C 02/26/18 2214    Quintella Reichert, MD 03/01/18 [phone]

## 2018-02-26 NOTE — ED Notes (Signed)
Pt alert and oriented in NAD. Pt verbalized understanding of discharge instructions. 

## 2018-02-26 NOTE — ED Notes (Signed)
Pt refused discharge vitals 

## 2018-02-26 NOTE — ED Triage Notes (Signed)
Pt here with c/o abd pain, n/v/d since Tuesday , pt tried OTC meds without relief, pt has history of gerd

## 2018-05-25 ENCOUNTER — Emergency Department (HOSPITAL_COMMUNITY): Payer: Self-pay

## 2018-05-25 ENCOUNTER — Inpatient Hospital Stay (HOSPITAL_COMMUNITY)
Admission: EM | Admit: 2018-05-25 | Discharge: 2018-05-29 | DRG: 382 | Disposition: A | Discharge status: Home / Self Care | Payer: Self-pay | Attending: General Surgery | Admitting: General Surgery

## 2018-05-25 ENCOUNTER — Encounter (HOSPITAL_COMMUNITY): Payer: Self-pay

## 2018-05-25 ENCOUNTER — Other Ambulatory Visit: Payer: Self-pay

## 2018-05-25 DIAGNOSIS — K269 Duodenal ulcer, unspecified as acute or chronic, without hemorrhage or perforation: Secondary | ICD-10-CM | POA: Diagnosis present

## 2018-05-25 DIAGNOSIS — G8929 Other chronic pain: Secondary | ICD-10-CM | POA: Diagnosis present

## 2018-05-25 DIAGNOSIS — N182 Chronic kidney disease, stage 2 (mild): Secondary | ICD-10-CM | POA: Diagnosis present

## 2018-05-25 DIAGNOSIS — B9681 Helicobacter pylori [H. pylori] as the cause of diseases classified elsewhere: Secondary | ICD-10-CM | POA: Diagnosis present

## 2018-05-25 DIAGNOSIS — Z79899 Other long term (current) drug therapy: Secondary | ICD-10-CM

## 2018-05-25 DIAGNOSIS — K265 Chronic or unspecified duodenal ulcer with perforation: Principal | ICD-10-CM | POA: Diagnosis present

## 2018-05-25 DIAGNOSIS — D72829 Elevated white blood cell count, unspecified: Secondary | ICD-10-CM | POA: Diagnosis present

## 2018-05-25 DIAGNOSIS — K275 Chronic or unspecified peptic ulcer, site unspecified, with perforation: Secondary | ICD-10-CM

## 2018-05-25 DIAGNOSIS — M549 Dorsalgia, unspecified: Secondary | ICD-10-CM | POA: Diagnosis present

## 2018-05-25 DIAGNOSIS — F17210 Nicotine dependence, cigarettes, uncomplicated: Secondary | ICD-10-CM | POA: Diagnosis present

## 2018-05-25 LAB — CBC
HCT: 49.8 % (ref 39.0–52.0)
Hemoglobin: 15.9 g/dL (ref 13.0–17.0)
MCH: 29.1 pg (ref 26.0–34.0)
MCHC: 31.9 g/dL (ref 30.0–36.0)
MCV: 91 fL (ref 80.0–100.0)
Platelets: 338 10*3/uL (ref 150–400)
RBC: 5.47 MIL/uL (ref 4.22–5.81)
RDW: 13.3 % (ref 11.5–15.5)
WBC: 6.3 10*3/uL (ref 4.0–10.5)
nRBC: 0 % (ref 0.0–0.2)

## 2018-05-25 LAB — COMPREHENSIVE METABOLIC PANEL
ALBUMIN: 3.9 g/dL (ref 3.5–5.0)
ALT: 14 U/L (ref 0–44)
AST: 21 U/L (ref 15–41)
Alkaline Phosphatase: 63 U/L (ref 38–126)
Anion gap: 13 (ref 5–15)
BUN: 8 mg/dL (ref 6–20)
CO2: 22 mmol/L (ref 22–32)
Calcium: 8.6 mg/dL — ABNORMAL LOW (ref 8.9–10.3)
Chloride: 103 mmol/L (ref 98–111)
Creatinine, Ser: 1.56 mg/dL — ABNORMAL HIGH (ref 0.61–1.24)
GFR calc Af Amer: 56 mL/min — ABNORMAL LOW (ref >60)
GFR calc non Af Amer: 48 mL/min — ABNORMAL LOW (ref >60)
GLUCOSE: 77 mg/dL (ref 70–99)
Potassium: 3.9 mmol/L (ref 3.5–5.1)
Sodium: 138 mmol/L (ref 135–145)
Total Bilirubin: 0.9 mg/dL (ref 0.3–1.2)
Total Protein: 6.3 g/dL — ABNORMAL LOW (ref 6.5–8.1)

## 2018-05-25 LAB — TROPONIN I: Troponin I: <0.03 ng/mL (ref <0.03)

## 2018-05-25 LAB — LIPASE, BLOOD: Lipase: 55 U/L — ABNORMAL HIGH (ref 11–51)

## 2018-05-25 IMAGING — CT CT ABDOMEN AND PELVIS WITH CONTRAST
2 of 5 series · 15 of 46 positions shown, 17 images · IV contrast (Omni 300)
Comparison: [DATE]

CLINICAL DATA: Abdominal pain.

EXAM:
CT ABDOMEN AND PELVIS WITH CONTRAST
TECHNIQUE: Multidetector CT imaging of the abdomen and pelvis was performed
using the standard protocol following bolus administration of
intravenous contrast.
CONTRAST:  80mL OMNIPAQUE IOHEXOL 300 MG/ML  SOLN

[Series 3: a/p w/ 5mm · axial · 0.74mm/px · z∈[-363,+52]mm · 12 of 93 slices shown, 14 images]
[im 5/93  soft-tissue]
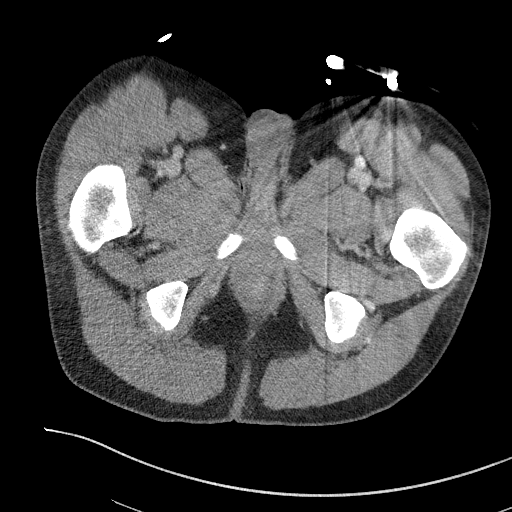
[im 5/93  bone]
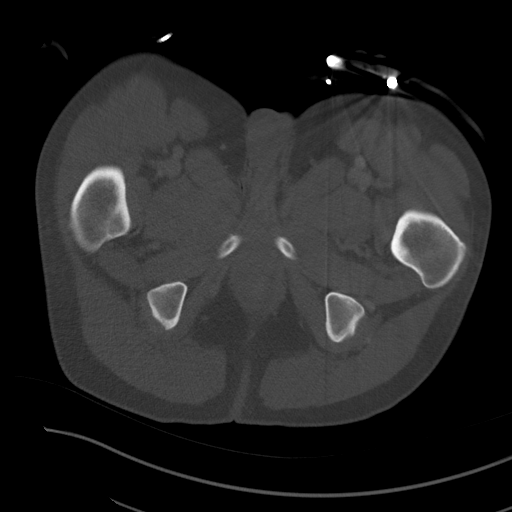
[im 14/93  soft-tissue]
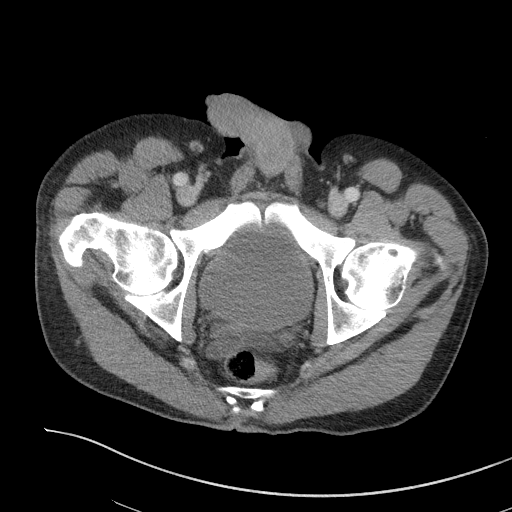
[im 19/93  soft-tissue]
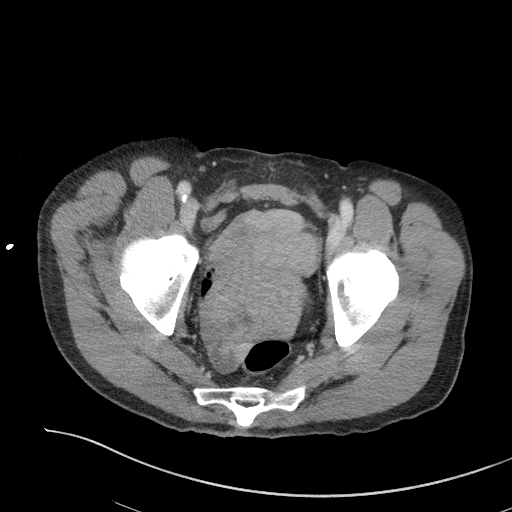
[im 28/93  soft-tissue]
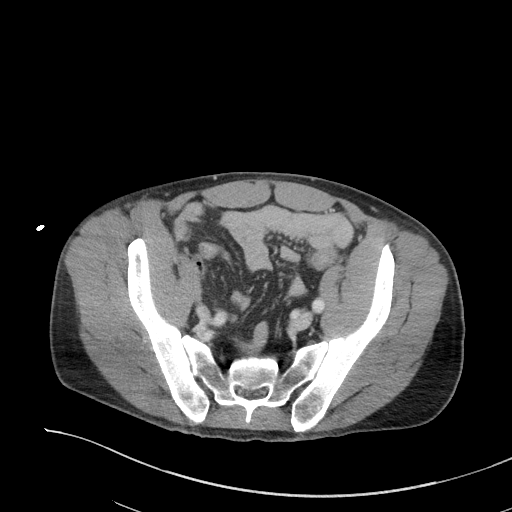
[im 37/93  soft-tissue]
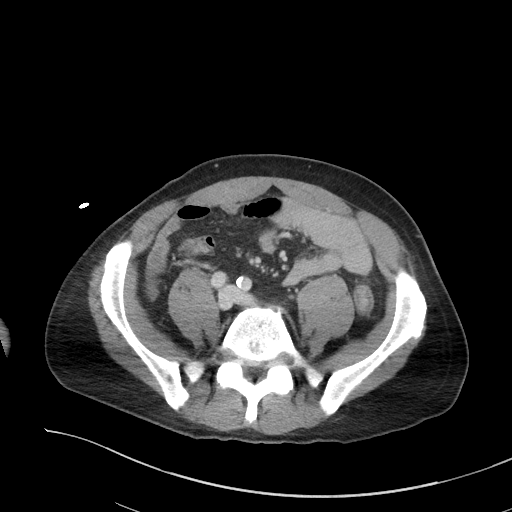
[im 42/93  soft-tissue]
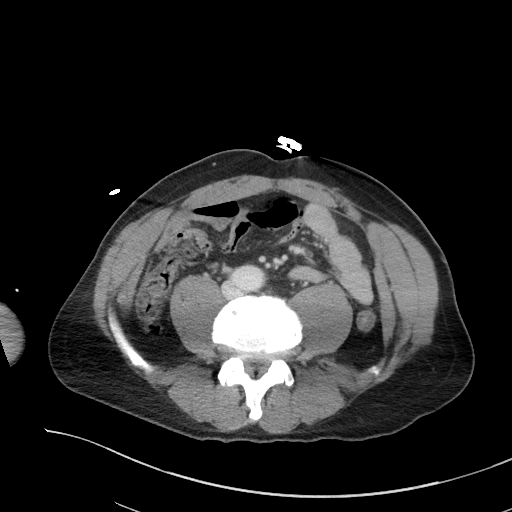
[im 51/93  soft-tissue]
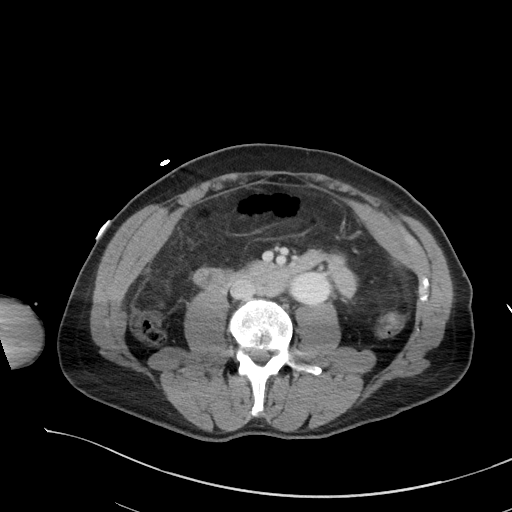
[im 56/93  soft-tissue]
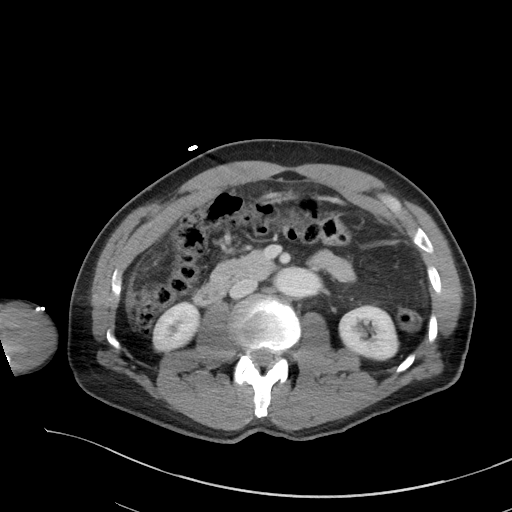
[im 65/93  soft-tissue]
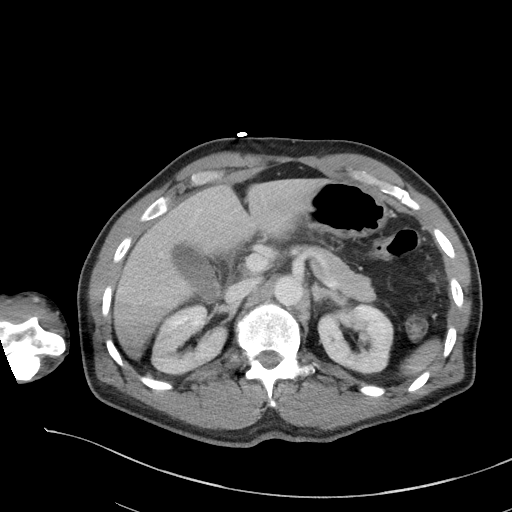
[im 65/93  bone]
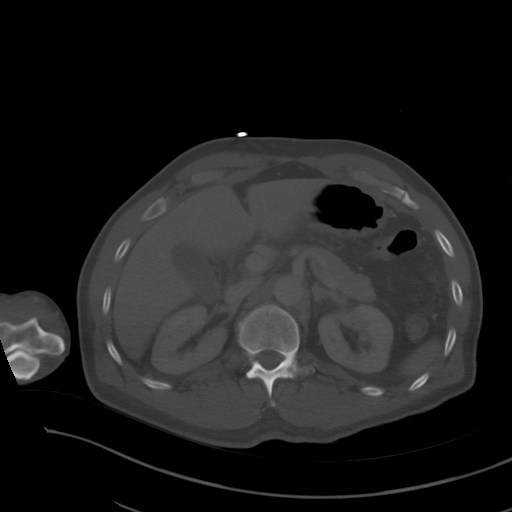
[im 74/93  soft-tissue]
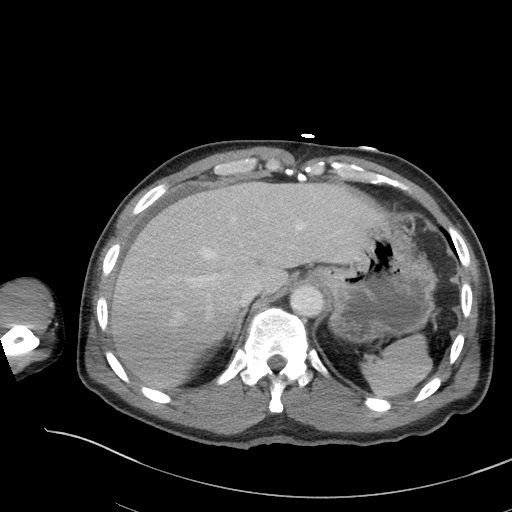
[im 79/93  soft-tissue]
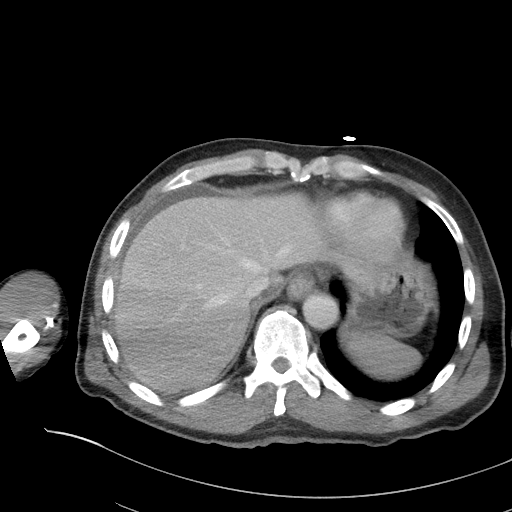
[im 88/93  soft-tissue]
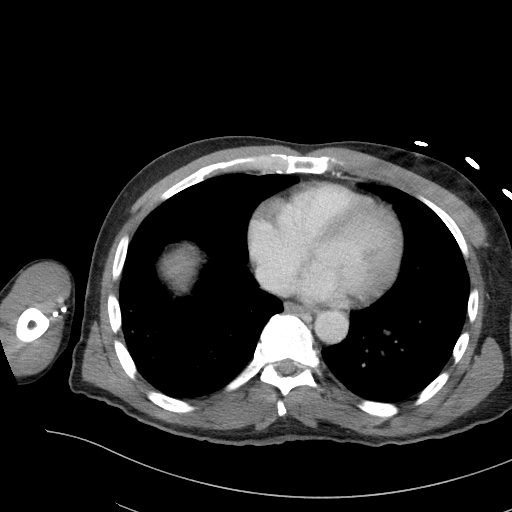

[Series 6: a/p w/ cor · coronal · 0.69mm/px · 3 of 117 slices shown]
[im 39/117  soft-tissue]
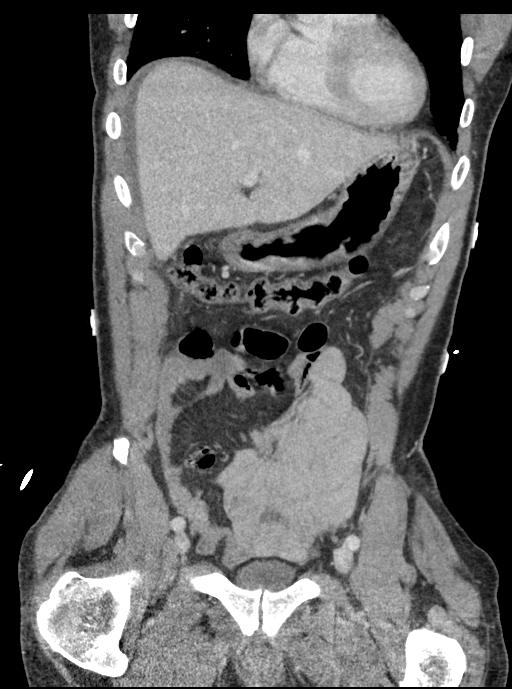
[im 52/117  soft-tissue]
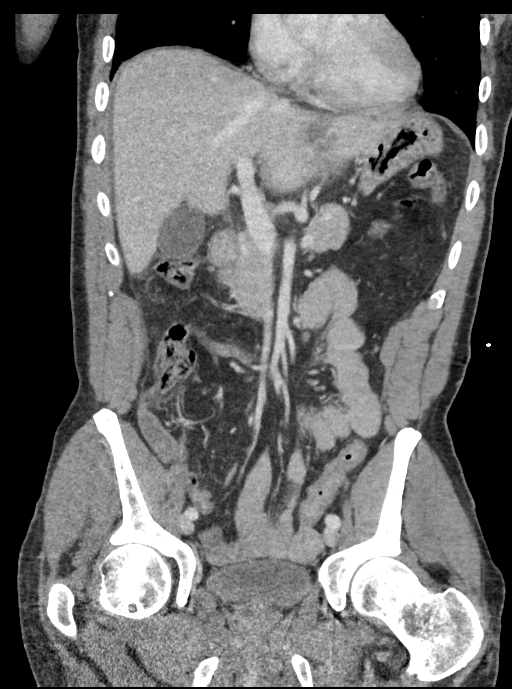
[im 65/117  soft-tissue]
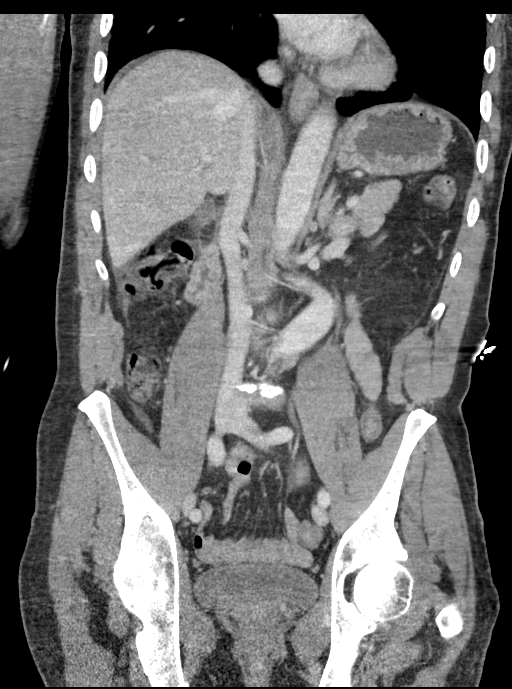

[15 of 46 positions shown; findings below may reference images not displayed]

FINDINGS: Lower chest: Emphysema.  No acute findings.

Hepatobiliary: There is no focal liver abnormality. The gallbladder
appears normal. No biliary dilatation.

Pancreas: Unremarkable. No pancreatic ductal dilatation or
surrounding inflammatory changes.

Spleen: Normal in size without focal abnormality.

Adrenals/Urinary Tract: Normal adrenal glands. The kidneys are
unremarkable. No mass or hydronephrosis. Urinary bladder appears
normal.

Stomach/Bowel: The proximal stomach appears unremarkable. There is
mild wall thickening and edema surrounding the proximal duodenum.
Small low-attenuation area within the anterolateral wall of the
pylorus is identified, best seen on the sagittal images, image 63/7.
Suspicious for peptic ulcer, image 62/7. No surrounding free air or
extravasation of enteric contrast material. The remaining small
bowel loops are unremarkable. The appendix is visualized and appears
normal. The colon appears decompressed.

Vascular/Lymphatic: The infrarenal abdominal aorta is tortuous and
mildly ectatic. No aneurysm. No abdominopelvic adenopathy
identified.

Reproductive: Prostate is unremarkable.

Other: Interval development of a small volume of free fluid within
the right upper quadrant of the abdomen extending over the liver. No
discrete fluid collections identified. There are 3 tiny punctate
foci of gas identified within the porta hepatic region, image [DATE]
and image [DATE]. Suspicious for pneumoperitoneum. No extravasation of
enteric contrast material.

Musculoskeletal: No acute or significant osseous findings. There are
advanced degenerative changes noted at L5-S1.
IMPRESSION: 1. Interval development of free fluid in the right upper quadrant of
the abdomen. Additionally, there are 3 tiny foci of free air within
the porta hepatic region. Mild edema involving the proximal duodenum
noted. Imaging findings are worrisome for peptic ulcer with
perforation. No extravasation of enteric contrast material or large
volume of free intraperitoneal air identified.
2. Emphysema.
3. These results were called by telephone at the time of
interpretation on [DATE] at [DATE] to Dr. KEITA , who
verbally acknowledged these results.

## 2018-05-25 IMAGING — CR CHEST - 2 VIEW
2 series · 2 of 2 positions shown · non-contrast
Comparison: [DATE]

CLINICAL DATA: Chest and abdominal pain, cramping, trouble
breathing

EXAM:
CHEST - 2 VIEW

[chest pa]
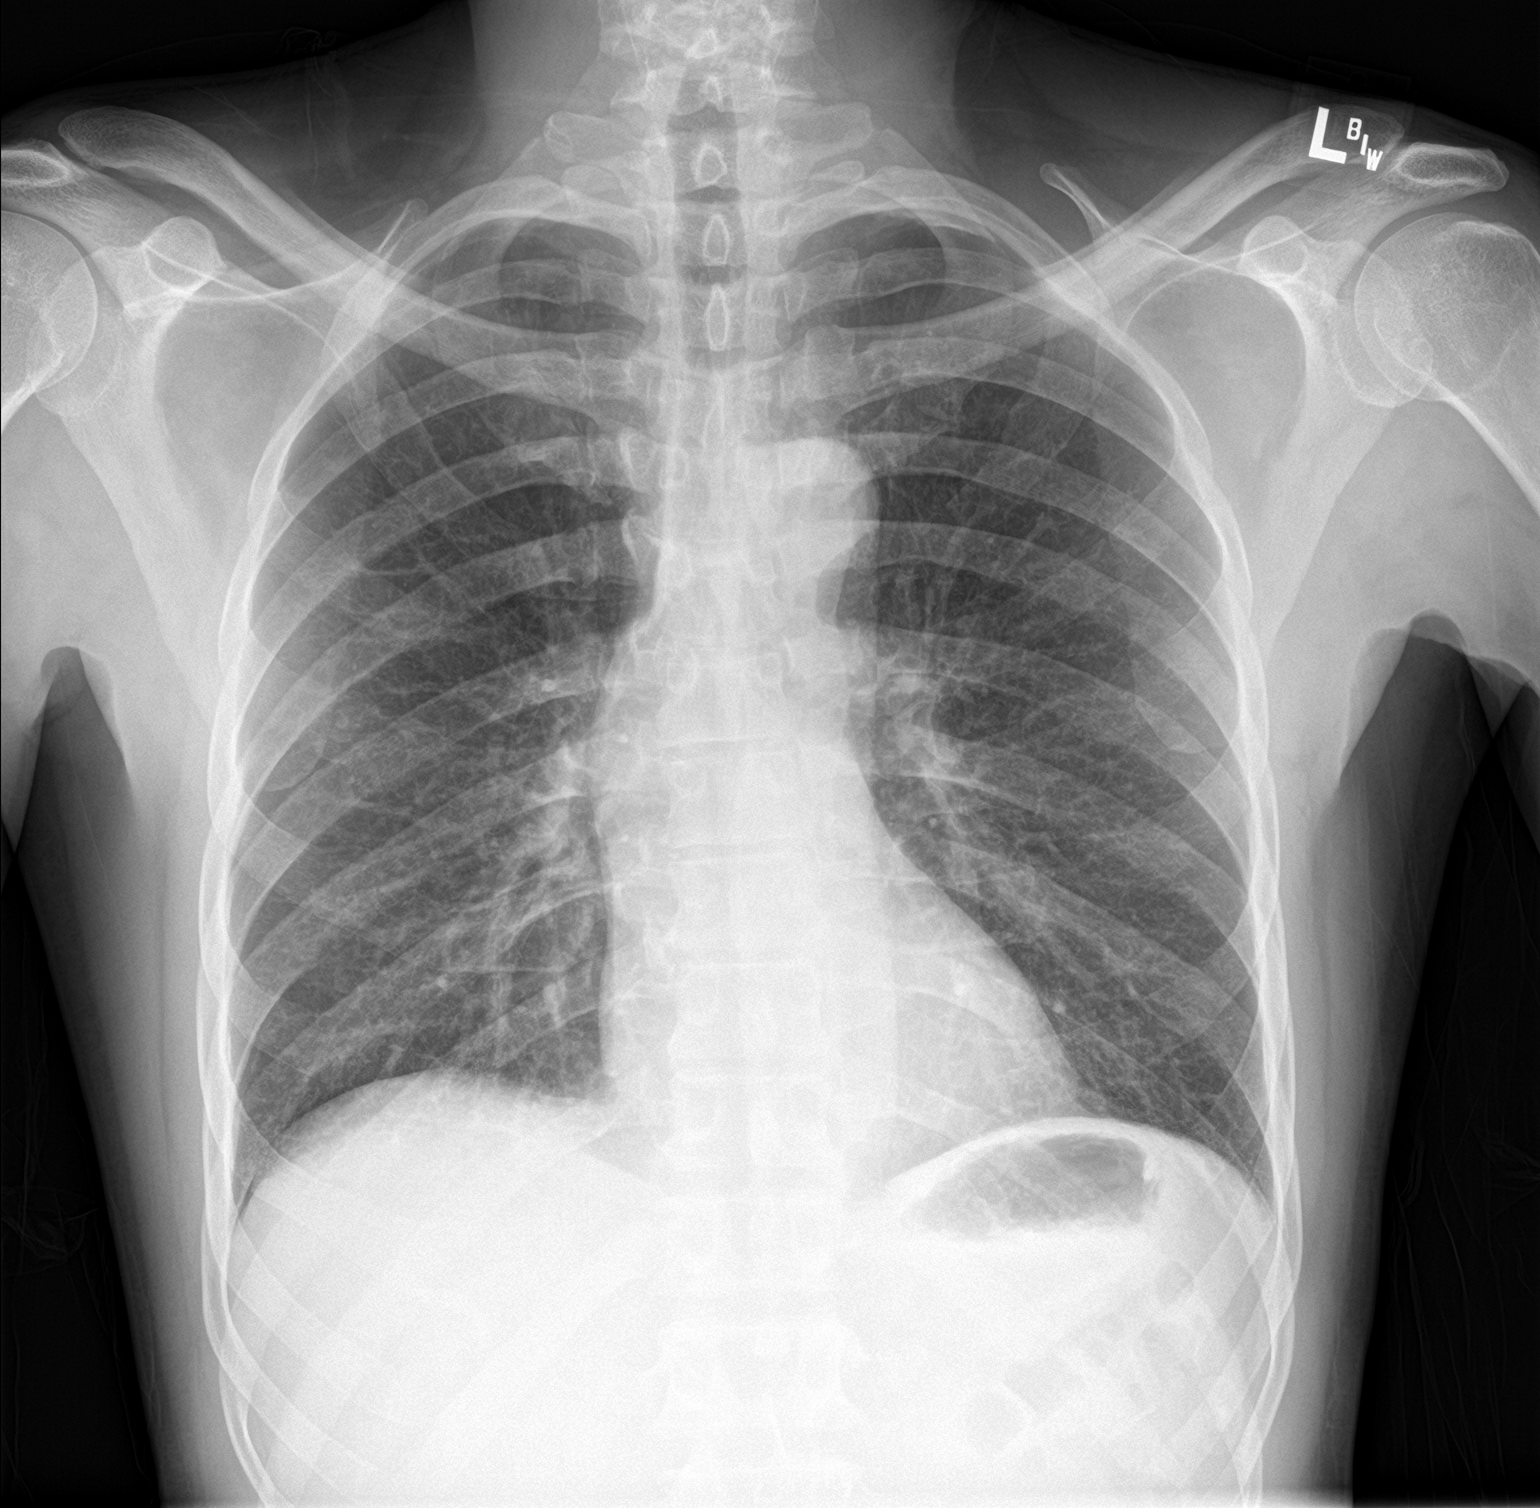

[chest lat]
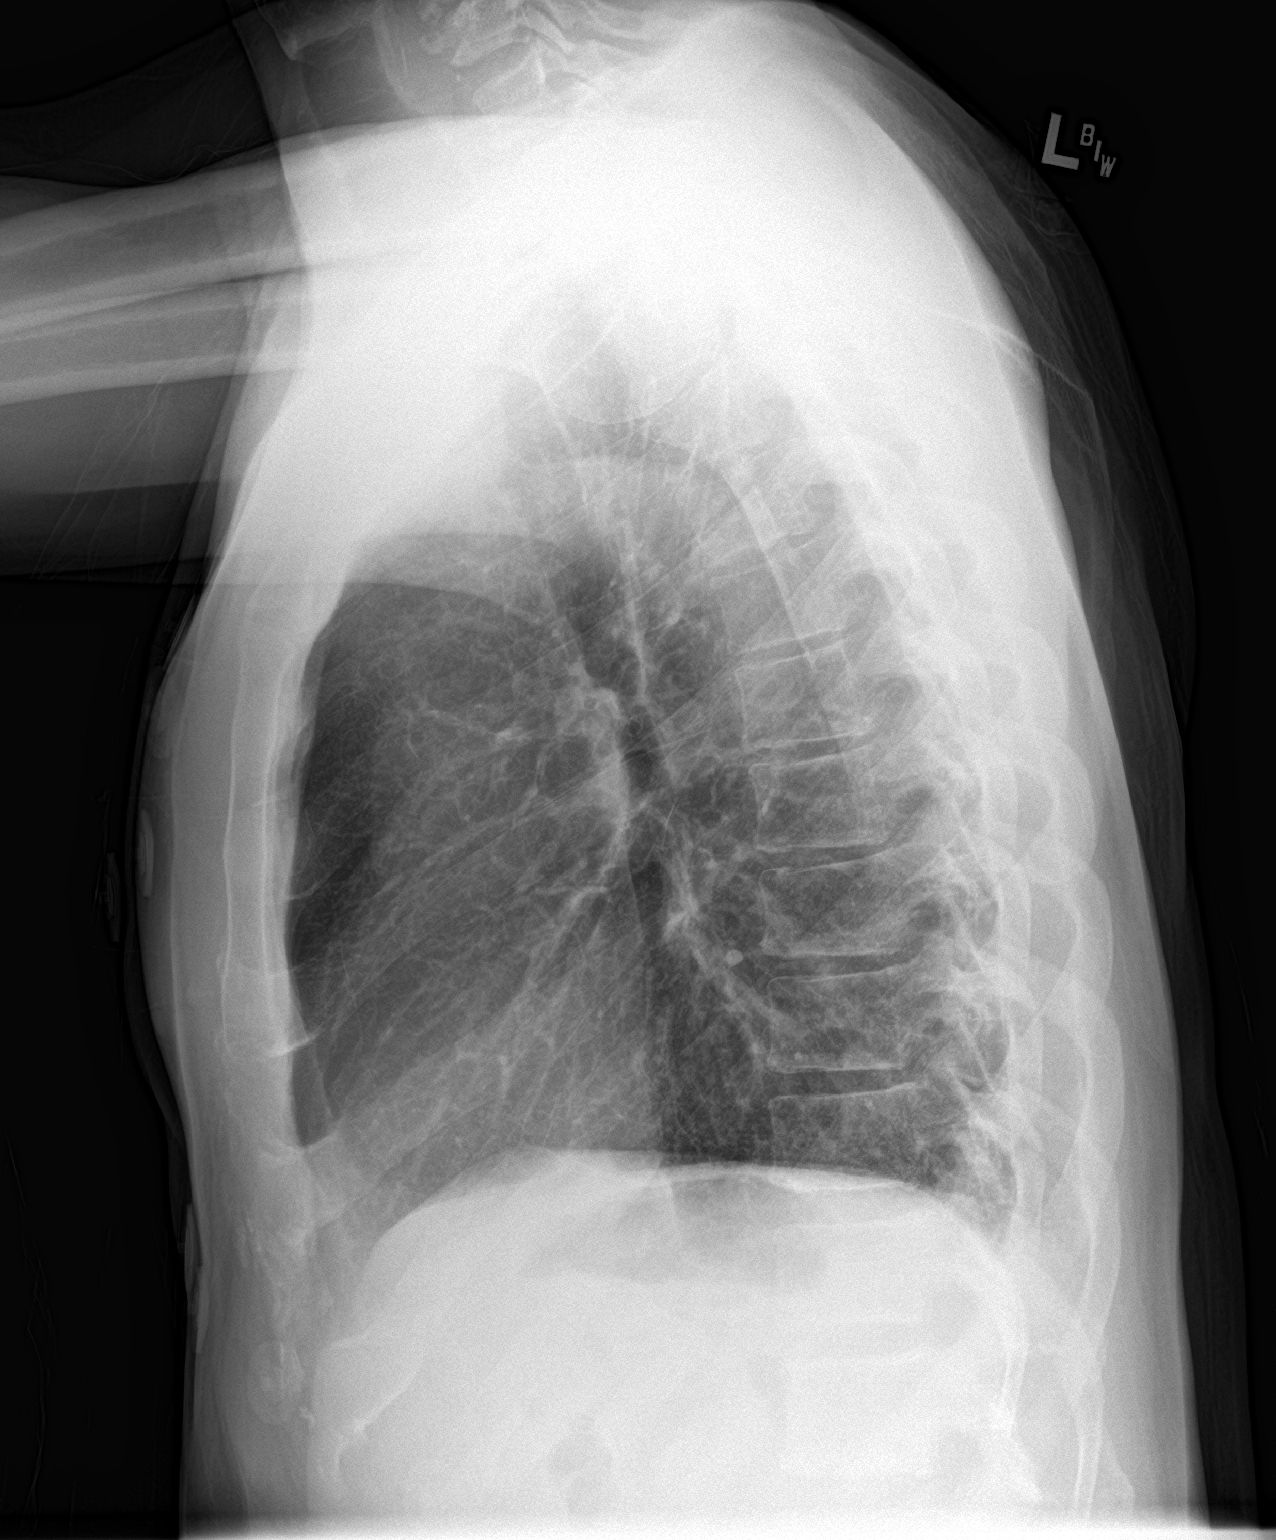

[2 of 2 positions shown; findings below may reference images not displayed]

FINDINGS: Normal heart size, mediastinal contours, and pulmonary vascularity.

Lungs clear.

No pleural effusion or pneumothorax.

Bones unremarkable.
IMPRESSION: No acute abnormalities.

## 2018-05-25 MED ORDER — DEXTROSE-NACL 5-0.45 % IV SOLN
INTRAVENOUS | Status: DC
  Administered 2018-05-25 – 2018-05-26 (×3): via INTRAVENOUS

## 2018-05-25 MED ORDER — ACETAMINOPHEN 325 MG PO TABS: 650.0000 mg | ORAL_TABLET | Freq: Four times a day (QID) | ORAL | Status: DC | PRN

## 2018-05-25 MED ORDER — MORPHINE SULFATE (PF) 2 MG/ML IV SOLN
2.0000 mg | INTRAVENOUS | Status: DC | PRN
  Administered 2018-05-25 – 2018-05-28 (×13): 2 mg via INTRAVENOUS
  Filled 2018-05-25 (×14): qty 1

## 2018-05-25 MED ORDER — SODIUM CHLORIDE 0.9 % IV SOLN
Freq: Once | INTRAVENOUS | Status: AC
  Administered 2018-05-25: 20:00:00 via INTRAVENOUS

## 2018-05-25 MED ORDER — PIPERACILLIN-TAZOBACTAM 3.375 G IVPB 30 MIN
3.3750 g | Freq: Once | INTRAVENOUS | Status: AC
  Administered 2018-05-25: 3.375 g via INTRAVENOUS
  Filled 2018-05-25: qty 50

## 2018-05-25 MED ORDER — SODIUM CHLORIDE 0.9% FLUSH: 3.0000 mL | Freq: Once | INTRAVENOUS | Status: DC

## 2018-05-25 MED ORDER — ONDANSETRON HCL 4 MG/2ML IJ SOLN: 4.0000 mg | Freq: Four times a day (QID) | INTRAMUSCULAR | Status: DC | PRN

## 2018-05-25 MED ORDER — ENOXAPARIN SODIUM 40 MG/0.4ML ~~LOC~~ SOLN
40.0000 mg | SUBCUTANEOUS | Status: DC
  Administered 2018-05-25 – 2018-05-26 (×2): 40 mg via SUBCUTANEOUS
  Filled 2018-05-25 (×4): qty 0.4

## 2018-05-25 MED ORDER — DIPHENHYDRAMINE HCL 50 MG/ML IJ SOLN: 12.5000 mg | Freq: Four times a day (QID) | INTRAMUSCULAR | Status: DC | PRN

## 2018-05-25 MED ORDER — PANTOPRAZOLE SODIUM 40 MG IV SOLR
40.0000 mg | Freq: Once | INTRAVENOUS | Status: AC
  Administered 2018-05-25: 40 mg via INTRAVENOUS
  Filled 2018-05-25: qty 40

## 2018-05-25 MED ORDER — ONDANSETRON 4 MG PO TBDP: 4.0000 mg | ORAL_TABLET | Freq: Four times a day (QID) | ORAL | Status: DC | PRN

## 2018-05-25 MED ORDER — HYDROMORPHONE HCL 1 MG/ML IJ SOLN
1.0000 mg | Freq: Once | INTRAMUSCULAR | Status: AC
  Administered 2018-05-25: 1 mg via INTRAVENOUS
  Filled 2018-05-25: qty 1

## 2018-05-25 MED ORDER — PIPERACILLIN-TAZOBACTAM 3.375 G IVPB
3.3750 g | Freq: Three times a day (TID) | INTRAVENOUS | Status: AC
  Administered 2018-05-26 – 2018-05-28 (×9): 3.375 g via INTRAVENOUS
  Filled 2018-05-25 (×8): qty 50

## 2018-05-25 MED ORDER — DIPHENHYDRAMINE HCL 12.5 MG/5ML PO ELIX: 12.5000 mg | ORAL_SOLUTION | Freq: Four times a day (QID) | ORAL | Status: DC | PRN

## 2018-05-25 MED ORDER — ACETAMINOPHEN 650 MG RE SUPP: 650.0000 mg | Freq: Four times a day (QID) | RECTAL | Status: DC | PRN

## 2018-05-25 MED ORDER — PANTOPRAZOLE SODIUM 40 MG IV SOLR
40.0000 mg | Freq: Two times a day (BID) | INTRAVENOUS | Status: AC
  Administered 2018-05-25 – 2018-05-28 (×7): 40 mg via INTRAVENOUS
  Filled 2018-05-25 (×7): qty 40

## 2018-05-25 MED ORDER — ONDANSETRON HCL 4 MG/2ML IJ SOLN
4.0000 mg | Freq: Once | INTRAMUSCULAR | Status: AC
  Administered 2018-05-25: 4 mg via INTRAVENOUS
  Filled 2018-05-25: qty 2

## 2018-05-25 MED ORDER — METOPROLOL TARTRATE 5 MG/5ML IV SOLN: 5.0000 mg | Freq: Four times a day (QID) | INTRAVENOUS | Status: DC | PRN

## 2018-05-25 MED ORDER — ONDANSETRON HCL 4 MG PO TABS: 4.0000 mg | ORAL_TABLET | Freq: Once | ORAL | Status: DC

## 2018-05-25 MED ORDER — SODIUM CHLORIDE 0.9 % IV BOLUS
1000.0000 mL | Freq: Once | INTRAVENOUS | Status: AC
  Administered 2018-05-25: 1000 mL via INTRAVENOUS

## 2018-05-25 MED ORDER — IOHEXOL 300 MG/ML  SOLN
80.0000 mL | Freq: Once | INTRAMUSCULAR | Status: AC | PRN
  Administered 2018-05-25: 80 mL via INTRAVENOUS

## 2018-05-25 NOTE — H&P (Signed)
Reason for Consult: abdominal pain Referring Physician: Trystan, Donald York is an 58 y.o. male.  HPI: 58 yo male with 1 day of epigastric abdominal pain. Pain is in his epigastrium. HE has never had a pain like this. He has nausea and vomiting and dry mouth. He smokes 1 ppd. He drinks 1-2 40oz malt liquor a week. He had ulcers 30 years ago. He recently filled a prescription for antacid due to reflux symptoms.  Past Medical History:  Diagnosis Date  . Family history of adverse reaction to anesthesia    " [name] "  . Peri-rectal abscess 06/09/2016    Past Surgical History:  Procedure Laterality Date  . NO PAST SURGERIES      Family History  Problem Relation Age of Onset  . Cancer Maternal Uncle     Social History:  reports that he has been smoking cigarettes. He has been smoking about 0.50 packs per day. He has never used smokeless tobacco. He reports current alcohol use. He reports current drug use. Drugs: Cocaine and Marijuana.  Allergies: No Known Allergies  Medications: I have reviewed the patient's current medications.  Results for orders placed or performed during the hospital encounter of 05/25/18 (from the past 48 hour(s))  CBC     Status: None   Collection Time: 05/25/18  5:12 PM  Result Value Ref Range   WBC 6.3 4.0 - 10.5 K/uL   RBC 5.47 4.22 - 5.81 MIL/uL   Hemoglobin 15.9 13.0 - 17.0 g/dL   HCT 49.8 39.0 - 52.0 %   MCV 91.0 80.0 - 100.0 fL   MCH 29.1 26.0 - 34.0 pg   MCHC 31.9 30.0 - 36.0 g/dL   RDW 13.3 11.5 - 15.5 %   Platelets 338 150 - 400 K/uL   nRBC 0.0 0.0 - 0.2 %    Comment: Performed at [facility], [place] [address], [name], [place] [address]  Troponin I - ONCE - STAT     Status: None   Collection Time: 05/25/18  5:12 PM  Result Value Ref Range   Troponin I <0.03 <0.03 ng/mL    Comment: Performed at [facility] [address], [name], [place] [address]  Comprehensive metabolic panel     Status: Abnormal   Collection Time: 05/25/18  5:12 PM  Result Value Ref Range   Sodium 138 135 - 145 mmol/L   Potassium 3.9 3.5 - 5.1 mmol/L   Chloride 103 98 - 111 mmol/L   CO2 22 22 - 32 mmol/L   Glucose, [name], [name] [name], [name], blood     Status: Abnormal   Collection Time: 05/25/18  5:12 PM  Result Value Ref Range   Lipase 55 (  H) 11 - 51 U/L    Comment: Performed at [facility], [name] [name], [place] [address]    Dg Chest 2 View  Result Date: 05/25/2018 CLINICAL DATA:  Chest and abdominal pain, cramping, trouble breathing EXAM: CHEST - 2 VIEW COMPARISON:  04/18/2006 FINDINGS: Normal heart size, mediastinal contours, and pulmonary vascularity. Lungs clear. No pleural effusion or pneumothorax. Bones unremarkable. IMPRESSION: No acute abnormalities. Electronically Signed   By: Lavonia Dana M.D.   On: 05/25/2018 17:26   Ct Abdomen Pelvis W Contrast  Result Date: 05/25/2018 CLINICAL DATA:  Abdominal pain. EXAM: CT ABDOMEN AND PELVIS WITH CONTRAST TECHNIQUE: Multidetector CT imaging of the abdomen and pelvis was performed using the standard protocol following bolus administration of intravenous contrast. CONTRAST:  23mL OMNIPAQUE IOHEXOL 300 MG/ML  SOLN COMPARISON:  02/26/2018 FINDINGS: Lower chest: Emphysema.  No acute findings. Hepatobiliary: There is no focal liver abnormality. The gallbladder appears normal. No biliary dilatation. Pancreas: Unremarkable. No pancreatic ductal dilatation or surrounding inflammatory  changes. Spleen: Normal in size without focal abnormality. Adrenals/Urinary Tract: Normal adrenal glands. The kidneys are unremarkable. No mass or hydronephrosis. Urinary bladder appears normal. Stomach/Bowel: The proximal stomach appears unremarkable. There is mild wall thickening and edema surrounding the proximal duodenum. Small low-attenuation area within the anterolateral wall of the pylorus is identified, best seen on the sagittal images, image 63/7. Suspicious for peptic ulcer, [name], [name], there are 3 tiny foci of free air within the porta hepatic region. Mild edema involving the proximal duodenum noted. Imaging findings are worrisome for peptic ulcer with perforation. No extravasation of enteric contrast material or large volume of free intraperitoneal air identified. 2. Emphysema. 3. These results were called by telephone at the time of interpretation on 05/25/2018 at 7:36 pm to Dr. Duffy Bruce , who verbally acknowledged these results. Electronically Signed    By: Kerby Moors M.D.   On: 05/25/2018 19:36    Review of Systems  Constitutional: Positive for weight loss. Negative for chills and fever.  HENT: Negative for hearing loss.   Eyes: Negative for blurred vision and double vision.  Respiratory: Negative for cough and hemoptysis.   Cardiovascular: Negative for chest pain and palpitations.  Gastrointestinal: Positive for abdominal pain and vomiting. Negative for nausea.  Genitourinary: Negative for dysuria and urgency.  Musculoskeletal: Negative for myalgias and neck pain.  Skin: Negative for itching and rash.  Neurological: Negative for dizziness, tingling and headaches.  Endo/Heme/Allergies: Does not bruise/bleed easily.  Psychiatric/Behavioral: Negative for depression and suicidal ideas.   Blood pressure (!) 154/60, pulse 85, temperature 98.2 F (36.8 C), temperature source Oral, resp. rate 16, SpO2 94 %. Physical Exam  Vitals reviewed. Constitutional: He is oriented to  person, place, and time. He appears well-developed and well-nourished.  HENT:  Head: Normocephalic and atraumatic.  Eyes: Pupils are equal, round, and reactive to light. Conjunctivae and EOM are normal.  Neck: Normal range of motion. Neck supple.  Cardiovascular: Normal rate and regular rhythm.  Respiratory: Effort normal and breath sounds normal.  GI: Soft. Bowel sounds are normal. He exhibits no distension. There is abdominal tenderness in the right upper quadrant and epigastric area. There is guarding.  Musculoskeletal: Normal range of motion.  Neurological: He is alert and oriented to person, place, and time.  Skin: Skin is warm and dry.  Psychiatric: He has a normal mood and affect. His behavior is normal.      Assessment/Plan: 58 yo male with abdominal pain and findings concerning for perforated ulcer. He has no leukocytosis and only a small amount of free fluid and 2 dots of possible air. -admit to med/surg -broad spectrum abx -IV PPI -pain control  -will reexamine tomorrow, may require contrast study for operative exploration if fails to improve -He notes NSAID use, I have recommended he stop this due to the ucler complication  Tobacco  Use -recommend cessation due to ulcer complication  Arta Bruce Zacharee Gaddie 05/25/2018, 8:31 PM

## 2018-05-25 NOTE — Progress Notes (Signed)
Pharmacy Antibiotic Note  Donald York is a 58 y.o. male admitted on 05/25/2018 with abdominal and chest pain, concerning for perforated ulcer.  Pharmacy has been consulted for Zosyn dosing for intra-abdominal coverage.  SCr 1.56, nCrCL 52 ml/min, afebrile, WBC WNL.  Plan: Zosyn EID 3.375gm IV Q8H Monitor renal fxn, clinical progress F/U updated height and weight     Temp (24hrs), Avg:98.2 F (36.8 C), Min:98.2 F (36.8 C), Max:98.2 F (36.8 C)  Recent Labs  Lab 05/25/18 1712  WBC 6.3  CREATININE 1.56*    CrCl cannot be calculated (Unknown ideal weight.).    No Known Allergies   Hoy Fallert D. Mina Marble, PharmD, BCPS, [place] 05/25/2018, 8:39 PM

## 2018-05-25 NOTE — ED Triage Notes (Signed)
Pt here for abd pain that began at 1600.  When arriving to hospital he began to have chest pain.  Pt states no emesis until arriving to ED.  Donated plasma on Sunday.

## 2018-05-25 NOTE — ED Notes (Signed)
ED TO INPATIENT HANDOFF REPORT  ED Nurse Name and Phone #: [phone] Lucita Ferrara Name/Age/Gender Donald York 58 y.o. male Room/Bed: 030C/030C  Code Status   Code Status: Full Code  Home/SNF/Other Home Patient oriented to: self, place, time and situation Is this baseline? Yes   Triage Complete: Triage complete  Chief Complaint cp  Triage Note Pt here for abd pain that began at 1600.  When arriving to hospital he began to have chest pain.  Pt states no emesis until arriving to ED.  Donated plasma on Sunday.    Allergies No Known Allergies  Level of Care/Admitting Diagnosis ED Disposition    ED Disposition Condition Comment   Admit  Hospital Area: [facility] [[id number]]  Level of Care: Med-Surg [16]  Diagnosis: Duodenal ulcer [[id number]]  Admitting Physician: CCS, MD [[id number]]  Attending Physician: CCS, MD [[id number]]  Estimated length of stay: past midnight tomorrow  Certification:: I certify this patient will need inpatient services for at least 2 midnights  PT Class (Do Not Modify): Inpatient [101]  PT Acc Code (Do Not Modify): Private [1]       B Medical/Surgery History Past Medical History:  Diagnosis Date  . Family history of adverse reaction to anesthesia    " [name] "  . Peri-rectal abscess 06/09/2016   Past Surgical History:  Procedure Laterality Date  . NO PAST SURGERIES       A IV Location/Drains/Wounds Patient Lines/Drains/Airways Status   Active Line/Drains/Airways    Name:   Placement date:   Placement time:   Site:   Days:   Peripheral IV 05/25/18 Right Antecubital   05/25/18    1845    Antecubital   less than 1   Incision (Closed) 06/10/16 Buttocks Left   06/10/16    2040     71 4   Wound / Incision (Open or Dehisced) 06/10/16 Groin Left draining abscess   06/10/16    2040    Groin   714          Intake/Output Last 24 hours No intake or output data in the 24 hours ending 05/25/18 2051  Labs/Imaging Results for orders placed  or performed during the hospital encounter of 05/25/18 (from the past 48 hour(s))  CBC     Status: None   Collection Time: 05/25/18  5:12 PM  Result Value Ref Range   WBC 6.3 4.0 - 10.5 K/uL   RBC 5.47 4.22 - 5.81 MIL/uL   Hemoglobin 15.9 13.0 - 17.0 g/dL   HCT 49.8 39.0 - 52.0 %   MCV 91.0 80.0 - 100.0 fL   MCH 29.1 26.0 - 34.0 pg   MCHC 31.9 30.0 - 36.0 g/dL   RDW 13.3 11.5 - 15.5 %   Platelets 338 150 - 400 K/uL   nRBC 0.0 0.0 - 0.2 %    Comment: Performed at [facility], [place] [address], [place], [place] [address]  Troponin I - ONCE - STAT     Status: None   Collection Time: 05/25/18  5:12 PM  Result Value Ref Range   Troponin I <0.03 <0.03 ng/mL    Comment: Performed at [facility] [address], [place], [place] [address]  Comprehensive metabolic panel     Status: Abnormal   Collection Time: 05/25/18  5:12 PM  Result Value Ref Range   Sodium 138 135 - 145 mmol/L   Potassium 3.9 3.5 - 5.1 mmol/L   Chloride 103  98 - 111 mmol/L   CO2 22 22 - 32 mmol/L   Glucose, [name], Ser 1.56 (H) 0.61 - 1.24 mg/dL   Calcium 8.6 (L) 8.9 - 10.3 mg/dL   Total Protein 6.3 (L) 6.5 - 8.1 g/dL   Albumin 3.9 3.5 - 5.0 g/dL   AST 21 15 - 41 U/L   ALT 14 0 - 44 U/L   Alkaline Phosphatase 63 38 - 126 U/L   Total Bilirubin 0.9 0.3 - 1.2 mg/dL   GFR calc non Af Amer 48 (L) >60 mL/min   GFR calc Af Amer 56 (L) >60 mL/min   Anion gap 13 5 - 15    Comment: Performed at [facility] [address], [place], [place] [address]  Lipase, blood     Status: Abnormal   Collection Time: 05/25/18  5:12 PM  Result Value Ref Range   Lipase 55 (H) 11 - 51 U/L    Comment: Performed at [facility] [address], [place], [place] [address]   Dg Chest 2 View  Result Date: 05/25/2018 CLINICAL DATA:  Chest and abdominal pain, cramping, trouble breathing EXAM: CHEST - 2 VIEW COMPARISON:  04/18/2006 FINDINGS: Normal heart size,  mediastinal contours, and pulmonary vascularity. Lungs clear. No pleural effusion or pneumothorax. Bones unremarkable. IMPRESSION: No acute abnormalities. Electronically Signed   By: Lavonia Dana M.D.   On: 05/25/2018 17:26   Ct Abdomen Pelvis W Contrast  Result Date: 05/25/2018 CLINICAL DATA:  Abdominal pain. EXAM: CT ABDOMEN AND PELVIS WITH CONTRAST TECHNIQUE: Multidetector CT imaging of the abdomen and pelvis was performed using the standard protocol following bolus administration of intravenous contrast. CONTRAST:  22mL OMNIPAQUE IOHEXOL 300 MG/ML  SOLN COMPARISON:  02/26/2018 FINDINGS: Lower chest: Emphysema.  No acute findings. Hepatobiliary: There is no focal liver abnormality. The gallbladder appears normal. No biliary dilatation. Pancreas: Unremarkable. No pancreatic ductal dilatation or surrounding inflammatory changes. Spleen: Normal in size without focal abnormality. Adrenals/Urinary Tract: Normal adrenal glands. The kidneys are unremarkable. No mass or hydronephrosis. Urinary bladder appears normal. Stomach/Bowel: The proximal stomach appears unremarkable. There is mild wall thickening and edema surrounding the proximal duodenum. Small low-attenuation area within the anterolateral wall of the pylorus is identified, best seen on the sagittal images, image 63/7. Suspicious for peptic ulcer, image 62/7. No surrounding free air or extravasation of enteric contrast material. The remaining small bowel loops are unremarkable. The appendix is visualized and appears normal. The colon appears decompressed. Vascular/Lymphatic: The infrarenal abdominal aorta is tortuous and mildly ectatic. No aneurysm. No abdominopelvic adenopathy identified. Reproductive: Prostate is unremarkable. Other: Interval development of a small volume of free fluid within the right upper quadrant of the abdomen extending over the liver. No discrete fluid collections identified. There are 3 tiny punctate foci of gas identified within the  porta hepatic region, [name], there are 3 tiny foci of free air within the porta hepatic region. Mild edema involving the proximal duodenum noted. Imaging findings are worrisome for peptic ulcer with perforation. No extravasation of enteric contrast material or large volume of free intraperitoneal air identified. 2. Emphysema. 3. These results were called by  telephone at the time of interpretation on 05/25/2018 at 7:36 pm to Dr. Duffy Bruce , who verbally acknowledged these results. Electronically Signed   By: Kerby Moors M.D.   On: 05/25/2018 19:36    Pending Labs Unresulted Labs (From admission, onward)    Start     Ordered   06/01/18 0500  Creatinine, serum  (enoxaparin (LOVENOX)    CrCl >/= 30 ml/min)  Weekly,   R    Comments:  while on enoxaparin therapy    05/25/18 2031   05/26/18 0500  Comprehensive metabolic panel  Daily,   R     05/25/18 2031   05/26/18 0500  CBC  Daily,   R     05/25/18 2031   05/25/18 2030  CBC  (enoxaparin (LOVENOX)    CrCl >/= 30 ml/min)  Once,   R    Comments:  Baseline for enoxaparin therapy IF NOT ALREADY DRAWN.  Notify MD if PLT < 100 K.    05/25/18 2031   05/25/18 2030  Creatinine, serum  (enoxaparin (LOVENOX)    CrCl >/= 30 ml/min)  Once,   R    Comments:  Baseline for enoxaparin therapy IF NOT ALREADY DRAWN.    05/25/18 2031   05/25/18 2030  HIV antibody (Routine Testing)  Once,   R     05/25/18 2031          Vitals/Pain Today's Vitals   05/25/18 1659 05/25/18 1708 05/25/18 1915 05/25/18 1927  BP:  (!) 154/60    Pulse:  89 85   Resp:  20 16   Temp:  98.2 F (36.8 C)    TempSrc:  Oral    SpO2:  100% 94%   PainSc: 6     10-Worst pain ever    Isolation Precautions No active isolations  Medications Medications  sodium chloride flush (NS) 0.9 % injection 3 mL (has no administration in time range)  ondansetron (ZOFRAN) tablet 4 mg (4 mg Oral Refused 05/25/18 1725)  piperacillin-tazobactam (ZOSYN) IVPB 3.375 g (3.375 g Intravenous New Bag/Given 05/25/18 2023)  enoxaparin (LOVENOX) injection 40 mg (has no administration in time range)  dextrose 5 %-0.45 % sodium chloride infusion (has no administration in time range)  acetaminophen (TYLENOL) tablet 650 mg (has no administration in time range)    Or  acetaminophen (TYLENOL) suppository 650 mg (has no administration in time range)  morphine 2 MG/ML injection 2 mg (has no administration in time range)  ondansetron (ZOFRAN-ODT) disintegrating tablet 4 mg (has no administration in time range)    Or  ondansetron (ZOFRAN) injection 4 mg (has no administration in time range)  metoprolol tartrate (LOPRESSOR) injection 5 mg (has no administration in time range)  diphenhydrAMINE (BENADRYL) 12.5 MG/5ML elixir 12.5 mg (has no administration in time range)    Or  diphenhydrAMINE (BENADRYL) injection 12.5 mg (has no administration in time range)  pantoprazole (PROTONIX) injection 40 mg (has no administration in time range)  piperacillin-tazobactam (ZOSYN) IVPB 3.375 g (has no administration in time range)  HYDROmorphone (DILAUDID) injection 1 mg (1 mg Intravenous Given 05/25/18 1927)  ondansetron (ZOFRAN) injection 4 mg (4 mg Intravenous Given 05/25/18 1929)  sodium chloride 0.9 % bolus 1,000 mL (1,000 mLs Intravenous New Bag/Given 05/25/18 1927)  iohexol (OMNIPAQUE) 300 MG/ML solution 80 mL (80 mLs Intravenous Contrast Given 05/25/18 1905)  pantoprazole (PROTONIX) injection 40 mg (40 mg Intravenous Given 05/25/18 2020)  0.9 %  sodium chloride infusion ( Intravenous New Bag/Given 05/25/18 2024)  Mobility walks Low fall risk   Focused  Assessments    R Recommendations: See Admitting Provider Note  Report given to:   Additional Notes:

## 2018-05-25 NOTE — ED Notes (Signed)
Patient transported to CT 

## 2018-05-25 NOTE — ED Provider Notes (Signed)
[facility] EMERGENCY DEPARTMENT Provider Note   CSN: [id number] Arrival date & time: 05/25/18  1638    History   Chief Complaint Chief Complaint  Patient presents with   Chest Pain   Abdominal Pain    HPI Donald York is a 58 y.o. male.     HPI   58 yo M with h/o cocaine abuse, peri-rectal abscess here w/ abd pain. Pt reports he was at work today when around 4 PM he developed acute onset of severe epigastric and LLQ abd pain. He has had nausea, vomiting. Pain is aching, stabbing, and severe. It seems to naturally wax and wane but is always present. Worse w/ movement and palpation. No alleviating factors. He felt well prior to the onset of pain. No suspicious food intake. Denies any flank pain, urinary sx. No h/o gallstones. No recent increased alcohol use. No h/o pancreatitis. No fever, chills. No diarrhea. No other complaints.  Past Medical History:  Diagnosis Date   Family history of adverse reaction to anesthesia    " [name] "   Peri-rectal abscess 06/09/2016    Patient Active Problem List   Diagnosis Date Noted   Duodenal ulcer 05/25/2018   Perirectal cellulitis 06/09/2016   AKI (acute kidney injury) ([place]) 06/09/2016   Tobacco abuse 06/09/2016   Cocaine use 06/09/2016    Past Surgical History:  Procedure Laterality Date   NO PAST SURGERIES          Home Medications    Prior to Admission medications   Medication Sig Start Date End Date Taking? Authorizing Provider  alum & mag hydroxide-simeth (MAALOX/MYLANTA) 200-200-20 MG/5ML suspension Take 30 mLs by mouth every 6 (six) hours as needed for indigestion or heartburn.    [provider]  calcium carbonate (TUMS EX) 750 MG chewable tablet Chew 4 tablets by mouth as needed for heartburn.    [provider]  omeprazole (PRILOSEC) 20 MG capsule Take 1 capsule (20 mg total) by mouth 2 (two) times daily before a meal. 02/26/18   Domenic Moras, PA-C  ranitidine (ZANTAC)  150 MG capsule Take 1 capsule (150 mg total) by mouth 2 (two) times daily. 02/26/18   Domenic Moras, PA-C    Family History Family History  Problem Relation Age of Onset   Cancer Maternal Uncle     Social History Social History   Tobacco Use   Smoking status: Current Some Day Smoker    Packs/day: 0.50    Types: Cigarettes   Smokeless tobacco: Never Used  Substance Use Topics   Alcohol use: Yes    Comment: 40 oz beer, every now and then   Drug use: Yes    Types: Cocaine, Marijuana     Allergies   Patient has no known allergies.   Review of Systems Review of Systems  Constitutional: Positive for fatigue. Negative for chills and fever.  HENT: Negative for congestion and rhinorrhea.   Eyes: Negative for visual disturbance.  Respiratory: Negative for cough, shortness of breath and wheezing.   Cardiovascular: Negative for chest pain and leg swelling.  Gastrointestinal: Positive for abdominal pain, nausea and vomiting. Negative for diarrhea.  Genitourinary: Negative for dysuria and flank pain.  Musculoskeletal: Negative for neck pain and neck stiffness.  Skin: Negative for rash and wound.  Allergic/Immunologic: Negative for immunocompromised state.  Neurological: Negative for syncope, weakness and headaches.  All other systems reviewed and are negative.    Physical Exam Updated Vital Signs BP (!) 154/60  Pulse 85    Temp 98.2 F (36.8 C) (Oral)    Resp 16    Ht 6' (1.829 m)    Wt 81.2 kg    SpO2 94%    BMI 24.28 kg/m   Physical Exam Vitals signs and nursing note reviewed.  Constitutional:      General: He is not in acute distress.    Appearance: He is well-developed.  HENT:     Head: Normocephalic and atraumatic.  Eyes:     Conjunctiva/sclera: Conjunctivae normal.  Neck:     Musculoskeletal: Neck supple.  Cardiovascular:     Rate and Rhythm: Normal rate and regular rhythm.     Heart sounds: Normal heart sounds. No murmur. No friction rub.  Pulmonary:      Effort: Pulmonary effort is normal. No respiratory distress.     Breath sounds: Normal breath sounds. No wheezing or rales.  Abdominal:     General: There is no distension.     Palpations: Abdomen is soft.     Tenderness: There is generalized abdominal tenderness and tenderness in the epigastric area.  Skin:    General: Skin is warm.     Capillary Refill: Capillary refill takes less than 2 seconds.  Neurological:     Mental Status: He is alert and oriented to person, place, and time.     Motor: No abnormal muscle tone.      ED Treatments / Results  Labs (all labs ordered are listed, but only abnormal results are displayed) Labs Reviewed  COMPREHENSIVE METABOLIC PANEL - Abnormal; Notable for the following components:      Result Value   Creatinine, Ser 1.56 (*)    Calcium 8.6 (*)    Total Protein 6.3 (*)    GFR calc non Af Amer 48 (*)    GFR calc Af Amer 56 (*)    All other components within normal limits  LIPASE, BLOOD - Abnormal; Notable for the following components:   Lipase 55 (*)    All other components within normal limits  CBC  TROPONIN I  HIV ANTIBODY (ROUTINE TESTING W REFLEX)  COMPREHENSIVE METABOLIC PANEL  CBC    EKG EKG Interpretation  Date/Time:  Tuesday May 25 2018 16:43:38 EDT Ventricular Rate:  69 PR Interval:  152 QRS Duration: 76 QT Interval:  384 QTC Calculation: 411 R Axis:   85 Text Interpretation:  Normal sinus rhythm Septal infarct , age undetermined Abnormal ECG No old tracing to compare TWI in lead III Confirmed by Duffy Bruce [phone]) on 05/25/2018 4:46:43 PM   Radiology Dg Chest 2 View  Result Date: 05/25/2018 CLINICAL DATA:  Chest and abdominal pain, cramping, trouble breathing EXAM: CHEST - 2 VIEW COMPARISON:  04/18/2006 FINDINGS: Normal heart size, mediastinal contours, and pulmonary vascularity. Lungs clear. No pleural effusion or pneumothorax. Bones unremarkable. IMPRESSION: No acute abnormalities. Electronically Signed   By:  Lavonia Dana M.D.   On: 05/25/2018 17:26   Ct Abdomen Pelvis W Contrast  Result Date: 05/25/2018 CLINICAL DATA:  Abdominal pain. EXAM: CT ABDOMEN AND PELVIS WITH CONTRAST TECHNIQUE: Multidetector CT imaging of the abdomen and pelvis was performed using the standard protocol following bolus administration of intravenous contrast. CONTRAST:  10mL OMNIPAQUE IOHEXOL 300 MG/ML  SOLN COMPARISON:  02/26/2018 FINDINGS: Lower chest: Emphysema.  No acute findings. Hepatobiliary: There is no focal liver abnormality. The gallbladder appears normal. No biliary dilatation. Pancreas: Unremarkable. No pancreatic ductal dilatation or surrounding inflammatory changes. Spleen: Normal in size without focal abnormality.  Adrenals/Urinary Tract: Normal adrenal glands. The kidneys are unremarkable. No mass or hydronephrosis. Urinary bladder appears normal. Stomach/Bowel: The proximal stomach appears unremarkable. There is mild wall thickening and edema surrounding the proximal duodenum. Small low-attenuation area within the anterolateral wall of the pylorus is identified, best seen on the sagittal images, image 63/7. Suspicious for peptic ulcer, [name], image 26/3 and image 24/3. Suspicious for pneumoperitoneum. No extravasation of enteric contrast material. Musculoskeletal: No acute or significant osseous  findings. There are advanced degenerative changes noted at L5-S1. IMPRESSION: 1. Interval development of free fluid in the right upper quadrant of the abdomen. Additionally, there are 3 tiny foci of free air within the porta hepatic region. Mild edema involving the proximal duodenum noted. Imaging findings are worrisome for peptic ulcer with perforation. No extravasation of enteric contrast material or large volume of free intraperitoneal air identified. 2. Emphysema. 3. These results were called by telephone at the time of interpretation on 05/25/2018 at 7:36 pm to Dr. Duffy Bruce , who verbally acknowledged these results. Electronically Signed   By: Kerby Moors M.D.   On: 05/25/2018 19:36    Procedures .Critical Care Performed by: Duffy Bruce, MD Authorized by: Duffy Bruce, MD   Critical care provider statement:    Critical care time (minutes):  45   Critical care time was exclusive of:  Separately billable procedures and treating other patients and teaching time   Critical care was necessary to treat or prevent imminent or life-threatening deterioration of the following conditions:  Cardiac failure, circulatory failure and sepsis   Critical care was time spent personally by me on the following activities:  Development of treatment plan with patient or surrogate, discussions with consultants, evaluation of patient's response to treatment, examination of patient, obtaining history from patient or surrogate, ordering and performing treatments and interventions, ordering and review of laboratory studies, ordering and review of radiographic studies, pulse oximetry, re-evaluation of patient's condition and review of old charts   I assumed direction of critical care for this patient from another provider in my specialty: no     (including critical care time)  Medications Ordered in ED Medications  sodium chloride flush (NS) 0.9 % injection 3 mL (has no administration in time range)    ondansetron (ZOFRAN) tablet 4 mg (4 mg Oral Refused 05/25/18 1725)  enoxaparin (LOVENOX) injection 40 mg (40 mg Subcutaneous Given 05/25/18 2210)  dextrose 5 %-0.45 % sodium chloride infusion ( Intravenous New Bag/Given 05/25/18 2223)  acetaminophen (TYLENOL) tablet 650 mg (has no administration in time range)    Or  acetaminophen (TYLENOL) suppository 650 mg (has no administration in time range)  morphine 2 MG/ML injection 2 mg (2 mg Intravenous Given 05/25/18 2210)  ondansetron (ZOFRAN-ODT) disintegrating tablet 4 mg (has no administration in time range)    Or  ondansetron (ZOFRAN) injection 4 mg (has no administration in time range)  metoprolol tartrate (LOPRESSOR) injection 5 mg (has no administration in time range)  diphenhydrAMINE (BENADRYL)  12.5 MG/5ML elixir 12.5 mg (has no administration in time range)    Or  diphenhydrAMINE (BENADRYL) injection 12.5 mg (has no administration in time range)  pantoprazole (PROTONIX) injection 40 mg (40 mg Intravenous Given 05/25/18 2210)  piperacillin-tazobactam (ZOSYN) IVPB 3.375 g (has no administration in time range)  HYDROmorphone (DILAUDID) injection 1 mg (1 mg Intravenous Given 05/25/18 1927)  ondansetron (ZOFRAN) injection 4 mg (4 mg Intravenous Given 05/25/18 1929)  sodium chloride 0.9 % bolus 1,000 mL (1,000 mLs Intravenous New Bag/Given 05/25/18 1927)  iohexol (OMNIPAQUE) 300 MG/ML solution 80 mL (80 mLs Intravenous Contrast Given 05/25/18 1905)  piperacillin-tazobactam (ZOSYN) IVPB 3.375 g (0 g Intravenous Stopped 05/25/18 2228)  pantoprazole (PROTONIX) injection 40 mg (40 mg Intravenous Given 05/25/18 2020)  0.9 %  sodium chloride infusion ( Intravenous New Bag/Given 05/25/18 2024)     Initial Impression / Assessment and Plan / ED Course  I have reviewed the triage vital signs and the nursing notes.  Pertinent labs & imaging results that were available during my care of the patient were reviewed by me and considered in my medical decision  making (see chart for details).        58 yo M here with severe epigastric pain. Labs show mild elevation in Lipase but <3 times ULN. No fever, no other infectious sx. CT imaging is concerning for perf duodenal ulcer. Gen Surg consulted, Zosyn started. Will admit to surgery service.  Final Clinical Impressions(s) / ED Diagnoses   Final diagnoses:  Perforated ulcer [facility])    ED Discharge Orders    None       Duffy Bruce, MD 05/25/18 2339

## 2018-05-26 ENCOUNTER — Inpatient Hospital Stay (HOSPITAL_COMMUNITY): Payer: Self-pay

## 2018-05-26 LAB — CBC
HCT: 45.2 % (ref 39.0–52.0)
Hemoglobin: 14.5 g/dL (ref 13.0–17.0)
MCH: 28.9 pg (ref 26.0–34.0)
MCHC: 32.1 g/dL (ref 30.0–36.0)
MCV: 90.2 fL (ref 80.0–100.0)
Platelets: 286 K/uL (ref 150–400)
RBC: 5.01 MIL/uL (ref 4.22–5.81)
RDW: 13.5 % (ref 11.5–15.5)
WBC: 12.7 K/uL — ABNORMAL HIGH (ref 4.0–10.5)
nRBC: 0 % (ref 0.0–0.2)

## 2018-05-26 LAB — COMPREHENSIVE METABOLIC PANEL
ALT: 11 U/L (ref 0–44)
AST: 14 U/L — ABNORMAL LOW (ref 15–41)
Albumin: 2.8 g/dL — ABNORMAL LOW (ref 3.5–5.0)
Alkaline Phosphatase: 48 U/L (ref 38–126)
Anion gap: 8 (ref 5–15)
BUN: 7 mg/dL (ref 6–20)
CHLORIDE: 106 mmol/L (ref 98–111)
CO2: 22 mmol/L (ref 22–32)
Calcium: 7.9 mg/dL — ABNORMAL LOW (ref 8.9–10.3)
Creatinine, Ser: 1.39 mg/dL — ABNORMAL HIGH (ref 0.61–1.24)
GFR calc Af Amer: >60 mL/min (ref >60)
GFR calc non Af Amer: 55 mL/min — ABNORMAL LOW (ref >60)
Glucose, Bld: 128 mg/dL — ABNORMAL HIGH (ref 70–99)
Potassium: 3.7 mmol/L (ref 3.5–5.1)
Sodium: 136 mmol/L (ref 135–145)
Total Bilirubin: 0.9 mg/dL (ref 0.3–1.2)
Total Protein: 5.2 g/dL — ABNORMAL LOW (ref 6.5–8.1)

## 2018-05-26 LAB — MAGNESIUM: Magnesium: 2 mg/dL (ref 1.7–2.4)

## 2018-05-26 LAB — HIV ANTIBODY (ROUTINE TESTING W REFLEX): HIV SCREEN 4TH GENERATION: NONREACTIVE

## 2018-05-26 IMAGING — DX PORTABLE CHEST - 1 VIEW
1 series · 1 of 1 positions shown · non-contrast
Comparison: [DATE]

CLINICAL DATA: Shortness of breath, abdominal pain

EXAM:
PORTABLE CHEST 1 VIEW

[chest ap]
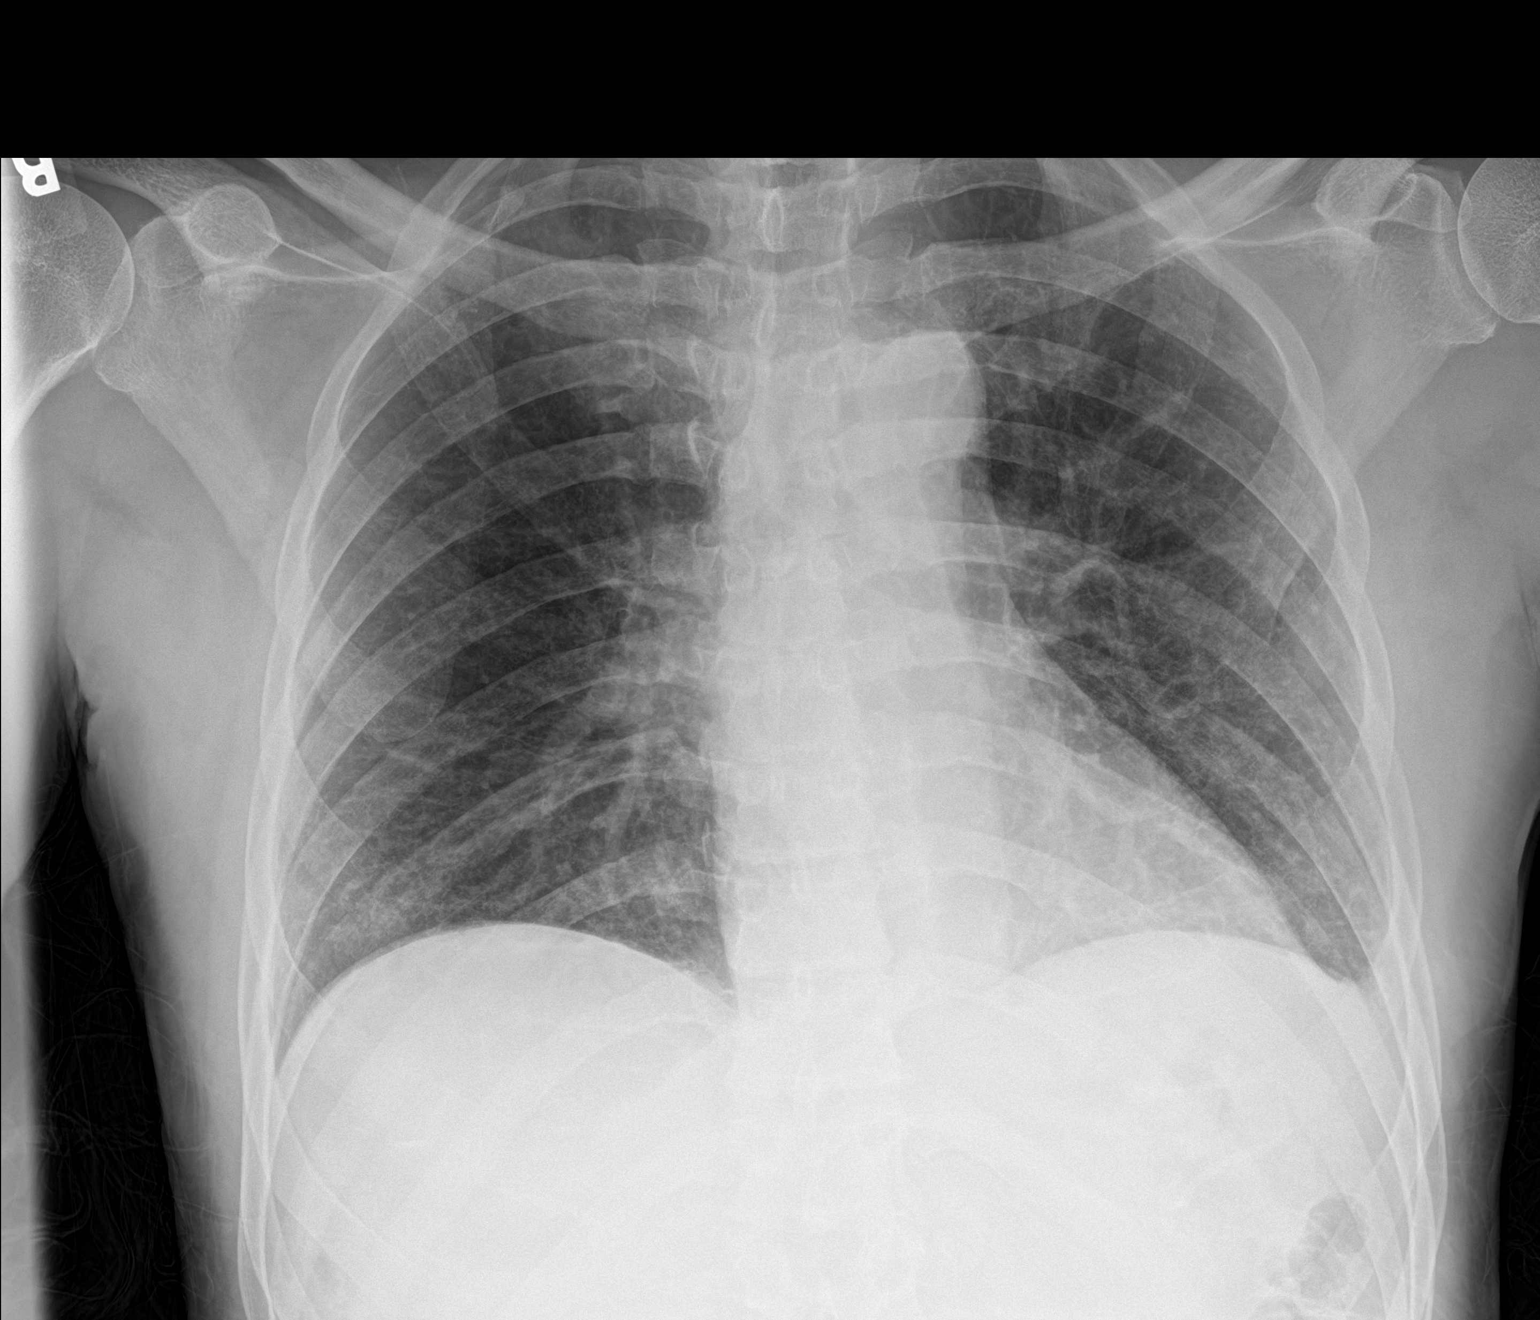

[1 of 1 positions shown; findings below may reference images not displayed]

FINDINGS: Heart and mediastinal contours are within normal limits. No focal
opacities or effusions. No acute bony abnormality.
IMPRESSION: No active disease.

## 2018-05-26 NOTE — TOC Initial Note (Addendum)
Transition of Care [facility]) - Initial/Assessment Note    Patient Details  Name: Donald York MRN: [id number] Date of Birth: [birth year]  Transition of Care [facility]) CM/SW Contact:    Marilu Favre, RN Phone Number: 05/26/2018, 10:15 AM  Clinical Narrative:                  Patient from home . Patient has no insurance. Has been to [facility] [place] [facility] before ( last time about a year ago).  Will continue to follow. Will access  Prescriptions at discharge and eligibility for [facility] and schedule follow up appointment.   Patient may need surgery this admission.    Confirmed face sheet information  Expected Discharge Plan: Home/Self Care Barriers to Discharge: Continued Medical Work up   Patient Goals and CMS Choice Patient states their goals for this hospitalization and ongoing recovery are:: to go home       Expected Discharge Plan and Services Expected Discharge Plan: Home/Self Care In-house Referral: Development worker, community Discharge Planning Services: CM Consult, [facility], [facility] Program Post Acute Care Choice: NA Living arrangements for the past 2 months: Single Family Home                 DME Arranged: N/A DME Agency: NA      Prior Living Arrangements/Services Living arrangements for the past 2 months: Single Family Home Lives with:: Self Patient language and need for interpreter reviewed:: No Do you feel safe going back to the place where you live?: Yes      Need for Family Participation in Patient Care: No (Comment)     Criminal Activity/Legal Involvement Pertinent to Current Situation/Hospitalization: No - Comment as needed  Activities of Daily Living Home Assistive Devices/Equipment: None ADL Screening (condition at time of admission) Patient's cognitive ability adequate to safely complete daily activities?: Yes Is the patient deaf or have difficulty hearing?: No Does the patient have difficulty seeing, even when wearing  glasses/contacts?: No Does the patient have difficulty concentrating, remembering, or making decisions?: No Patient able to express need for assistance with ADLs?: Yes Does the patient have difficulty dressing or bathing?: No Independently performs ADLs?: Yes (appropriate for developmental age) Does the patient have difficulty walking or climbing stairs?: No Weakness of Legs: None Weakness of Arms/Hands: None  Permission Sought/Granted Permission sought to share information with : Case Manager Permission granted to share information with : Yes, Verbal Permission Granted              Emotional Assessment   Attitude/Demeanor/Rapport: Engaged Affect (typically observed): Accepting Orientation: : Oriented to Self, Oriented to Situation, Oriented to  Time, Oriented to Place   Psych Involvement: No (comment)  Admission diagnosis:  Duodenal ulcer [K26.9] Patient Active Problem List   Diagnosis Date Noted  . Duodenal ulcer 05/25/2018  . Perirectal cellulitis 06/09/2016  . AKI (acute kidney injury) ([place]) 06/09/2016  . Tobacco abuse 06/09/2016  . Cocaine use 06/09/2016   PCP:  Azzie Glatter, FNP Pharmacy:   [facility], [place] - [address] [place] [address] Phone: [phone] Fax: [phone]     Social Determinants of Health (SDOH) Interventions    Readmission Risk Interventions No flowsheet data found.

## 2018-05-26 NOTE — Progress Notes (Signed)
Pt. arrived to [place] room 27 from the ED. Had to be slid to the bed from the stretcher. VSS, A&O x4, and complaining of pain 10/10. Will administer PRN morphine and continue to monitor. Oriented to room and to call bell.

## 2018-05-26 NOTE — Plan of Care (Signed)
  Problem: Education: ?Goal: Knowledge of General Education information will improve ?Description: Including pain rating scale, medication(s)/side effects and non-pharmacologic comfort measures ?Outcome: Progressing ?  ?Problem: Health Behavior/Discharge Planning: ?Goal: Ability to manage health-related needs will improve ?Outcome: Progressing ?  ?Problem: Clinical Measurements: ?Goal: Ability to maintain clinical measurements within normal limits will improve ?Outcome: Progressing ?Goal: Will remain free from infection ?Outcome: Progressing ?Goal: Diagnostic test results will improve ?Outcome: Progressing ?Goal: Respiratory complications will improve ?Outcome: Progressing ?  ?Problem: Activity: ?Goal: Risk for activity intolerance will decrease ?Outcome: Progressing ?  ?Problem: Coping: ?Goal: Level of anxiety will decrease ?Outcome: Progressing ?  ?Problem: Elimination: ?Goal: Will not experience complications related to bowel motility ?Outcome: Progressing ?Goal: Will not experience complications related to urinary retention ?Outcome: Progressing ?  ?Problem: Pain Managment: ?Goal: General experience of comfort will improve ?Outcome: Progressing ?  ?Problem: Safety: ?Goal: Ability to remain free from injury will improve ?Outcome: Progressing ?  ?Problem: Skin Integrity: ?Goal: Risk for impaired skin integrity will decrease ?Outcome: Progressing ?  ?

## 2018-05-26 NOTE — Progress Notes (Signed)
CC:  Abdominal pain  Subjective: Patient says he has history of ulcers and has had an upper endoscopy in the past there is no record in the epic.  He is not exactly sure who did it. He had pain like this in the past but never this bad.  He still pretty tender this a.m.  But he does not have acute peritonitis.   Objective: Vital signs in last 24 hours: Temp:  [98.1 F (36.7 C)-99 F (37.2 C)] 98.7 F (37.1 C) (04/01 0510) Pulse Rate:  [74-89] 74 (04/01 0510) Resp:  [16-20] 18 (04/01 0510) BP: (96-154)/(60-69) 96/63 (04/01 0510) SpO2:  [89 %-100 %] 93 % (04/01 0510) Weight:  [81.2 kg] 81.2 kg (03/31 2146) Last BM Date: 05/24/18 Afebrile, VSS CT scan 3/31:  The proximal stomach appears unremarkable. There is mild wall thickening and edema surrounding the proximal duodenum. Small low-attenuation area within the anterolateral wall of the pylorus is identified, best seen on the sagittal images, image 63/7. Suspicious for peptic ulcer, image 62/7. No surrounding free air or extravasation of enteric contrast material. The remaining small bowel loops are unremarkable. The appendix is visualized and appears normal. The colon appears decompressed. WBC up some  Creatinine better Intake/Output from previous day: 03/31 0701 - 04/01 0700 In: -  Out: 200 [Urine:200] Intake/Output this shift: No intake/output data recorded.  General appearance: alert, cooperative and no distress Resp: clear to auscultation bilaterally GI: Still tender mid abdomen.  Does not have a acute peritonitis.  Few bowel sounds  Lab Results:  Recent Labs    05/25/18 1712 05/26/18 0433  WBC 6.3 12.7*  HGB 15.9 14.5  HCT 49.8 45.2  PLT 338 286    BMET Recent Labs    05/25/18 1712 05/26/18 0433  NA 138 136  K 3.9 3.7  CL 103 106  CO2 22 22  GLUCOSE 77 128*  BUN 8 7  CREATININE 1.56* 1.39*  CALCIUM 8.6* 7.9*   PT/INR No results for input(s): LABPROT, INR in the last 72 hours.  Recent Labs   Lab 05/25/18 1712 05/26/18 0433  AST 21 14*  ALT 14 11  ALKPHOS 63 48  BILITOT 0.9 0.9  PROT 6.3* 5.2*  ALBUMIN 3.9 2.8*     Lipase     Component Value Date/Time   LIPASE 55 (H) 05/25/2018 1712     Prior to Admission medications   Medication Sig Start Date End Date Taking? Authorizing Provider  alum & mag hydroxide-simeth (MAALOX/MYLANTA) 200-200-20 MG/5ML suspension Take 30 mLs by mouth every 6 (six) hours as needed for indigestion or heartburn.    [provider]  calcium carbonate (TUMS EX) 750 MG chewable tablet Chew 4 tablets by mouth as needed for heartburn.    [provider]  omeprazole (PRILOSEC) 20 MG capsule Take 1 capsule (20 mg total) by mouth 2 (two) times daily before a meal. 02/26/18   Domenic Moras, PA-C  ranitidine (ZANTAC) 150 MG capsule Take 1 capsule (150 mg total) by mouth 2 (two) times daily. 02/26/18   Domenic Moras, PA-C    Medications: . enoxaparin (LOVENOX) injection  40 mg Subcutaneous Q24H  . ondansetron  4 mg Oral Once  . pantoprazole (PROTONIX) IV  40 mg Intravenous Q12H  . sodium chloride flush  3 mL Intravenous Once   . dextrose 5 % and 0.45% NaCl 75 mL/hr at 05/25/18 2223  . piperacillin-tazobactam (ZOSYN)  IV 3.375 g (05/26/18 0417)    Assessment/Plan Tobacco use NSAID  use Chronic back pain Hx cocaine/marijuana/EtOH use Mild CKD-creatinine 1.46(02/26/18)   1.56 (3/31) >> 1.39(4/1) Leukocytosis 6.3(3/31) >>127 (4/1) Abdominal pain with possible perforated ulcer  FEN:  IV fluids/NPO ID:  Zosyn 3/31>> day 2 DVT: Lovenox Follow up:  TBD  Pain: Keep him n.p.o. continue antibiotics.  Continue IV Protonix.  We plan to reexamine later today decide on the need for surgery.  Recheck labs in a.m.    LOS: 1 day    Jennaya Pogue 05/26/2018 [phone]

## 2018-05-26 NOTE — Progress Notes (Signed)
Pt demanding to eat tonight. Pain is minimal. BS is hypoactive on right quad and active on left side. Denies passing gas.Pt is very irritated. Explained to pt the diseased process. Still demanding to eat. Made Dr. Dema Severin aware. Pt to remain NPO. Will continue to monitor.

## 2018-05-26 NOTE — Plan of Care (Signed)

## 2018-05-27 LAB — COMPREHENSIVE METABOLIC PANEL
ALT: 8 U/L (ref 0–44)
AST: 11 U/L — ABNORMAL LOW (ref 15–41)
Albumin: 2.3 g/dL — ABNORMAL LOW (ref 3.5–5.0)
Alkaline Phosphatase: 44 U/L (ref 38–126)
Anion gap: 5 (ref 5–15)
BUN: 8 mg/dL (ref 6–20)
CO2: 26 mmol/L (ref 22–32)
Calcium: 7.2 mg/dL — ABNORMAL LOW (ref 8.9–10.3)
Chloride: 103 mmol/L (ref 98–111)
Creatinine, Ser: 1.68 mg/dL — ABNORMAL HIGH (ref 0.61–1.24)
GFR calc Af Amer: 51 mL/min — ABNORMAL LOW (ref >60)
GFR calc non Af Amer: 44 mL/min — ABNORMAL LOW (ref >60)
Glucose, Bld: 116 mg/dL — ABNORMAL HIGH (ref 70–99)
Potassium: 3.6 mmol/L (ref 3.5–5.1)
Sodium: 134 mmol/L — ABNORMAL LOW (ref 135–145)
Total Bilirubin: 1 mg/dL (ref 0.3–1.2)
Total Protein: 4.7 g/dL — ABNORMAL LOW (ref 6.5–8.1)

## 2018-05-27 LAB — CBC
HCT: 41.5 % (ref 39.0–52.0)
Hemoglobin: 13.6 g/dL (ref 13.0–17.0)
MCH: 29.9 pg (ref 26.0–34.0)
MCHC: 32.8 g/dL (ref 30.0–36.0)
MCV: 91.2 fL (ref 80.0–100.0)
Platelets: 240 10*3/uL (ref 150–400)
RBC: 4.55 MIL/uL (ref 4.22–5.81)
RDW: 13.6 % (ref 11.5–15.5)
WBC: 11.4 10*3/uL — ABNORMAL HIGH (ref 4.0–10.5)
nRBC: 0 % (ref 0.0–0.2)

## 2018-05-27 LAB — H. PYLORI ANTIBODY, IGG: H Pylori IgG: 1.53 Index Value — ABNORMAL HIGH (ref 0.00–0.79)

## 2018-05-27 MED ORDER — POTASSIUM CHLORIDE 2 MEQ/ML IV SOLN
INTRAVENOUS | Status: DC
  Administered 2018-05-27 – 2018-05-28 (×3): via INTRAVENOUS
  Filled 2018-05-27 (×8): qty 1000

## 2018-05-27 MED ORDER — SODIUM CHLORIDE 0.9 % IV BOLUS
1000.0000 mL | Freq: Once | INTRAVENOUS | Status: AC
  Administered 2018-05-27: 1000 mL via INTRAVENOUS

## 2018-05-27 NOTE — Progress Notes (Signed)
CC: Abdominal pain  Subjective: Patient says he feels better still hurts when he moves around.  His primary complaint is he is hungry he wants something to eat.  I showed him a picture of the GI tract and explained that he probably had a hole in the duodenum, and that we are trying to get that site to heal up without surgery.  On my exam he is a little less tender than he was yesterday, although he says he still hurts when he moves around.  Objective: Vital signs in last 24 hours: Temp:  [98.5 F (36.9 C)-99.8 F (37.7 C)] 99.8 F (37.7 C) (04/02 0522) Pulse Rate:  [66-87] 70 (04/02 0522) Resp:  [16-20] 20 (04/02 0522) BP: (86-108)/(55-75) 91/62 (04/02 0522) SpO2:  [90 %-95 %] 90 % (04/02 0522) Last BM Date: 05/24/18 Urine 875 Nothing else recorded Afebrile, VSS Creatinine is up to 1.68, Na 134 CMP is otherwise stable WBC 11.4    Intake/Output from previous day: 04/01 0701 - 04/02 0700 In: -  Out: 875 [Urine:875] Intake/Output this shift: Total I/O In: -  Out: 150 [Urine:150]  General appearance: alert, cooperative and no distress Resp: clear to auscultation bilaterally GI: Soft, tender than yesterday.  No rebound, he is mildly distended but has bowel sounds.  No BM.  Lab Results:  Recent Labs    05/26/18 0433 05/27/18 0248  WBC 12.7* 11.4*  HGB 14.5 13.6  HCT 45.2 41.5  PLT 286 240    BMET Recent Labs    05/26/18 0433 05/27/18 0248  NA 136 134*  K 3.7 3.6  CL 106 103  CO2 22 26  GLUCOSE 128* 116*  BUN 7 8  CREATININE 1.39* 1.68*  CALCIUM 7.9* 7.2*   PT/INR No results for input(s): LABPROT, INR in the last 72 hours.  Recent Labs  Lab 05/25/18 1712 05/26/18 0433 05/27/18 0248  AST 21 14* 11*  ALT 14 11 8   ALKPHOS 63 48 44  BILITOT 0.9 0.9 1.0  PROT 6.3* 5.2* 4.7*  ALBUMIN 3.9 2.8* 2.3*     Lipase     Component Value Date/Time   LIPASE 55 (H) 05/25/2018 1712     Prior to Admission medications   Medication Sig Start Date End  Date Taking? Authorizing Provider  alum & mag hydroxide-simeth (MAALOX/MYLANTA) 200-200-20 MG/5ML suspension Take 30 mLs by mouth every 6 (six) hours as needed for indigestion or heartburn.   Yes [provider]  calcium carbonate (TUMS EX) 750 MG chewable tablet Chew 4 tablets by mouth as needed for heartburn.   Yes [provider]  naproxen sodium (ALEVE) 220 MG tablet Take 440 mg by mouth daily as needed (knee pain).   Yes [provider]  omeprazole (PRILOSEC) 20 MG capsule Take 1 capsule (20 mg total) by mouth 2 (two) times daily before a meal. 02/26/18   Domenic Moras, PA-C  ranitidine (ZANTAC) 150 MG capsule Take 1 capsule (150 mg total) by mouth 2 (two) times daily. Patient not taking: Reported on 05/26/2018 02/26/18   Domenic Moras, PA-C    Medications: . enoxaparin (LOVENOX) injection  40 mg Subcutaneous Q24H  . ondansetron  4 mg Oral Once  . pantoprazole (PROTONIX) IV  40 mg Intravenous Q12H  . sodium chloride flush  3 mL Intravenous Once   . dextrose 5 % and 0.45% NaCl 125 mL/hr at 05/26/18 1815  . piperacillin-tazobactam (ZOSYN)  IV 3.375 g (05/27/18 0427)     Assessment/Plan Tobacco use NSAID  use Chronic back pain Hx cocaine/marijuana/EtOH use Mild CKD-creatinine 1.46(02/26/18)   1.56 (3/31) >> 1.39(4/1)>>1.68(4/2) Leukocytosis 6.3(3/31) >>12.7 (4/1)>> 11.4 (4/2)  Abdominal pain with possible perforated ulcer  FEN:  IV fluids/NPO ID:  Zosyn 3/31>> day 3 DVT: Lovenox Follow up:  TBD  Plan: Continue conservative management.  H. pylori requested.      LOS: 2 days    Kaitlin Alcindor 05/27/2018 [phone]

## 2018-05-28 LAB — BASIC METABOLIC PANEL
Anion gap: 5 (ref 5–15)
BUN: 5 mg/dL — ABNORMAL LOW (ref 6–20)
CO2: 22 mmol/L (ref 22–32)
Calcium: 7.3 mg/dL — ABNORMAL LOW (ref 8.9–10.3)
Chloride: 109 mmol/L (ref 98–111)
Creatinine, Ser: 1.22 mg/dL (ref 0.61–1.24)
GFR calc Af Amer: >60 mL/min (ref >60)
GFR calc non Af Amer: >60 mL/min (ref >60)
Glucose, Bld: 115 mg/dL — ABNORMAL HIGH (ref 70–99)
Potassium: 3.4 mmol/L — ABNORMAL LOW (ref 3.5–5.1)
Sodium: 136 mmol/L (ref 135–145)

## 2018-05-28 LAB — CBC
HCT: 39.8 % (ref 39.0–52.0)
Hemoglobin: 12.6 g/dL — ABNORMAL LOW (ref 13.0–17.0)
MCH: 28.7 pg (ref 26.0–34.0)
MCHC: 31.7 g/dL (ref 30.0–36.0)
MCV: 90.7 fL (ref 80.0–100.0)
Platelets: 234 10*3/uL (ref 150–400)
RBC: 4.39 MIL/uL (ref 4.22–5.81)
RDW: 13.2 % (ref 11.5–15.5)
WBC: 9.2 10*3/uL (ref 4.0–10.5)
nRBC: 0 % (ref 0.0–0.2)

## 2018-05-28 LAB — H. PYLORI ANTIBODY, IGG: H Pylori IgG: 1.45 Index Value — ABNORMAL HIGH (ref 0.00–0.79)

## 2018-05-28 MED ORDER — AMOXICILLIN 500 MG PO CAPS
1000.0000 mg | ORAL_CAPSULE | Freq: Two times a day (BID) | ORAL | Status: DC
  Administered 2018-05-29: 08:00:00 1000 mg via ORAL
  Filled 2018-05-28: qty 2

## 2018-05-28 MED ORDER — PANTOPRAZOLE SODIUM 40 MG PO TBEC: 40.0000 mg | DELAYED_RELEASE_TABLET | Freq: Two times a day (BID) | ORAL | 0 refills | Status: DC

## 2018-05-28 MED ORDER — PANTOPRAZOLE SODIUM 40 MG PO TBEC
40.0000 mg | DELAYED_RELEASE_TABLET | Freq: Two times a day (BID) | ORAL | Status: DC
  Administered 2018-05-29: 08:00:00 40 mg via ORAL
  Filled 2018-05-28: qty 1

## 2018-05-28 MED ORDER — CLARITHROMYCIN 500 MG PO TABS: 500.0000 mg | ORAL_TABLET | Freq: Two times a day (BID) | ORAL | 0 refills | Status: AC

## 2018-05-28 MED ORDER — CLARITHROMYCIN 500 MG PO TABS
500.0000 mg | ORAL_TABLET | Freq: Two times a day (BID) | ORAL | Status: DC
  Administered 2018-05-29: 500 mg via ORAL
  Filled 2018-05-28: qty 1

## 2018-05-28 MED ORDER — POTASSIUM CHLORIDE CRYS ER 10 MEQ PO TBCR
10.0000 meq | EXTENDED_RELEASE_TABLET | Freq: Three times a day (TID) | ORAL | Status: AC
  Administered 2018-05-28 (×3): 10 meq via ORAL
  Filled 2018-05-28 (×3): qty 1

## 2018-05-28 MED ORDER — AMOXICILLIN 500 MG PO CAPS: 1000.0000 mg | ORAL_CAPSULE | Freq: Two times a day (BID) | ORAL | 0 refills | Status: AC

## 2018-05-28 MED FILL — CLARITHROMYCIN 500 MG TAB: 500 | 14 days supply | Qty: 28 | Fill #0

## 2018-05-28 MED FILL — PANTOPRAZOLE SOD DR 40 MG T: 40 | 30 days supply | Qty: 60 | Fill #0

## 2018-05-28 MED FILL — AMOXICILLIN 500 MG CAPS: 500 | 14 days supply | Qty: 56 | Fill #0

## 2018-05-28 NOTE — TOC Progression Note (Addendum)
Transition of Care [facility] - Progression Note    Patient Details  Name: Donald York MRN: [id number] Date of Birth: 1960-09-19  [facility]) CM/SW Contact  Jacalyn Lefevre Edson Snowball, RN Phone Number: 05/28/2018, 9:46 AM  Clinical Narrative:    1300 Entered patient into [facility] program with co pay over rides for Protonix, Biaxin, and Amoxicillin   Follow up [facility] at [facility] on June 11, 2018 at 1120.  Spoke with PA , plan to discharge patient next week on PO ABX and PPI. Will enter patient in [facility] and co pay over ride. Asked Will with [facility] to send scripts to [place].  Expected Discharge Plan: Home/Self Care Barriers to Discharge: Continued Medical Work up  Expected Discharge Plan and Services Expected Discharge Plan: Home/Self Care In-house Referral: Development worker, community Discharge Planning Services: CM Consult, [facility], [facility] Program Post Acute Care Choice: NA Living arrangements for the past 2 months: Single Family Home                 DME Arranged: N/A DME Agency: NA       Social Determinants of Health (SDOH) Interventions    Readmission Risk Interventions No flowsheet data found.

## 2018-05-28 NOTE — Progress Notes (Signed)
    CC: Abdominal pain  Subjective: Patient says he is feeling much better still has some mid epigastric pain.  He says it is much better and  he is ready to go home.  He also still asking about foods and is ready for real food.  Objective: Vital signs in last 24 hours: Temp:  [98.6 F (37 C)-98.9 F (37.2 C)] 98.9 F (37.2 C) (04/03 0540) Pulse Rate:  [66-79] 79 (04/03 0540) Resp:  [18] 18 (04/03 0540) BP: (99-122)/(67-81) 122/70 (04/03 0540) SpO2:  [93 %-95 %] 95 % (04/03 0540) Last BM Date: 05/24/18 NPO 4539 IV 1425 urine No BM recorded Afebrile, VSS Labs show K+ down some, WBC down to 9.2 Intake/Output from previous day: 04/02 0701 - 04/03 0700 In: 4539.1 [I.V.:4303.5; IV Piggyback:235.6] Out: 1425 [Urine:1425] Intake/Output this shift: No intake/output data recorded.  General appearance: alert, cooperative and no distress Resp: clear to auscultation bilaterally GI: Soft, still some discomfort in the mid abdomen, positive bowel sounds, no BM so far.  Lab Results:  Recent Labs    05/27/18 0248 05/28/18 0209  WBC 11.4* 9.2  HGB 13.6 12.6*  HCT 41.5 39.8  PLT 240 234    BMET Recent Labs    05/27/18 0248 05/28/18 0209  NA 134* 136  K 3.6 3.4*  CL 103 109  CO2 26 22  GLUCOSE 116* 115*  BUN 8 5*  CREATININE 1.68* 1.22  CALCIUM 7.2* 7.3*   PT/INR No results for input(s): LABPROT, INR in the last 72 hours.  Recent Labs  Lab 05/25/18 1712 05/26/18 0433 05/27/18 0248  AST 21 14* 11*  ALT 14 11 8   ALKPHOS 63 48 44  BILITOT 0.9 0.9 1.0  PROT 6.3* 5.2* 4.7*  ALBUMIN 3.9 2.8* 2.3*     Lipase     Component Value Date/Time   LIPASE 55 (H) 05/25/2018 1712     Medications: . enoxaparin (LOVENOX) injection  40 mg Subcutaneous Q24H  . ondansetron  4 mg Oral Once  . pantoprazole (PROTONIX) IV  40 mg Intravenous Q12H  . sodium chloride flush  3 mL Intravenous Once    Assessment/Plan  Tobacco use NSAID use Chronic back pain Hx  cocaine/marijuana/EtOH use Mild CKD-creatinine1.46(02/26/18)1.56 (3/31) >>1.39(4/1)>>1.68(4/2) Leukocytosis 6.3(3/31) >>12.7 (4/1)>> 11.4 (4/2)  Abdominal pain with possible perforated ulcer  - H pylori positive - 1.53 (>0.89 - positive)  FEN: IV fluids/NPO ID: Zosyn 3/31>>day 4 DVT: Lovenox Follow up: TBD  Plan: We will start him on clear liquids today.  Continue IV Zosyn and Protonix. If he does well with clears will discuss changing over to Clarithromycin and amoxicillin tomorrow for 14 days, continue PPI.  Will need follow up with GI in about 6-8 weeks.  I have ordered labs to be repeated on 05/30/18.  I have ask case management to see and evaluate his ability to pay for medicines after discharge.   PCP:  Azzie Glatter, FNP   Earnstine Regal 05/28/2018 [phone]

## 2018-05-28 NOTE — Plan of Care (Signed)
  Problem: Safety: Goal: Ability to remain free from injury will improve Outcome: Progressing   Problem: Skin Integrity: Goal: Risk for impaired skin integrity will decrease Outcome: Progressing   

## 2018-05-28 NOTE — Progress Notes (Signed)
I talked with case Manager and I have ordered 14 days of Biaxin/Amoxicillin, 30 days of Protonix and sent to the [facility] pharmacy.  When he goes home it should be available for him.

## 2018-05-29 LAB — CBC
HCT: 37.7 % — ABNORMAL LOW (ref 39.0–52.0)
Hemoglobin: 12.6 g/dL — ABNORMAL LOW (ref 13.0–17.0)
MCH: 30.2 pg (ref 26.0–34.0)
MCHC: 33.4 g/dL (ref 30.0–36.0)
MCV: 90.4 fL (ref 80.0–100.0)
Platelets: 275 10*3/uL (ref 150–400)
RBC: 4.17 MIL/uL — ABNORMAL LOW (ref 4.22–5.81)
RDW: 13.1 % (ref 11.5–15.5)
WBC: 8.8 10*3/uL (ref 4.0–10.5)
nRBC: 0 % (ref 0.0–0.2)

## 2018-05-29 NOTE — Care Management (Signed)
Prescriptions are in [place] and will be sent up to patient's room prior to d/c.  RN aware.

## 2018-05-29 NOTE — Plan of Care (Cosign Needed)
°  Problem: Elimination: °Goal: Will not experience complications related to bowel motility °Outcome: Progressing °  °Problem: Pain Managment: °Goal: General experience of comfort will improve °Outcome: Progressing °  °

## 2018-05-29 NOTE — Discharge Summary (Signed)
Patient ID: Donald York [id number] [birth year] 58 y.o.  Admit date: 05/25/2018 Discharge date: 05/29/2018  Admitting Diagnosis: Contained perforated duodenal ulcer  Discharge Diagnosis Patient Active Problem List   Diagnosis Date Noted  . Duodenal ulcer 05/25/2018  . Perirectal cellulitis 06/09/2016  . AKI (acute kidney injury) ([place]) 06/09/2016  . Tobacco abuse 06/09/2016  . Cocaine use 06/09/2016    Consultants none  Reason for Admission: 58 yo male with 1 day of epigastric abdominal pain. Pain is in his epigastrium. HE has never had a pain like this. He has nausea and vomiting and dry mouth. He smokes 1 ppd. He drinks 1-2 40oz malt liquor a week. He had ulcers 30 years ago. He recently filled a prescription for antacid due to reflux symptoms.  Procedures none  Hospital Course:   The patient was admitted and placed on IV PPI therapy as his abdominal exam did not reveal peritonitis.  He was also started on zosyn and kept NPO.  On HD 1 the patient still had some pain, but apparently improved throughout the day and became irritable and demanding to eat.  He remained NPO still as his WBC was still elevated albeit improving.  On HD 3 he was feeling much better and his WBC normalized.  He did test positive for h. Pylori and was started on appropriate treatment for this.  He was placed on clear liquids which he tolerated well.  His diet was advanced to a soft diet the following day.  He was stable for DC home at this time.    Physical Exam: Gen: NAD Heart: regular Lungs: CTAB Abd: soft, NT, ND, +BS  Allergies as of 05/29/2018   No Known Allergies     Medication List    STOP taking these medications   naproxen sodium 220 MG tablet Commonly known as:  ALEVE   omeprazole 20 MG capsule Commonly known as:  PRILOSEC   ranitidine 150 MG capsule Commonly known as:  ZANTAC     TAKE these medications   alum & mag hydroxide-simeth 200-200-20 MG/5ML suspension Commonly known  as:  MAALOX/MYLANTA Take 30 mLs by mouth every 6 (six) hours as needed for indigestion or heartburn.   amoxicillin 500 MG capsule Commonly known as:  AMOXIL Take 2 capsules (1,000 mg total) by mouth every 12 (twelve) hours for 14 days.   calcium carbonate 750 MG chewable tablet Commonly known as:  TUMS EX Chew 4 tablets by mouth as needed for heartburn.   clarithromycin 500 MG tablet Commonly known as:  BIAXIN Take 1 tablet (500 mg total) by mouth every 12 (twelve) hours for 14 days.   pantoprazole 40 MG tablet Commonly known as:  PROTONIX Take 1 tablet (40 mg total) by mouth 2 (two) times daily for 30 days.        Follow-up [facility] Follow up.   Specialty:  Internal Medicine Why:  Friday June 11, 2018 at [place] information: [place] [id number] [place]       Coralie Keens, MD Follow up on 06/14/2018.   Specialty:  General Surgery Why:  Your appointment is at 10:40 AM.  Be at the office 30 minutes early for check in.  Bring photo ID and insurance information with you. Contact information: [place] [address] [phone]        Azzie Glatter, FNP Follow up.  Specialty:  Family Medicine Why:  Call and get a follow up appointment.  You will need a gastroenterology evaluation and endoscopy in about 6 weeks.  They should be able to help you set this up.   Contact information: [place] [address] [phone]           Signed: Saverio Danker, [facility] 05/29/2018, 8:41 AM Pager: [phone]

## 2018-06-11 ENCOUNTER — Ambulatory Visit: Payer: Self-pay | Admitting: Family Medicine

## 2018-10-05 ENCOUNTER — Encounter (HOSPITAL_COMMUNITY): Payer: Self-pay

## 2018-10-05 ENCOUNTER — Other Ambulatory Visit: Payer: Self-pay

## 2018-10-05 ENCOUNTER — Emergency Department (HOSPITAL_COMMUNITY): Payer: Self-pay

## 2018-10-05 ENCOUNTER — Emergency Department (HOSPITAL_COMMUNITY)
Admission: EM | Admit: 2018-10-05 | Discharge: 2018-10-05 | Disposition: A | Discharge status: Home / Self Care | Payer: Self-pay | Attending: Emergency Medicine | Admitting: Emergency Medicine

## 2018-10-05 DIAGNOSIS — F17210 Nicotine dependence, cigarettes, uncomplicated: Secondary | ICD-10-CM | POA: Insufficient documentation

## 2018-10-05 DIAGNOSIS — F1210 Cannabis abuse, uncomplicated: Secondary | ICD-10-CM | POA: Insufficient documentation

## 2018-10-05 DIAGNOSIS — F1410 Cocaine abuse, uncomplicated: Secondary | ICD-10-CM | POA: Insufficient documentation

## 2018-10-05 DIAGNOSIS — R369 Urethral discharge, unspecified: Secondary | ICD-10-CM | POA: Insufficient documentation

## 2018-10-05 DIAGNOSIS — N50812 Left testicular pain: Secondary | ICD-10-CM | POA: Insufficient documentation

## 2018-10-05 DIAGNOSIS — N451 Epididymitis: Secondary | ICD-10-CM | POA: Insufficient documentation

## 2018-10-05 LAB — URINALYSIS, ROUTINE W REFLEX MICROSCOPIC
Bacteria, UA: NONE SEEN
Bilirubin Urine: NEGATIVE
Glucose, UA: NEGATIVE mg/dL
Hgb urine dipstick: NEGATIVE
Ketones, ur: NEGATIVE mg/dL
Nitrite: NEGATIVE
Protein, ur: NEGATIVE mg/dL
Specific Gravity, Urine: 1.013 (ref 1.005–1.030)
WBC, UA: >50 WBC/hpf — ABNORMAL HIGH (ref 0–5)
pH: 6 (ref 5.0–8.0)

## 2018-10-05 IMAGING — US ULTRASOUND SCROTUM DOPPLER COMPLETE
1 series · 14 of 25 positions shown · non-contrast
Comparison: CT [DATE].

CLINICAL DATA: Left scrotal pain for 5 days.

EXAM:
SCROTAL ULTRASOUND
DOPPLER ULTRASOUND OF THE TESTICLES
TECHNIQUE: Complete ultrasound examination of the testicles, epididymis, and
other scrotal structures was performed. Color and spectral Doppler
ultrasound were also utilized to evaluate blood flow to the
testicles.

[Series 1: ultrasound scrotum doppler complete · 14 of 60 slices shown]
[im 1/60]
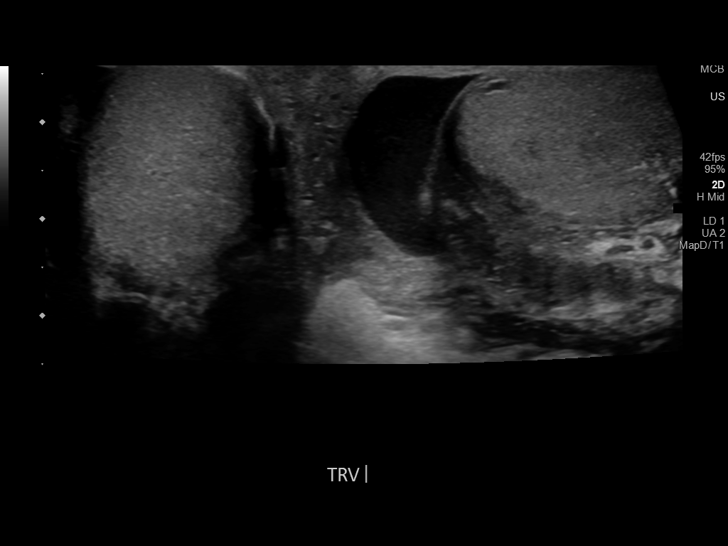
[im 5/60]
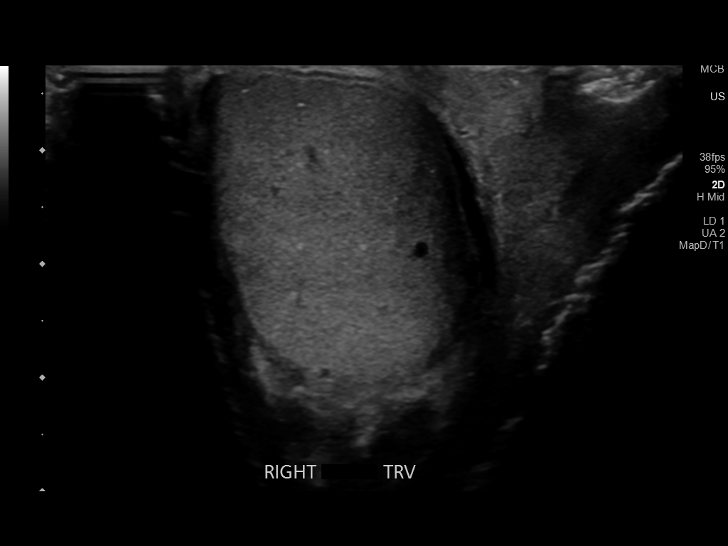
[im 10/60]
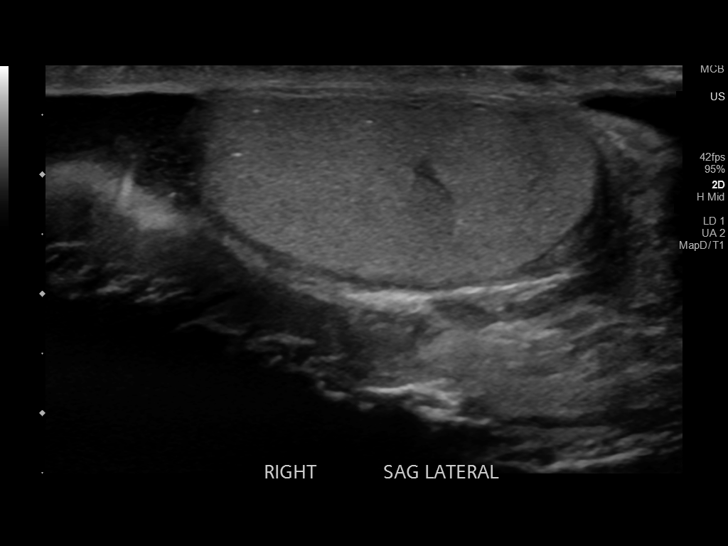
[im 15/60]
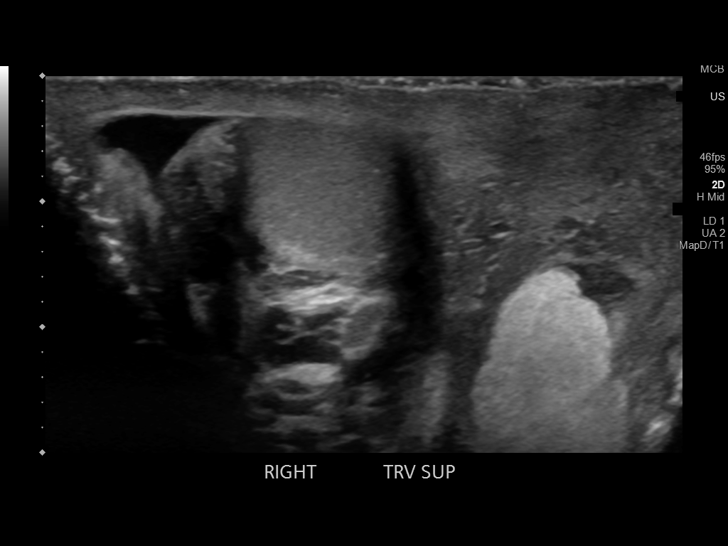
[im 20/60]
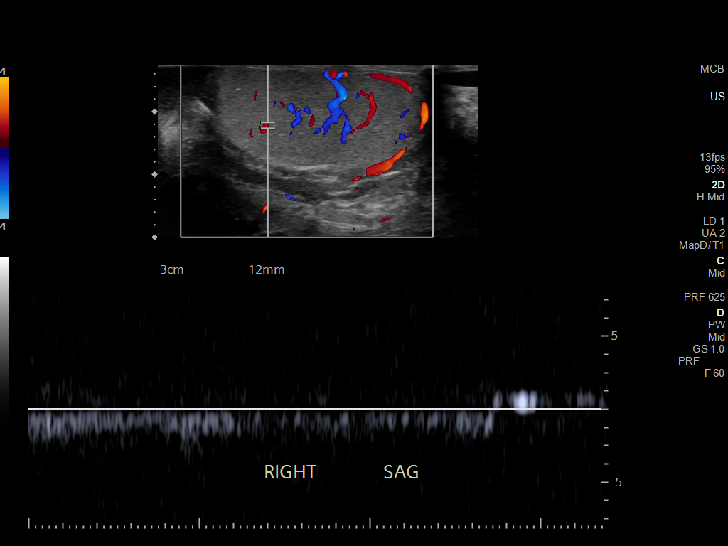
[im 23/60]
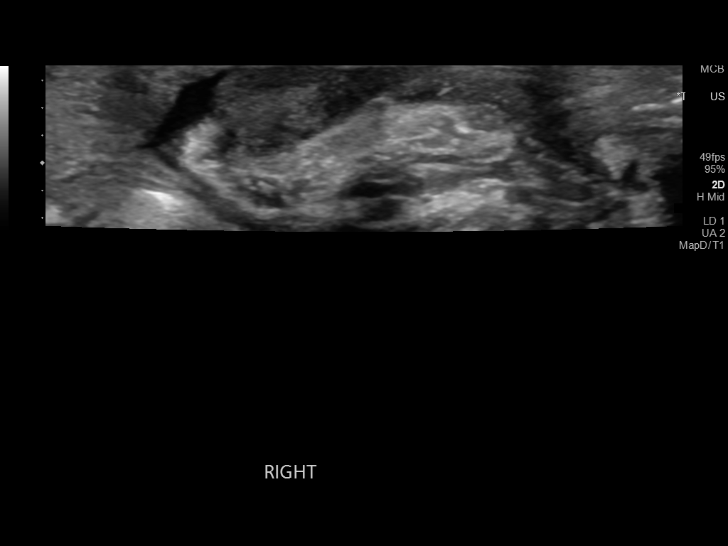
[im 28/60]
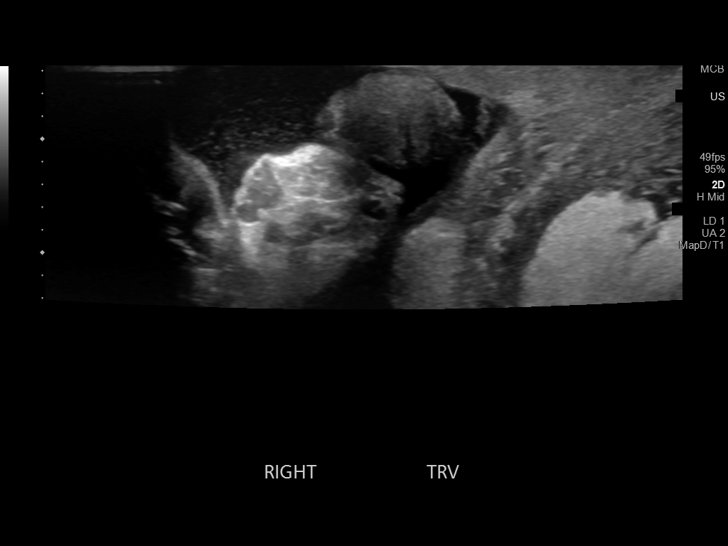
[im 32/60]
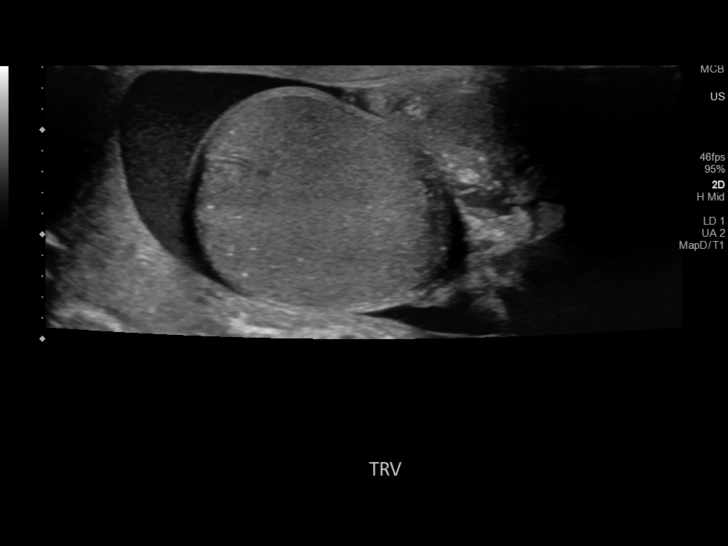
[im 37/60]
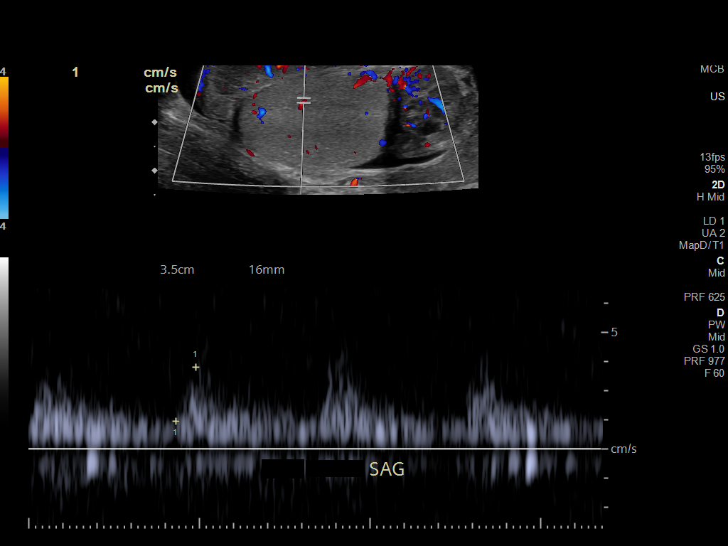
[im 40/60]
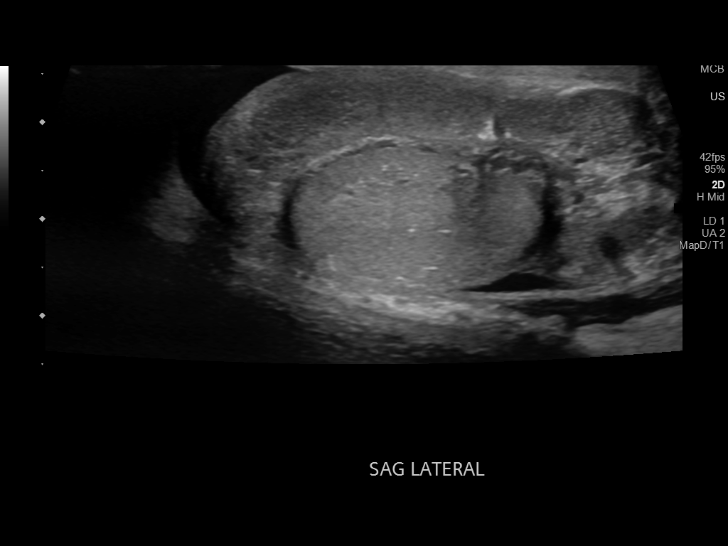
[im 45/60]
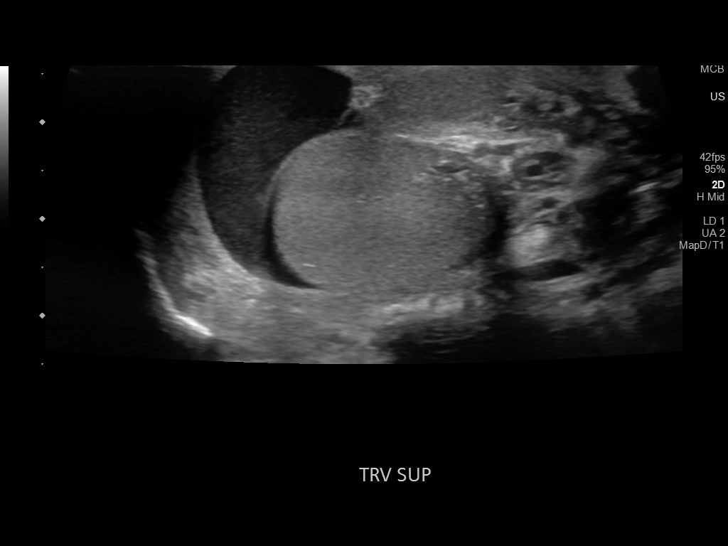
[im 50/60]
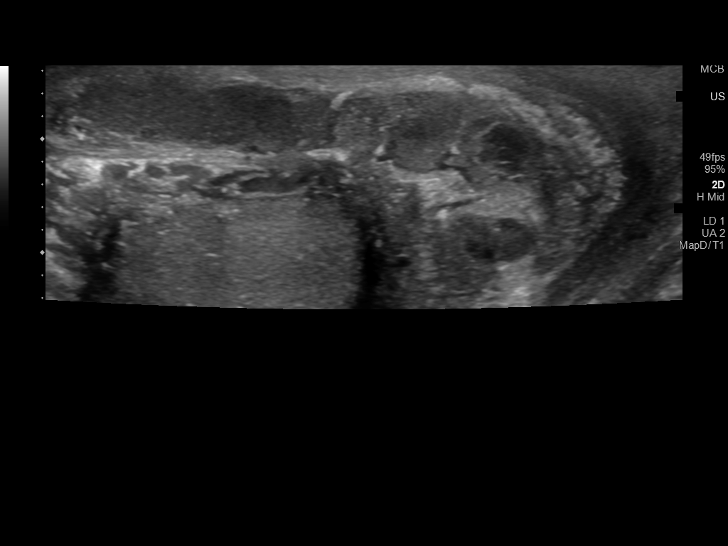
[im 55/60]
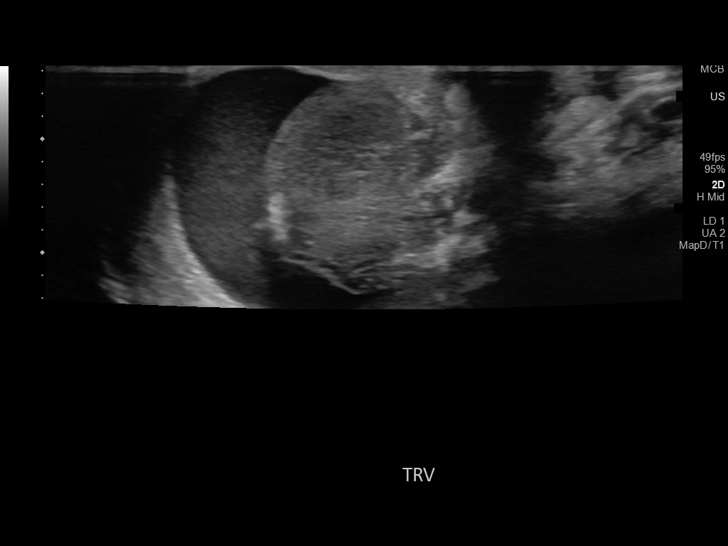
[im 60/60]
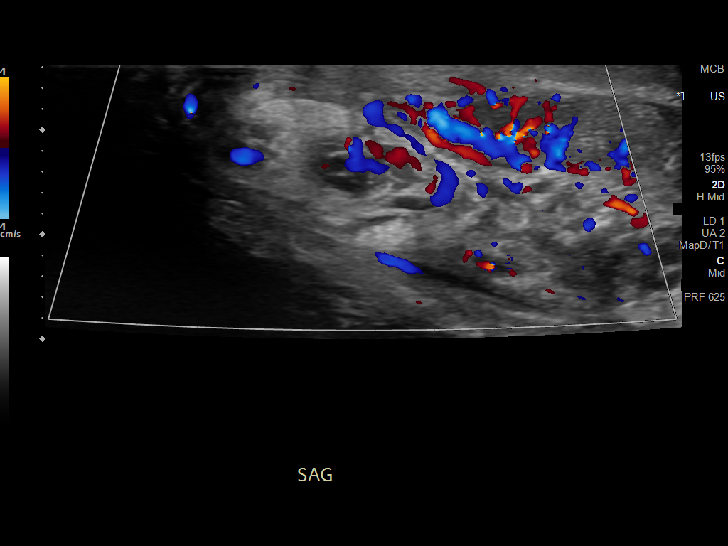

[14 of 25 positions shown; findings below may reference images not displayed]

FINDINGS: Right testicle

Measurements: 4.1 x 1.8 x 2.2 cm.  No mass.  Mild microlithiasis.

Left testicle

.  Mild microlithiasis.  Or microlithiasis visualized.

Right epididymis:  Normal in size and appearance.

Left epididymis: Left epididymis is prominent and hypervascular
suggesting epididymitis.

Hydrocele:  Small right hydrocele.  Small moderate left hydrocele.

Varicocele:  None visualized.

Pulsed Doppler interrogation of both testes demonstrates normal low
resistance arterial and venous waveforms bilaterally.
IMPRESSION: 1. Left epididymis is prominent and hypervascular suggesting
epididymitis. Small to moderate left hydrocele.

2.  Small right hydrocele.

## 2018-10-05 MED ORDER — DOXYCYCLINE HYCLATE 100 MG PO CAPS: 100.0000 mg | ORAL_CAPSULE | Freq: Two times a day (BID) | ORAL | 0 refills | Status: DC

## 2018-10-05 MED ORDER — CEFTRIAXONE SODIUM 250 MG IJ SOLR
250.0000 mg | Freq: Once | INTRAMUSCULAR | Status: AC
  Administered 2018-10-05: 250 mg via INTRAMUSCULAR
  Filled 2018-10-05: qty 250

## 2018-10-05 MED ORDER — AZITHROMYCIN 250 MG PO TABS
1000.0000 mg | ORAL_TABLET | Freq: Once | ORAL | Status: AC
  Administered 2018-10-05: 1000 mg via ORAL
  Filled 2018-10-05: qty 4

## 2018-10-05 MED ORDER — LIDOCAINE HCL 1 % IJ SOLN
INTRAMUSCULAR | Status: AC
  Filled 2018-10-05: qty 20
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
## 2018-10-05 NOTE — ED Provider Notes (Signed)
[facility] DEPT Provider Note   CSN: [id number] Arrival date & time: 10/05/18  6389    History   Chief Complaint Chief Complaint  Patient presents with  . Testicle Pain    HPI Donald York is a 58 y.o. male presenting for evaluation of difficulty urinating, penile discharge, and left-sided testicular swelling.  Patient states several weeks ago he started having intercourse with a new partner.  They were using condoms, but he states 1 of them broke.  They were playing around at one point when she accidentally kneed him in the groin.  Since then, he has had intermittent difficulty with urination, feeling like he has to strain for urine to come out and feeling like he does not completely empty.  He notes several weeks of penile discharge.  Several days ago, he started to have swelling of his left testicle, which has also become painful.  He denies fevers, chills, nausea, vomiting, abdominal pain, abnormal bowel movements.  History of peripheral ulcer requiring hospitalization several months ago.  He has not followed up with GI, is not currently taking anything for this.  Also reports a history of sciatica for which he is dissolving NSAIDs and water and drinking. He does not have a pcp.   Additional history obtained from chart review, patient with a history of ulcers, perirectal cellulitis, AKI, tobacco use, cocaine use.     HPI  Past Medical History:  Diagnosis Date  . Family history of adverse reaction to anesthesia    " [name] "  . Peri-rectal abscess 06/09/2016    Patient Active Problem List   Diagnosis Date Noted  . Duodenal ulcer 05/25/2018  . Perirectal cellulitis 06/09/2016  . AKI (acute kidney injury) ([place]) 06/09/2016  . Tobacco abuse 06/09/2016  . Cocaine use 06/09/2016    Past Surgical History:  Procedure Laterality Date  . NO PAST SURGERIES          Home Medications    Prior to Admission medications   Medication Sig Start  Date End Date Taking? Authorizing Provider  doxycycline (VIBRAMYCIN) 100 MG capsule Take 1 capsule (100 mg total) by mouth 2 (two) times daily. 10/05/18   Timiya Howells, PA-C  pantoprazole (PROTONIX) 40 MG tablet Take 1 tablet (40 mg total) by mouth 2 (two) times daily for 30 days. Patient not taking: Reported on 10/05/2018 05/29/18 06/28/18  Earnstine Regal, PA-C    Family History Family History  Problem Relation Age of Onset  . Cancer Maternal Uncle     Social History Social History   Tobacco Use  . Smoking status: Current Some Day Smoker    Packs/day: 0.50    Types: Cigarettes  . Smokeless tobacco: Never Used  Substance Use Topics  . Alcohol use: Yes    Comment: 40 oz beer, every now and then  . Drug use: Yes    Types: Cocaine, Marijuana     Allergies   Patient has no known allergies.   Review of Systems Review of Systems  Genitourinary: Positive for difficulty urinating, discharge, scrotal swelling and testicular pain.  All other systems reviewed and are negative.    Physical Exam Updated Vital Signs BP 90/63   Pulse 79   Resp 16   Ht 6' (1.829 m)   Wt 74.4 kg   SpO2 100%   BMI 22.24 kg/m   Physical Exam Vitals signs and nursing note reviewed. Exam conducted with a chaperone present.  Constitutional:      General:  He is not in acute distress.    Appearance: He is well-developed.     Comments: Appears nontoxic  HENT:     Head: Normocephalic and atraumatic.  Eyes:     Conjunctiva/sclera: Conjunctivae normal.     Pupils: Pupils are equal, round, and reactive to light.  Neck:     Musculoskeletal: Normal range of motion and neck supple.  Cardiovascular:     Rate and Rhythm: Normal rate and regular rhythm.  Pulmonary:     Effort: Pulmonary effort is normal. No respiratory distress.     Breath sounds: Normal breath sounds. No wheezing.  Abdominal:     General: There is no distension.     Palpations: Abdomen is soft. There is no mass.     Tenderness:  There is abdominal tenderness. There is no guarding or rebound.     Hernia: There is no hernia in the left inguinal area or right inguinal area.     Comments: Tenderness palpation of suprapubic abdomen.  No tenderness palpation elsewhere in the abdomen.  Genitourinary:    Penis: Circumcised. Discharge present.      Scrotum/Testes:        Left: Tenderness and swelling present.       Comments: Thin clear/whitish discharge around the glans of the penis.  Swelling, tenderness, and induration of the left testicle. No erythema.  No inguinal LAD Musculoskeletal: Normal range of motion.  Lymphadenopathy:     Lower Body: No right inguinal adenopathy. No left inguinal adenopathy.  Skin:    General: Skin is warm and dry.  Neurological:     Mental Status: He is alert and oriented to person, place, and time.      ED Treatments / Results  Labs (all labs ordered are listed, but only abnormal results are displayed) Labs Reviewed  URINALYSIS, ROUTINE W REFLEX MICROSCOPIC - Abnormal; Notable for the following components:      Result Value   Leukocytes,Ua MODERATE (*)    WBC, UA >50 (*)    All other components within normal limits  GC/CHLAMYDIA PROBE AMP ([place]) NOT AT [facility]    EKG None  Radiology US Scrotum W/doppler  Result Date: 10/05/2018 CLINICAL DATA:  Left scrotal pain for 5 days. EXAM: SCROTAL ULTRASOUND DOPPLER ULTRASOUND OF THE TESTICLES TECHNIQUE: Complete ultrasound examination of the testicles, epididymis, and other scrotal structures was performed. Color and spectral Doppler ultrasound were also utilized to evaluate blood flow to the testicles. COMPARISON:  CT 02/26/2018. FINDINGS: Right testicle Measurements: 4.1 x 1.8 x 2.2 cm.  No mass.  Mild microlithiasis. Left testicle .  Mild microlithiasis.  Or microlithiasis visualized. Right epididymis:  Normal in size and appearance. Left epididymis: Left epididymis is prominent and hypervascular suggesting epididymitis. Hydrocele:   Small right hydrocele.  Small moderate left hydrocele. Varicocele:  None visualized. Pulsed Doppler interrogation of both testes demonstrates normal low resistance arterial and venous waveforms bilaterally. IMPRESSION: 1. Left epididymis is prominent and hypervascular suggesting epididymitis. Small to moderate left hydrocele. 2.  Small right hydrocele. Electronically Signed   By: Marcello Moores  Register   On: 10/05/2018 10:58    Procedures Procedures (including critical care time)  Medications Ordered in ED Medications  lidocaine (XYLOCAINE) 1 % (with pres) injection (has no administration in time range)  cefTRIAXone (ROCEPHIN) injection 250 mg (250 mg Intramuscular Given 10/05/18 1004)  azithromycin (ZITHROMAX) tablet 1,000 mg (1,000 mg Oral Given 10/05/18 1004)     Initial Impression / Assessment and Plan / ED Course  I have reviewed the triage vital signs and the nursing notes.  Pertinent labs & imaging results that were available during my care of the patient were reviewed by me and considered in my medical decision making (see chart for details).        Pt presenting for evaluation of urinary sxs, testicular swelling and penile d/c. Physical exam concerning for possible infection. As groin injury happened several weeks ago, doubt swelling due to injury, favor infection.  Will test for gonorrhea and chlamydia.  Patient refusing blood work/IV.  Will hold on HIV and syphilis testing.  Urine ordered due to urinary symptoms.  Considering high suspicion for infection, will treat for gonorrhea and chlamydia.  Will obtain ultrasound due to testicular swelling and pain.  Urine with moderate leuks and greater than 50 white cells, likely due to STI. As urinary difficulty is intermittent, doubt UTI. [place] pending.   [place] consistent with epididymitis. Will tx with doxy. F/u with urology. Discussed results for gc/cl are pending. At this time, pt appears safe for d/c. Return precautions given. Pt states he  understands and agrees to plan.    Final Clinical Impressions(s) / ED Diagnoses   Final diagnoses:  Epididymitis    ED Discharge Orders         Ordered    doxycycline (VIBRAMYCIN) 100 MG capsule  2 times daily     10/05/18 1205           Welda Azzarello, PA-C 10/05/18 1250    Milton Ferguson, MD 10/06/18 [phone]

## 2018-10-05 NOTE — ED Notes (Signed)
US in room 

## 2018-10-05 NOTE — Discharge Instructions (Addendum)
Take doxycycline as prescribed.  Take the entire course, even if your symptoms improve. Follow-up with the urologist listed below if your swelling or symptoms are not improving. Your results for gonorrhea and chlamydia are pending.  If positive, you will receive a phone call in several days.  You have already been treated, so you do not need further treatment.  You will need to inform your partners that you tested positive.  If negative, you will not receive a phone call.  Either way, you may check online on MyChart. Abstain from sex for 2 weeks to prevent reinfection.  Follow-up with either [facility] or primary care [facility] to establish primary care.  This is very important. Return to the emergency room with any new, worsening, concerning symptoms.

## 2018-10-06 LAB — GC/CHLAMYDIA PROBE AMP (~~LOC~~) NOT AT ARMC
Chlamydia: NEGATIVE
Neisseria Gonorrhea: POSITIVE — AB

## 2018-11-25 DIAGNOSIS — R42 Dizziness and giddiness: Secondary | ICD-10-CM

## 2018-11-25 DIAGNOSIS — R2990 Unspecified symptoms and signs involving the nervous system: Secondary | ICD-10-CM

## 2018-11-25 HISTORY — DX: Unspecified symptoms and signs involving the nervous system: R29.90

## 2018-11-25 HISTORY — DX: Dizziness and giddiness: R42

## 2018-12-20 ENCOUNTER — Emergency Department (HOSPITAL_COMMUNITY)
Admission: EM | Admit: 2018-12-20 | Discharge: 2018-12-21 | Disposition: A | Discharge status: Home / Self Care | Payer: Self-pay | Attending: Emergency Medicine | Admitting: Emergency Medicine

## 2018-12-20 ENCOUNTER — Emergency Department (HOSPITAL_COMMUNITY): Payer: Self-pay

## 2018-12-20 DIAGNOSIS — F1210 Cannabis abuse, uncomplicated: Secondary | ICD-10-CM | POA: Insufficient documentation

## 2018-12-20 DIAGNOSIS — F1410 Cocaine abuse, uncomplicated: Secondary | ICD-10-CM | POA: Insufficient documentation

## 2018-12-20 DIAGNOSIS — R2990 Unspecified symptoms and signs involving the nervous system: Secondary | ICD-10-CM | POA: Insufficient documentation

## 2018-12-20 DIAGNOSIS — F1910 Other psychoactive substance abuse, uncomplicated: Secondary | ICD-10-CM | POA: Insufficient documentation

## 2018-12-20 DIAGNOSIS — F17210 Nicotine dependence, cigarettes, uncomplicated: Secondary | ICD-10-CM | POA: Insufficient documentation

## 2018-12-20 LAB — CBG MONITORING, ED: Glucose-Capillary: 115 mg/dL — ABNORMAL HIGH (ref 70–99)

## 2018-12-20 IMAGING — CT CT HEAD CODE STROKE
3 series · 15 of 47 positions shown, 18 images · non-contrast
Comparison: None.

CLINICAL DATA: Code stroke.  Speech changes.

EXAM:
CT HEAD WITHOUT CONTRAST
TECHNIQUE: Contiguous axial images were obtained from the base of the skull
through the vertex without intravenous contrast.

[Series 3: head 5.0 st · axial · 0.42mm/px · z∈[-94,+50]mm · 9 of 35 slices shown, 12 images]
[im 3/35  brain]
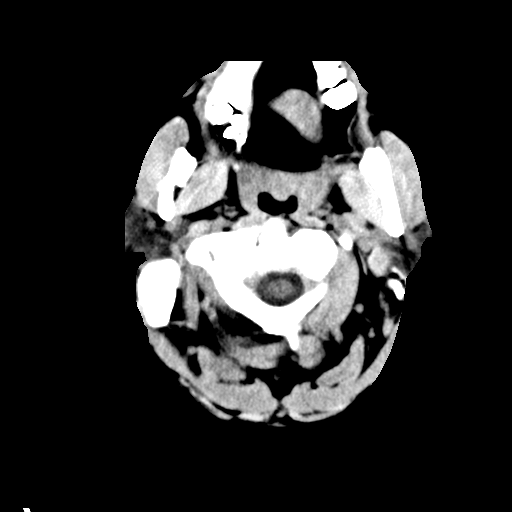
[im 3/35  bone]
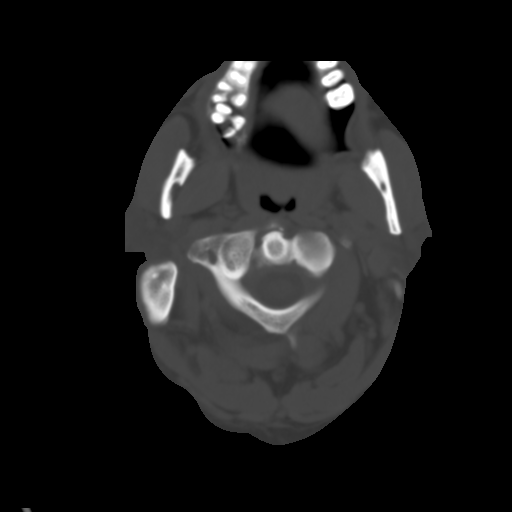
[im 6/35  brain]
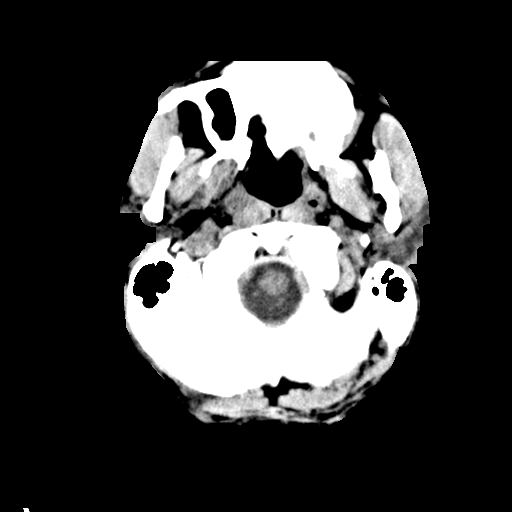
[im 10/35  brain]
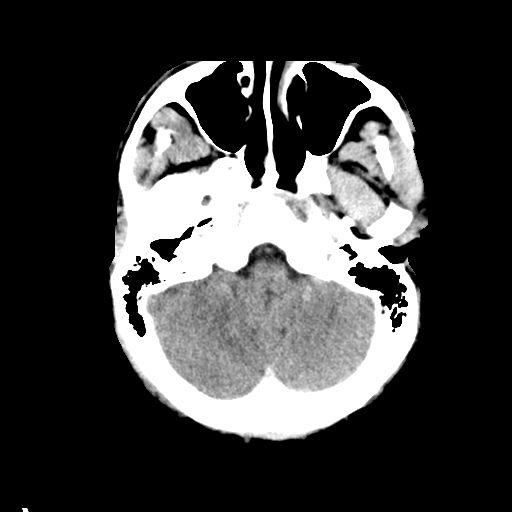
[im 13/35  brain]
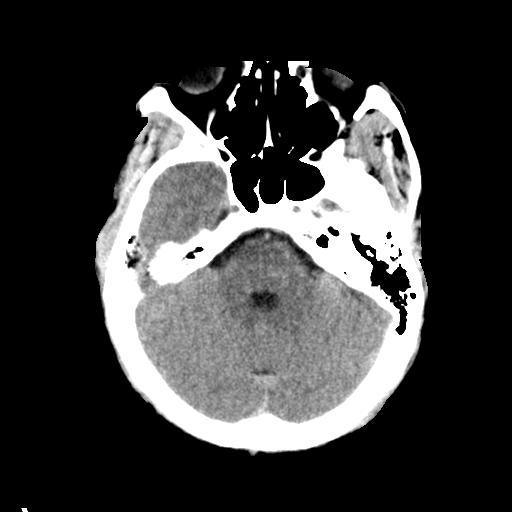
[im 18/35  brain]
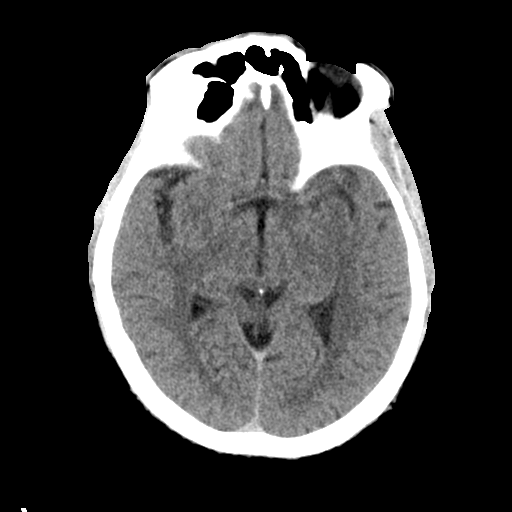
[im 18/35  bone]
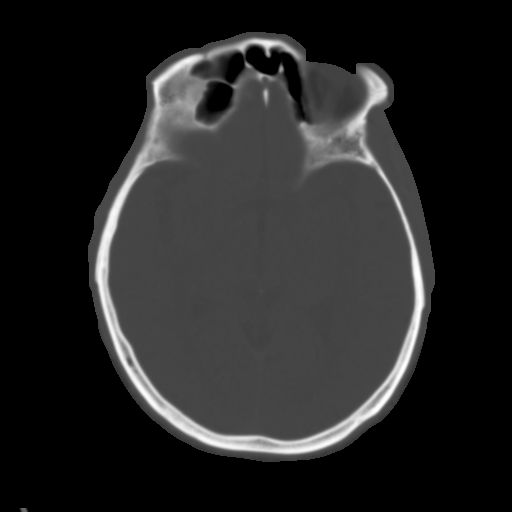
[im 22/35  brain]
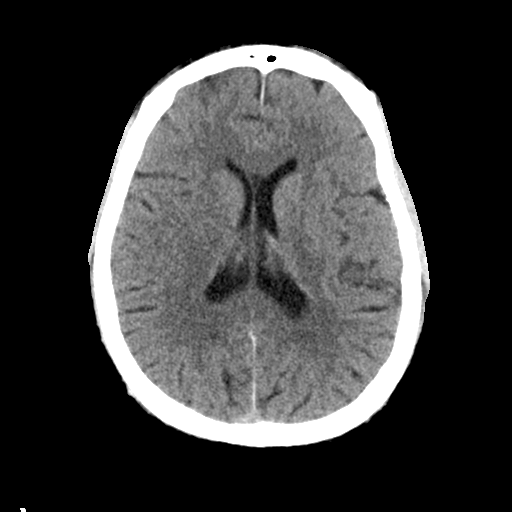
[im 25/35  brain]
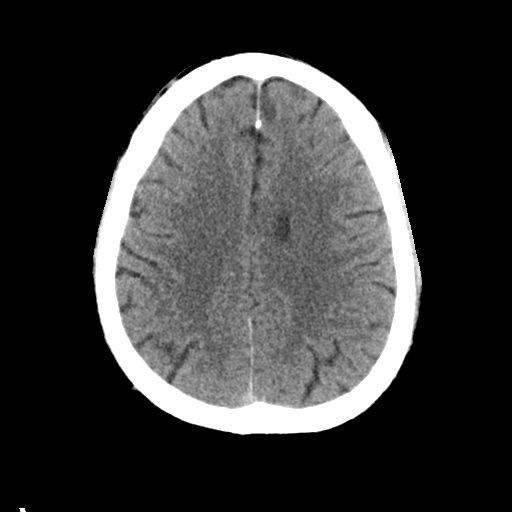
[im 29/35  brain]
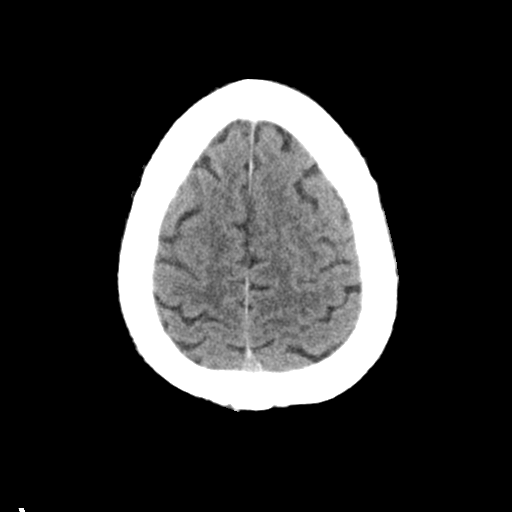
[im 32/35  brain]
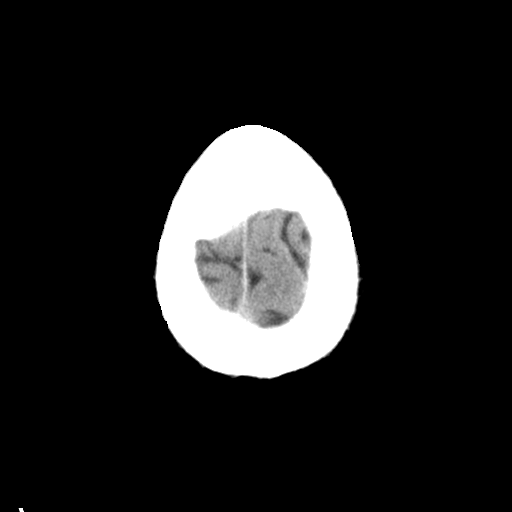
[im 32/35  bone]
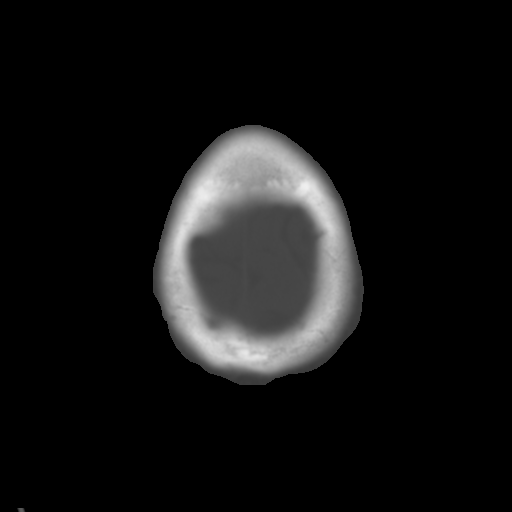

[Series 5: head 3.0 cor st · coronal · 0.36mm/px · 3 of 67 slices shown]
[im 23/67  brain]
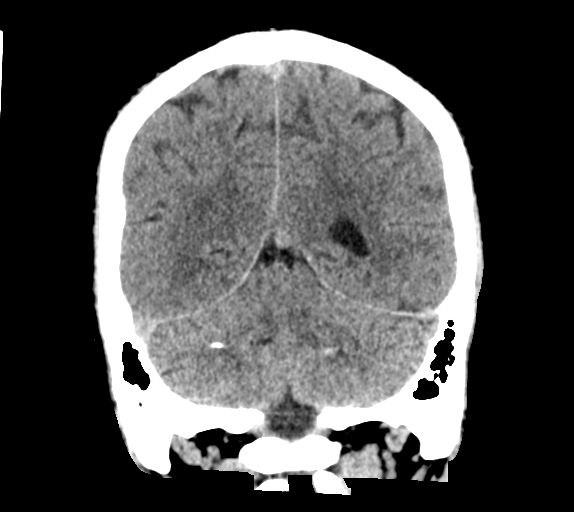
[im 30/67  brain]
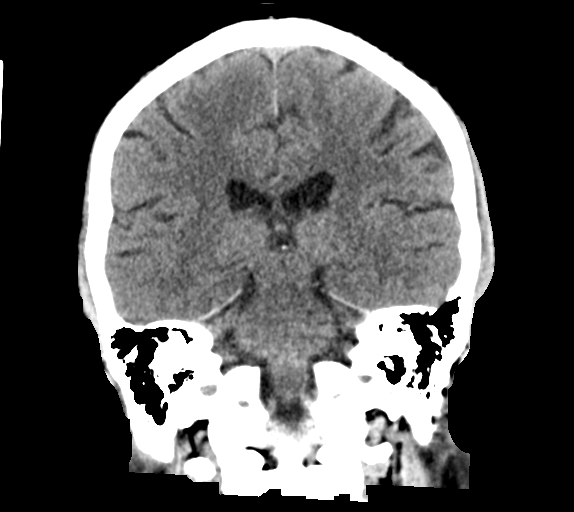
[im 37/67  brain]
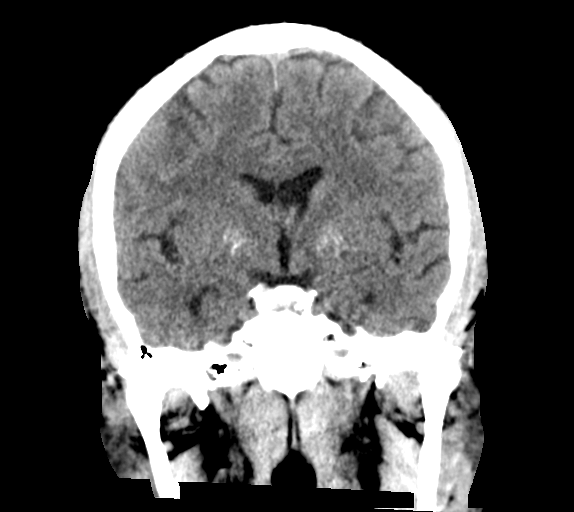

[Series 6: head 3.0 sag st · sagittal · 0.34mm/px · 3 of 58 slices shown]
[im 20/58  brain]
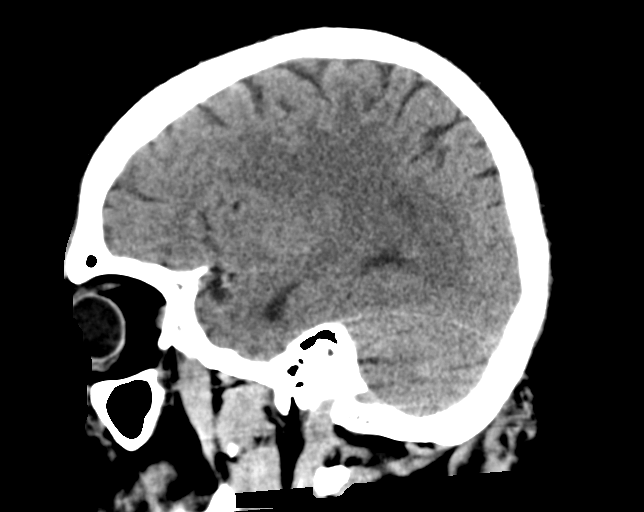
[im 29/58  brain]
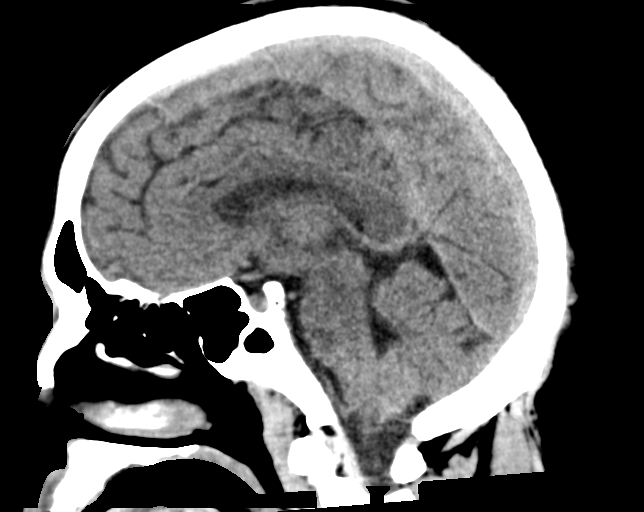
[im 39/58  brain]
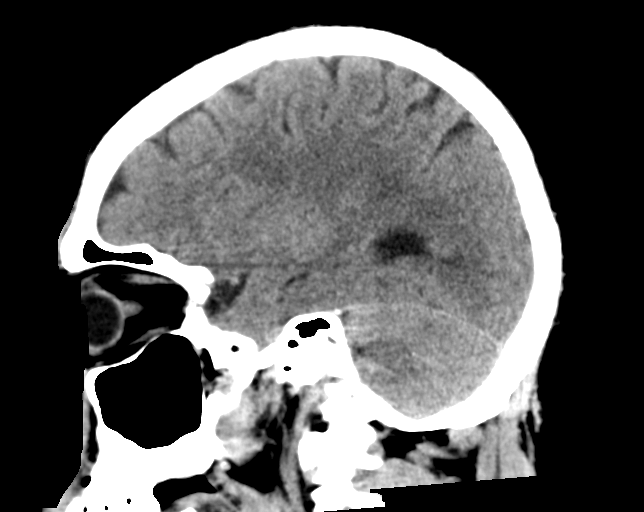

[15 of 47 positions shown; findings below may reference images not displayed]

FINDINGS: Brain: There is no mass, hemorrhage or extra-axial collection. The
size and configuration of the ventricles and extra-axial CSF spaces
are normal. There is hypoattenuation of the periventricular white
matter, most commonly indicating chronic ischemic microangiopathy.
Bilateral basal ganglia mineralization.

Vascular: No abnormal hyperdensity of the major intracranial
arteries or dural venous sinuses. No intracranial atherosclerosis.

Skull: The visualized skull base, calvarium and extracranial soft
tissues are normal.

Sinuses/Orbits: No fluid levels or advanced mucosal thickening of
the visualized paranasal sinuses. No mastoid or middle ear effusion.
The orbits are normal.

ASPECTS (Alberta Stroke Program Early CT Score)

- Ganglionic level infarction (caudate, lentiform nuclei, internal
capsule, insula, M1-M3 cortex): 7

- Supraganglionic infarction (M4-M6 cortex): 3

Total score (0-10 with 10 being normal): 10
IMPRESSION: 1. Chronic ischemic microangiopathy without acute intracranial
abnormality.
2. ASPECTS is 10.
3. These results were communicated to Dr. JHOANNA at [DATE]
on [DATE] by text page via the [employer] messaging system.

## 2018-12-20 MED ORDER — SODIUM CHLORIDE 0.9% FLUSH: 3.0000 mL | Freq: Once | INTRAVENOUS | Status: DC

## 2018-12-20 NOTE — ED Provider Notes (Signed)
[place] EMERGENCY DEPARTMENT Provider Note   CSN: [id number] Arrival date & time: 12/20/18  2349  An emergency department physician performed an initial assessment on this suspected stroke patient at 2349.  History   Chief Complaint Chief Complaint  Patient presents with   Code Stroke    HPI Donald York is a 58 y.o. male.     HPI   11:55pm Patient cleared for CT scan.  Patient presents as a code stroke.  He is a 58 year old male with unknown past medical history who presents as a code stroke.  Per EMS report he was sitting on the couch with a friend when he was noted to have speech difficulty and inability to get off the couch.  EMS noted some left-sided upper extremity drift.  Patient has not been able to answer questions or follow commands.  Neurologist at the bedside.  Level 5 caveat for altered mental status.  After returning from CT scan, patient does answer some questions with short responses.  He does report drinking alcohol and smoking marijuana prior to onset of events.  He intermittently will follow commands.  Question effort.  Past Medical History:  Diagnosis Date   Family history of adverse reaction to anesthesia    " [name] "   Peri-rectal abscess 06/09/2016    Patient Active Problem List   Diagnosis Date Noted   Duodenal ulcer 05/25/2018   Perirectal cellulitis 06/09/2016   AKI (acute kidney injury) ([place]) 06/09/2016   Tobacco abuse 06/09/2016   Cocaine use 06/09/2016    Past Surgical History:  Procedure Laterality Date   NO PAST SURGERIES          Home Medications    Prior to Admission medications   Not on File    Family History Family History  Problem Relation Age of Onset   Cancer Maternal Uncle     Social History Social History   Tobacco Use   Smoking status: Current Some Day Smoker    Packs/day: 0.50    Types: Cigarettes   Smokeless tobacco: Never Used  Substance Use Topics    Alcohol use: Yes    Comment: 40 oz beer, every now and then   Drug use: Yes    Types: Cocaine, Marijuana     Allergies   Patient has no known allergies.   Review of Systems Review of Systems  Unable to perform ROS: Mental status change     Physical Exam Updated Vital Signs BP 115/77    Pulse 81    Temp (!) 97.1 F (36.2 C) (Temporal)    Resp 16    Wt 76.5 kg    SpO2 98%    BMI 22.87 kg/m   Physical Exam Vitals signs and nursing note reviewed.  Constitutional:      Appearance: Normal appearance. He is well-developed.     Comments: Alert, tracks, no acute distress  HENT:     Head: Normocephalic and atraumatic.     Nose: Nose normal.     Mouth/Throat:     Mouth: Mucous membranes are moist.  Eyes:     Pupils: Pupils are equal, round, and reactive to light.     Comments: Blink to threat bilaterally intact, extraocular movements intact, patient tracks appropriately  Neck:     Musculoskeletal: Neck supple.  Cardiovascular:     Rate and Rhythm: Normal rate and regular rhythm.     Heart sounds: Normal heart sounds. No murmur.  Pulmonary:  Effort: Pulmonary effort is normal. No respiratory distress.     Breath sounds: Normal breath sounds. No wheezing.  Abdominal:     Palpations: Abdomen is soft.     Tenderness: There is no abdominal tenderness. There is no rebound.  Musculoskeletal:     Right lower leg: No edema.     Left lower leg: No edema.  Lymphadenopathy:     Cervical: No cervical adenopathy.  Skin:    General: Skin is warm and dry.  Neurological:     Mental Status: He is alert.     Comments: Difficult to assess orientation as patient initially was not talking, neurologic exam is overall fairly inconsistent, he did initially have some drift in the left upper extremity, no facial droop noted, unclear whether he was unable to follow commands versus effort, he did withdrawal to painful stimuli all 4 extremities After returning for CT scan, repeat exam, patient  now able to lift left upper extremity without significant drift, he is more conversant but is only answering in short sentences  Psychiatric:     Comments: Flat affect      ED Treatments / Results  Labs (all labs ordered are listed, but only abnormal results are displayed) Labs Reviewed  APTT - Abnormal; Notable for the following components:      Result Value   aPTT 22 (*)    All other components within normal limits  CBC - Abnormal; Notable for the following components:   WBC 14.9 (*)    All other components within normal limits  DIFFERENTIAL - Abnormal; Notable for the following components:   Neutro Abs 10.6 (*)    Monocytes Absolute 1.4 (*)    All other components within normal limits  COMPREHENSIVE METABOLIC PANEL - Abnormal; Notable for the following components:   Glucose, Bld 113 (*)    Creatinine, Ser 1.41 (*)    Calcium 8.5 (*)    Total Protein 5.1 (*)    Albumin 3.1 (*)    GFR calc non Af Amer 55 (*)    All other components within normal limits  ETHANOL - Abnormal; Notable for the following components:   Alcohol, Ethyl (B) 42 (*)    All other components within normal limits  RAPID URINE DRUG SCREEN, HOSP PERFORMED - Abnormal; Notable for the following components:   Cocaine POSITIVE (*)    All other components within normal limits  URINALYSIS, ROUTINE W REFLEX MICROSCOPIC - Abnormal; Notable for the following components:   Specific Gravity, Urine <1.005 (*)    Leukocytes,Ua TRACE (*)    All other components within normal limits  URINALYSIS, MICROSCOPIC (REFLEX) - Abnormal; Notable for the following components:   Bacteria, UA RARE (*)    All other components within normal limits  I-STAT CHEM 8, ED - Abnormal; Notable for the following components:   Creatinine, Ser 1.40 (*)    Glucose, Bld 111 (*)    Calcium, Ion 1.14 (*)    All other components within normal limits  CBG MONITORING, ED - Abnormal; Notable for the following components:   Glucose-Capillary 115 (*)     All other components within normal limits  PROTIME-INR    EKG EKG Interpretation  Date/Time:  Tuesday December 21 2018 00:28:31 EDT Ventricular Rate:  80 PR Interval:    QRS Duration: 83 QT Interval:  372 QTC Calculation: 430 R Axis:   61 Text Interpretation: Sinus rhythm Anteroseptal infarct, old No significant change since last tracing Confirmed by Thayer Jew [phone]) on  12/21/2018 1:45:08 AM   Radiology Ct Angio Head W Or Wo Contrast  Result Date: 12/21/2018 CLINICAL DATA:  Aphasia EXAM: CT ANGIOGRAPHY HEAD AND NECK TECHNIQUE: Multidetector CT imaging of the head and neck was performed using the standard protocol during bolus administration of intravenous contrast. Multiplanar CT image reconstructions and MIPs were obtained to evaluate the vascular anatomy. Carotid stenosis measurements (when applicable) are obtained utilizing NASCET criteria, using the distal internal carotid diameter as the denominator. CONTRAST:  65mL OMNIPAQUE IOHEXOL 350 MG/ML SOLN COMPARISON:  None. FINDINGS: CTA NECK FINDINGS SKELETON: There is no bony spinal canal stenosis. No lytic or blastic lesion. OTHER NECK: Normal pharynx, larynx and major salivary glands. No cervical lymphadenopathy. Unremarkable thyroid gland. UPPER CHEST: Biapical emphysema AORTIC ARCH: There is no calcific atherosclerosis of the aortic arch. There is no aneurysm, dissection or hemodynamically significant stenosis of the visualized portion of the aorta. Conventional 3 vessel aortic branching pattern. The visualized proximal subclavian arteries are widely patent. RIGHT CAROTID SYSTEM: Normal without aneurysm, dissection or stenosis. LEFT CAROTID SYSTEM: Normal without aneurysm, dissection or stenosis. VERTEBRAL ARTERIES: Codominant configuration. Both origins are clearly patent. There is no dissection, occlusion or flow-limiting stenosis to the skull base (V1-V3 segments). CTA HEAD FINDINGS POSTERIOR CIRCULATION: --Vertebral arteries:  Normal V4 segments. --Posterior inferior cerebellar arteries (PICA): Patent origins from the vertebral arteries. --Anterior inferior cerebellar arteries (AICA): Patent origins from the basilar artery. --Basilar artery: Normal. --Superior cerebellar arteries: Normal. --Posterior cerebral arteries: Normal. The left PCA is partially supplied by a posterior communicating artery (p-comm). ANTERIOR CIRCULATION: --Intracranial internal carotid arteries: Normal. --Anterior cerebral arteries (ACA): Normal. Both A1 segments are present. Patent anterior communicating artery (a-comm). --Middle cerebral arteries (MCA): Normal. VENOUS SINUSES: As permitted by contrast timing, patent. ANATOMIC VARIANTS: None Review of the MIP images confirms the above findings. IMPRESSION: No emergent large vessel occlusion or high-grade stenosis. Electronically Signed   By: Ulyses Jarred M.D.   On: 12/21/2018 00:48   Ct Angio Neck W Or Wo Contrast  Result Date: 12/21/2018 CLINICAL DATA:  Aphasia EXAM: CT ANGIOGRAPHY HEAD AND NECK TECHNIQUE: Multidetector CT imaging of the head and neck was performed using the standard protocol during bolus administration of intravenous contrast. Multiplanar CT image reconstructions and MIPs were obtained to evaluate the vascular anatomy. Carotid stenosis measurements (when applicable) are obtained utilizing NASCET criteria, using the distal internal carotid diameter as the denominator. CONTRAST:  46mL OMNIPAQUE IOHEXOL 350 MG/ML SOLN COMPARISON:  None. FINDINGS: CTA NECK FINDINGS SKELETON: There is no bony spinal canal stenosis. No lytic or blastic lesion. OTHER NECK: Normal pharynx, larynx and major salivary glands. No cervical lymphadenopathy. Unremarkable thyroid gland. UPPER CHEST: Biapical emphysema AORTIC ARCH: There is no calcific atherosclerosis of the aortic arch. There is no aneurysm, dissection or hemodynamically significant stenosis of the visualized portion of the aorta. Conventional 3 vessel  aortic branching pattern. The visualized proximal subclavian arteries are widely patent. RIGHT CAROTID SYSTEM: Normal without aneurysm, dissection or stenosis. LEFT CAROTID SYSTEM: Normal without aneurysm, dissection or stenosis. VERTEBRAL ARTERIES: Codominant configuration. Both origins are clearly patent. There is no dissection, occlusion or flow-limiting stenosis to the skull base (V1-V3 segments). CTA HEAD FINDINGS POSTERIOR CIRCULATION: --Vertebral arteries: Normal V4 segments. --Posterior inferior cerebellar arteries (PICA): Patent origins from the vertebral arteries. --Anterior inferior cerebellar arteries (AICA): Patent origins from the basilar artery. --Basilar artery: Normal. --Superior cerebellar arteries: Normal. --Posterior cerebral arteries: Normal. The left PCA is partially supplied by a posterior communicating artery (p-comm). ANTERIOR CIRCULATION: --Intracranial internal  carotid arteries: Normal. --Anterior cerebral arteries (ACA): Normal. Both A1 segments are present. Patent anterior communicating artery (a-comm). --Middle cerebral arteries (MCA): Normal. VENOUS SINUSES: As permitted by contrast timing, patent. ANATOMIC VARIANTS: None Review of the MIP images confirms the above findings. IMPRESSION: No emergent large vessel occlusion or high-grade stenosis. Electronically Signed   By: Ulyses Jarred M.D.   On: 12/21/2018 00:48   Mr Brain Wo Contrast  Result Date: 12/21/2018 CLINICAL DATA:  Left-sided weakness and difficulty speaking. EXAM: MRI HEAD WITHOUT CONTRAST TECHNIQUE: Multiplanar, multiecho pulse sequences of the brain and surrounding structures were obtained without intravenous contrast. COMPARISON:  None. FINDINGS: BRAIN: There is no acute infarct, acute hemorrhage or extra-axial collection. Multifocal white matter hyperintensity, most commonly due to chronic ischemic microangiopathy. 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 The cerebral and cerebellar volume are  age-appropriate. There is no hydrocephalus. The midline structures are normal. VASCULAR: The major intracranial arterial and venous sinus flow voids are normal. Susceptibility-sensitive sequences show no chronic microhemorrhage or superficial siderosis. SKULL AND UPPER CERVICAL SPINE: Calvarial bone marrow signal is normal. There is no skull base mass. The visualized upper cervical spine and soft tissues are normal. SINUSES/ORBITS: There are no fluid levels or advanced mucosal thickening. The mastoid air cells and middle ear cavities are free of fluid. The orbits are normal. IMPRESSION: 1. No acute intracranial abnormality. 2. 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 Electronically Signed   By: Ulyses Jarred M.D.   On: 12/21/2018 02:38   Ct Head Code Stroke Wo Contrast  Result Date: 12/21/2018 CLINICAL DATA:  Code stroke.  Speech changes. EXAM: CT HEAD WITHOUT CONTRAST TECHNIQUE: Contiguous axial images were obtained from the base of the skull through the vertex without intravenous contrast. COMPARISON:  None. FINDINGS: Brain: There is no mass, hemorrhage or extra-axial collection. The size and configuration of the ventricles and extra-axial CSF spaces are normal. There is hypoattenuation of the periventricular white matter, most commonly indicating chronic ischemic microangiopathy. Bilateral basal ganglia mineralization. Vascular: No abnormal hyperdensity of the major intracranial arteries or dural venous sinuses. No intracranial atherosclerosis. Skull: The visualized skull base, calvarium and extracranial soft tissues are normal. Sinuses/Orbits: No fluid levels or advanced mucosal thickening of the visualized paranasal sinuses. No mastoid or middle ear effusion. The orbits are normal. ASPECTS [facility] Stroke Program Early CT Score) - Ganglionic level infarction (caudate, lentiform nuclei, internal capsule, insula, M1-M3 cortex): 7 - Supraganglionic infarction (M4-M6  cortex): 3 Total score (0-10 with 10 being normal): 10 IMPRESSION: 1. Chronic ischemic microangiopathy without acute intracranial abnormality. 2. ASPECTS is 10. 3. These results were communicated to Dr. Karena Addison Aroor at 12:04 am on 12/21/2018 by text page via the [facility] messaging system. Electronically Signed   By: Ulyses Jarred M.D.   On: 12/21/2018 00:04    Procedures Procedures (including critical care time)  Medications Ordered in ED Medications  sodium chloride flush (NS) 0.9 % injection 3 mL (has no administration in time range)  iohexol (OMNIPAQUE) 350 MG/ML injection 75 mL (75 mLs Intravenous Contrast Given 12/21/18 0010)     Initial Impression / Assessment and Plan / ED Course  I have reviewed the triage vital signs and the nursing notes.  Pertinent labs & imaging results that were available during my care of the patient were reviewed by me and considered in my medical decision making (see chart for details).  Clinical Course as of Dec 20 301  Tue Dec 21, 2018  0256  MRI negative.  Work-up notable for blood alcohol level 42 and positive cocaine on UDS.  Otherwise no significant derangements.  On recheck, patient states he feels somewhat better.  Strength is now intact bilateral upper and bilateral lower extremities.  Requested that he be ambulated.   [CH]  0301 Per tech report, patient is ambulatory at his baseline and able to tolerate fluids.   [CH]    Clinical Course User Index [CH] Shemeca Lukasik, Barbette Hair, MD       Patient presents initially as a code stroke.  Neurologic exam is inconsistent.  ABCs intact and he is otherwise nontoxic-appearing.  Vital signs are reassuring.  Patient was evaluated by neurology.  CT, CT perfusion studies were largely reassuring.  He does report some alcohol and marijuana use tonight.  On repeat neurologic exams, he has progressive ability to speak and follow directions.  He also had increasing left upper extremity strength.  TPA was not given.  I  have reviewed his work-up.  Alcohol level 42 cocaine positive.  Otherwise no significant metabolic derangement.  MRI was obtained and is negative.  Per neurology, no further work-up necessary at this time.  Doubt seizure.  On repeat evaluation he is at his baseline and ambulates independently.  Will discharge home with primary care follow-up prn.  After history, exam, and medical workup I feel the patient has been appropriately medically screened and is safe for discharge home. Pertinent diagnoses were discussed with the patient. Patient was given return precautions.   Final Clinical Impressions(s) / ED Diagnoses   Final diagnoses:  Stroke-like episode  Polysubstance abuse [facility])    ED Discharge Orders    None       Dina Rich, Barbette Hair, MD 12/21/18 [phone]

## 2018-12-21 ENCOUNTER — Emergency Department (HOSPITAL_COMMUNITY): Payer: Self-pay

## 2018-12-21 ENCOUNTER — Encounter (HOSPITAL_COMMUNITY): Payer: Self-pay | Admitting: Radiology

## 2018-12-21 DIAGNOSIS — R2990 Unspecified symptoms and signs involving the nervous system: Secondary | ICD-10-CM

## 2018-12-21 DIAGNOSIS — F1910 Other psychoactive substance abuse, uncomplicated: Secondary | ICD-10-CM

## 2018-12-21 LAB — CBC
HCT: 45.5 % (ref 39.0–52.0)
Hemoglobin: 14.8 g/dL (ref 13.0–17.0)
MCH: 30.1 pg (ref 26.0–34.0)
MCHC: 32.5 g/dL (ref 30.0–36.0)
MCV: 92.7 fL (ref 80.0–100.0)
Platelets: 247 10*3/uL (ref 150–400)
RBC: 4.91 MIL/uL (ref 4.22–5.81)
RDW: 12.9 % (ref 11.5–15.5)
WBC: 14.9 10*3/uL — ABNORMAL HIGH (ref 4.0–10.5)
nRBC: 0 % (ref 0.0–0.2)

## 2018-12-21 LAB — URINALYSIS, MICROSCOPIC (REFLEX): RBC / HPF: NONE SEEN RBC/hpf (ref 0–5)

## 2018-12-21 LAB — URINALYSIS, ROUTINE W REFLEX MICROSCOPIC
Bilirubin Urine: NEGATIVE
Glucose, UA: NEGATIVE mg/dL
Hgb urine dipstick: NEGATIVE
Ketones, ur: NEGATIVE mg/dL
Nitrite: NEGATIVE
Protein, ur: NEGATIVE mg/dL
Specific Gravity, Urine: <1.005 — ABNORMAL LOW (ref 1.005–1.030)
pH: 6 (ref 5.0–8.0)

## 2018-12-21 LAB — I-STAT CHEM 8, ED
BUN: 10 mg/dL (ref 6–20)
Calcium, Ion: 1.14 mmol/L — ABNORMAL LOW (ref 1.15–1.40)
Chloride: 105 mmol/L (ref 98–111)
Creatinine, Ser: 1.4 mg/dL — ABNORMAL HIGH (ref 0.61–1.24)
Glucose, Bld: 111 mg/dL — ABNORMAL HIGH (ref 70–99)
HCT: 45 % (ref 39.0–52.0)
Hemoglobin: 15.3 g/dL (ref 13.0–17.0)
Potassium: 3.6 mmol/L (ref 3.5–5.1)
Sodium: 139 mmol/L (ref 135–145)
TCO2: 22 mmol/L (ref 22–32)

## 2018-12-21 LAB — COMPREHENSIVE METABOLIC PANEL
ALT: 19 U/L (ref 0–44)
AST: 22 U/L (ref 15–41)
Albumin: 3.1 g/dL — ABNORMAL LOW (ref 3.5–5.0)
Alkaline Phosphatase: 43 U/L (ref 38–126)
Anion gap: 10 (ref 5–15)
BUN: 10 mg/dL (ref 6–20)
CO2: 23 mmol/L (ref 22–32)
Calcium: 8.5 mg/dL — ABNORMAL LOW (ref 8.9–10.3)
Chloride: 106 mmol/L (ref 98–111)
Creatinine, Ser: 1.41 mg/dL — ABNORMAL HIGH (ref 0.61–1.24)
GFR calc Af Amer: >60 mL/min (ref >60)
GFR calc non Af Amer: 55 mL/min — ABNORMAL LOW (ref >60)
Glucose, Bld: 113 mg/dL — ABNORMAL HIGH (ref 70–99)
Potassium: 3.7 mmol/L (ref 3.5–5.1)
Sodium: 139 mmol/L (ref 135–145)
Total Bilirubin: 0.3 mg/dL (ref 0.3–1.2)
Total Protein: 5.1 g/dL — ABNORMAL LOW (ref 6.5–8.1)

## 2018-12-21 LAB — DIFFERENTIAL
Abs Immature Granulocytes: 0.05 10*3/uL (ref 0.00–0.07)
Basophils Absolute: 0 10*3/uL (ref 0.0–0.1)
Basophils Relative: 0 %
Eosinophils Absolute: 0.1 10*3/uL (ref 0.0–0.5)
Eosinophils Relative: 0 %
Immature Granulocytes: 0 %
Lymphocytes Relative: 18 %
Lymphs Abs: 2.7 10*3/uL (ref 0.7–4.0)
Monocytes Absolute: 1.4 10*3/uL — ABNORMAL HIGH (ref 0.1–1.0)
Monocytes Relative: 10 %
Neutro Abs: 10.6 10*3/uL — ABNORMAL HIGH (ref 1.7–7.7)
Neutrophils Relative %: 72 %

## 2018-12-21 LAB — RAPID URINE DRUG SCREEN, HOSP PERFORMED
Amphetamines: NOT DETECTED
Barbiturates: NOT DETECTED
Benzodiazepines: NOT DETECTED
Cocaine: POSITIVE — AB
Opiates: NOT DETECTED
Tetrahydrocannabinol: NOT DETECTED

## 2018-12-21 LAB — PROTIME-INR
INR: 1 (ref 0.8–1.2)
Prothrombin Time: 13.3 seconds (ref 11.4–15.2)

## 2018-12-21 LAB — APTT: aPTT: 22 seconds — ABNORMAL LOW (ref 24–36)

## 2018-12-21 LAB — ETHANOL: Alcohol, Ethyl (B): 42 mg/dL — ABNORMAL HIGH (ref <10)

## 2018-12-21 IMAGING — MR MR HEAD W/O CM
12 of 13 series · 43 of 48 positions shown · non-contrast
Comparison: None.

CLINICAL DATA: Left-sided weakness and difficulty speaking.

EXAM:
MRI HEAD WITHOUT CONTRAST
TECHNIQUE: Multiplanar, multiecho pulse sequences of the brain and surrounding
structures were obtained without intravenous contrast.

[Series 5: DWI · axial · 3.0mm · 0.88mm/px · z∈[-95,+41]mm · 6 of 94 slices shown (1 of 4)]
[im 1/94]
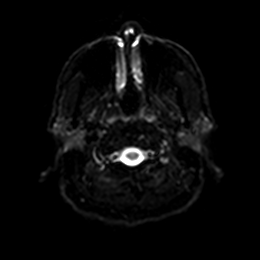
[im 19/94]
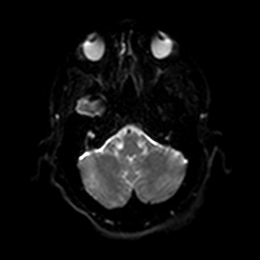
[im 38/94]
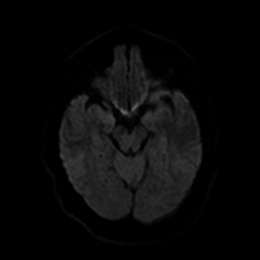
[im 56/94]
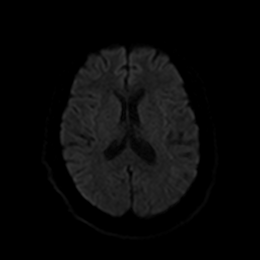
[im 75/94]
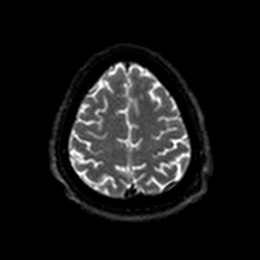
[im 94/94]
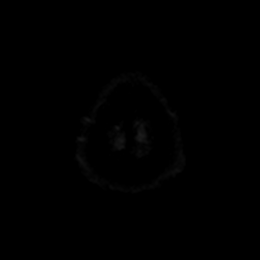

[Series 6: DWI · axial · 3.0mm · 0.88mm/px · z∈[-95,+41]mm · 3 of 47 slices shown (2 of 4)]
[im 1/47]
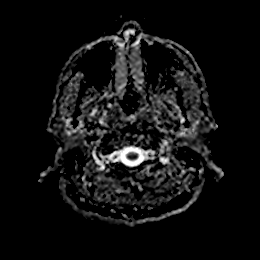
[im 24/47]
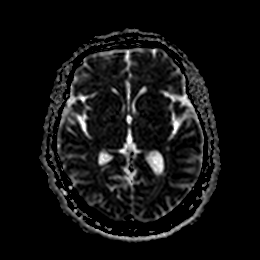
[im 47/47]
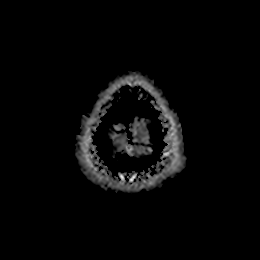

[Series 7: DWI · coronal · 4.0mm · 0.88mm/px · 5 of 68 slices shown (3 of 4)]
[im 1/68]
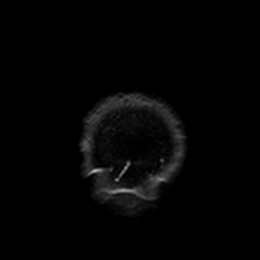
[im 17/68]
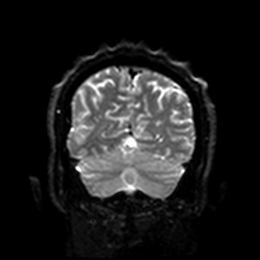
[im 34/68]
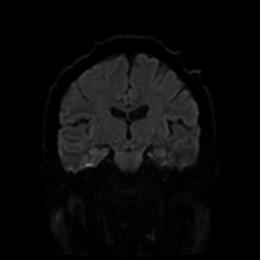
[im 51/68]
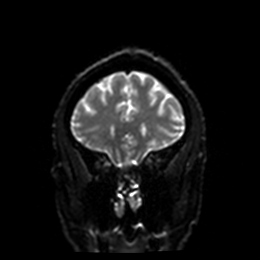
[im 68/68]
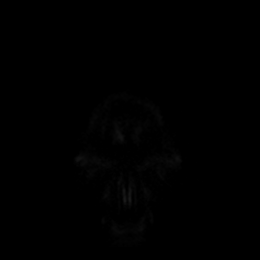

[Series 8: DWI · coronal · 4.0mm · 0.88mm/px · 3 of 34 slices shown (4 of 4)]
[im 1/34]
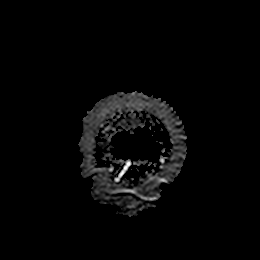
[im 17/34]
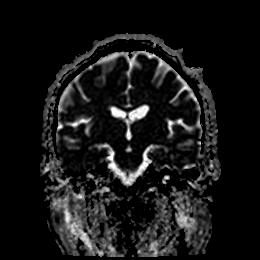
[im 34/34]
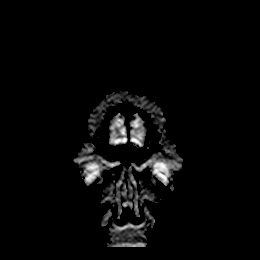

[Series 9: T1 · sagittal · 5.0mm · 0.75mm/px · 2 of 23 slices shown]
[im 1/23]
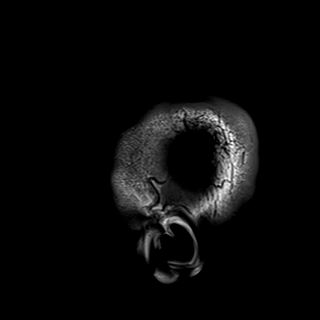
[im 23/23]
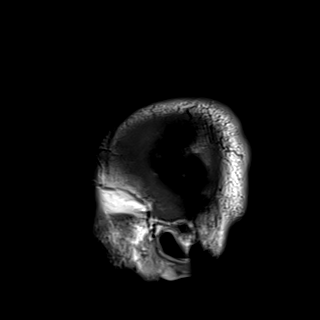

[Series 10: T2 · axial · 5.0mm · 0.72mm/px · z∈[-100,+43]mm · 2 of 25 slices shown (1 of 2)]
[im 1/25]
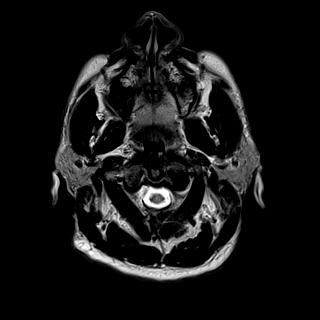
[im 25/25]
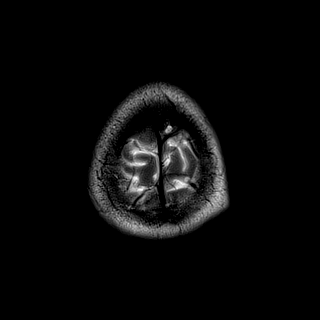

[Series 11: FLAIR · axial · 5.0mm · 0.45mm/px · z∈[-102,+40]mm · 2 of 25 slices shown]
[im 1/25]
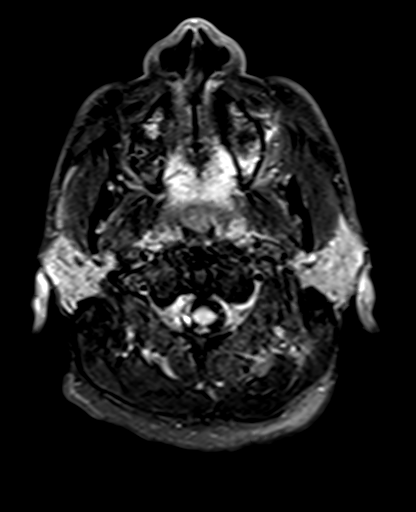
[im 25/25]
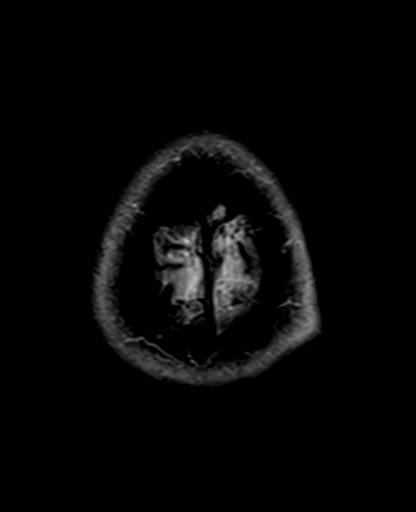

[Series 12: mag_images · axial · 3.0mm · 0.90mm/px · z∈[-111,+64]mm · 5 of 60 slices shown]
[im 1/60]
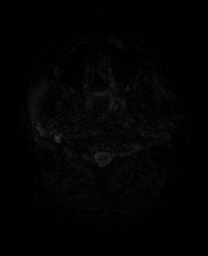
[im 15/60]
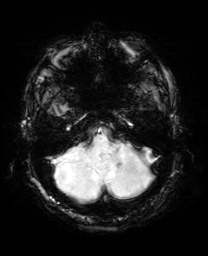
[im 30/60]
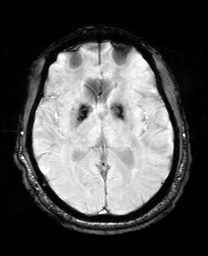
[im 45/60]
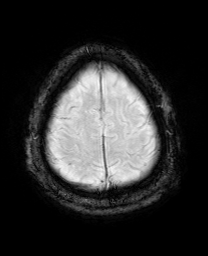
[im 60/60]
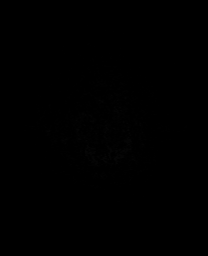

[Series 13: pha_images · axial · 3.0mm · 0.90mm/px · z∈[-111,+61]mm · 4 of 58 slices shown]
[im 1/58]
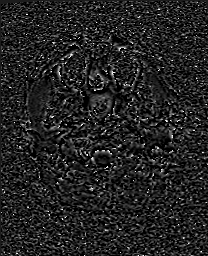
[im 20/58]
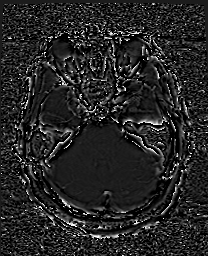
[im 39/58]
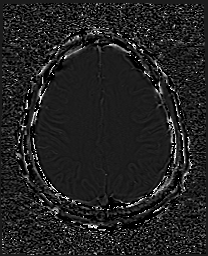
[im 58/58]
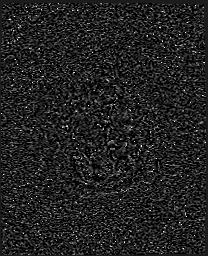

[Series 14: swi_images · axial · 3.0mm · 0.90mm/px · z∈[-111,+64]mm · 5 of 60 slices shown]
[im 1/60]
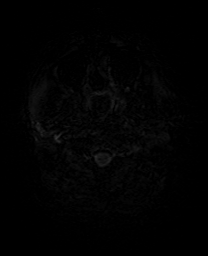
[im 15/60]
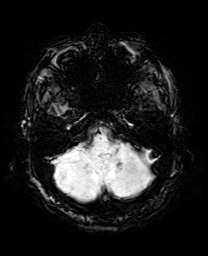
[im 30/60]
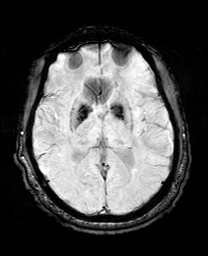
[im 45/60]
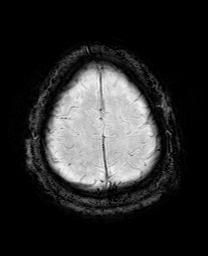
[im 60/60]
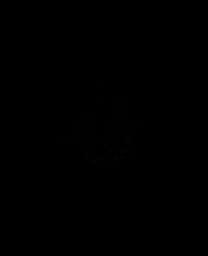

[Series 15: mip_images(sw) · axial · 24.0mm · 0.90mm/px · z∈[-101,+54]mm · 4 of 53 slices shown]
[im 1/53]
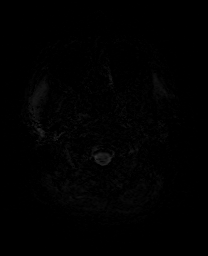
[im 18/53]
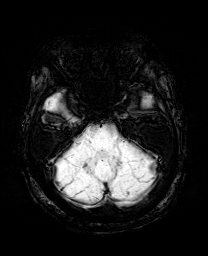
[im 35/53]
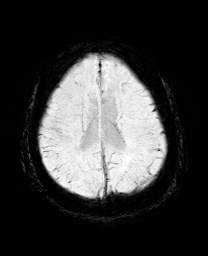
[im 53/53]
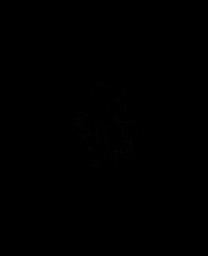

[Series 18: T2 · coronal · 5.0mm · 0.72mm/px · 2 of 28 slices shown (2 of 2)]
[im 1/28]
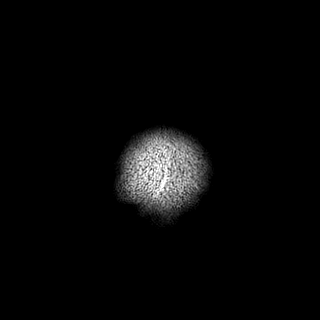
[im 28/28]
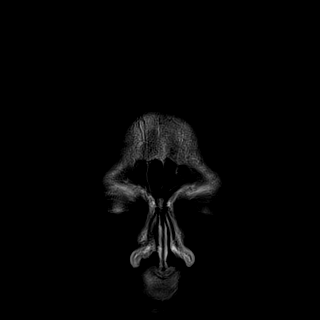

[43 of 48 positions shown; findings below may reference images not displayed]

FINDINGS: BRAIN: There is no acute infarct, acute hemorrhage or extra-axial
collection. Multifocal white matter hyperintensity, most commonly
due to chronic ischemic microangiopathy. There is an old right
cerebellar small vessel infarct. The cerebral and cerebellar volume
are age-appropriate. There is no hydrocephalus. The midline
structures are normal.

VASCULAR: The major intracranial arterial and venous sinus flow
voids are normal. Susceptibility-sensitive sequences show no chronic
microhemorrhage or superficial siderosis.

SKULL AND UPPER CERVICAL SPINE: Calvarial bone marrow signal is
normal. There is no skull base mass. The visualized upper cervical
spine and soft tissues are normal.

SINUSES/ORBITS: There are no fluid levels or advanced mucosal
thickening. The mastoid air cells and middle ear cavities are free
of fluid. The orbits are normal.
IMPRESSION: 1. No acute intracranial abnormality.
2. Chronic ischemic microangiopathy and old right cerebellar small
vessel infarct.

## 2018-12-21 IMAGING — CT CT ANGIO NECK
2 of 7 series · 8 of 33 positions shown · IV contrast (OMNI 350)
Comparison: None.

CLINICAL DATA: Aphasia

EXAM:
CT ANGIOGRAPHY HEAD AND NECK
TECHNIQUE: Multidetector CT imaging of the head and neck was performed using
the standard protocol during bolus administration of intravenous
contrast. Multiplanar CT image reconstructions and MIPs were
obtained to evaluate the vascular anatomy. Carotid stenosis
measurements (when applicable) are obtained utilizing NASCET
criteria, using the distal internal carotid diameter as the
denominator.
CONTRAST:  75mL OMNIPAQUE IOHEXOL 350 MG/ML SOLN

[Series 5: cta neck · axial · 0.42mm/px · z∈[-139,-29]mm · 2 of 166 slices shown]
[im 56/166  soft-tissue]
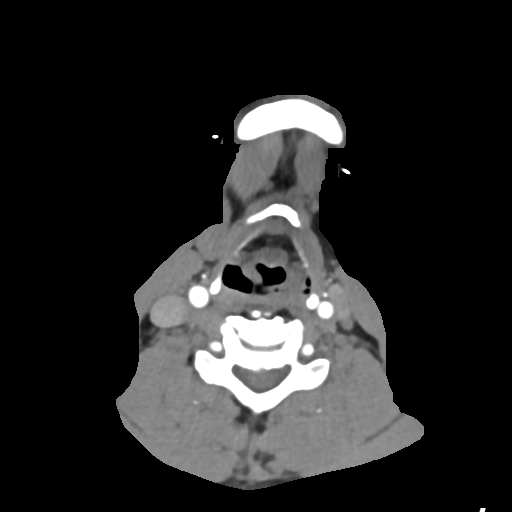
[im 111/166  soft-tissue]
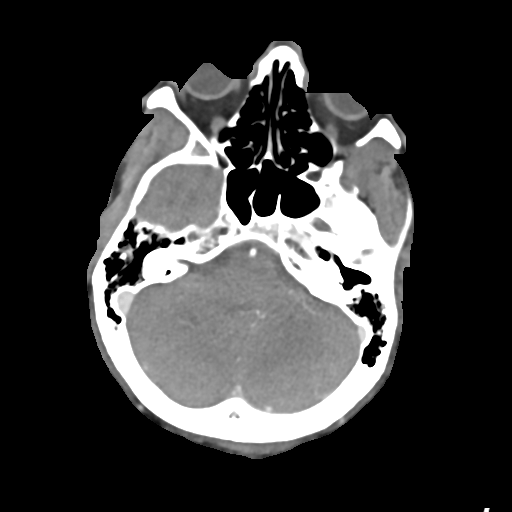

[Series 7: cta neck axial · axial · 0.39mm/px · z∈[-205,+35]mm · 6 of 337 slices shown]
[im 49/337  soft-tissue]
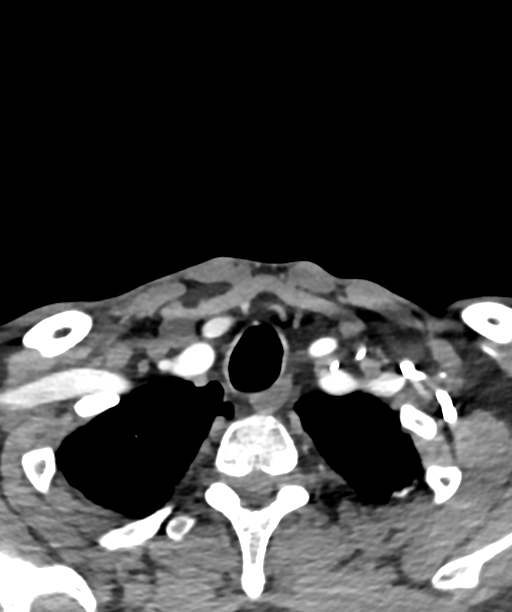
[im 97/337  bone]
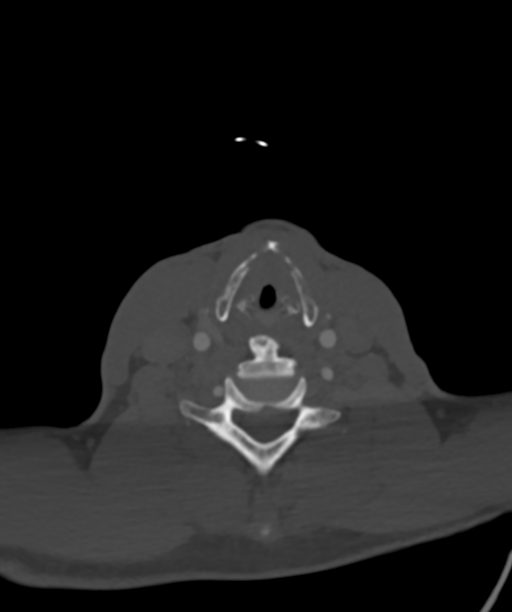
[im 145/337  soft-tissue]
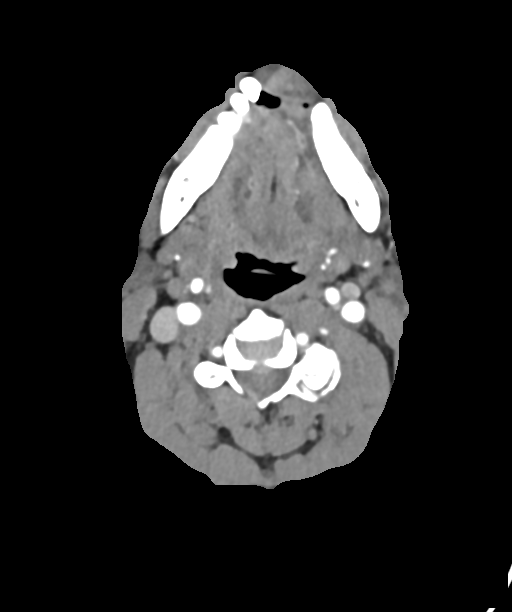
[im 193/337  bone]
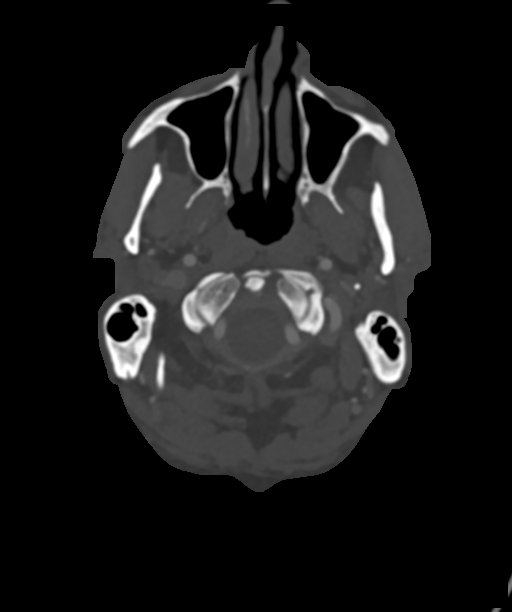
[im 241/337  soft-tissue]
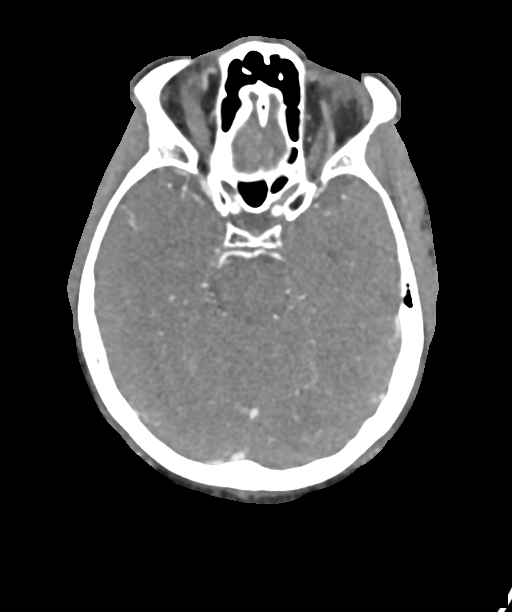
[im 289/337  bone]
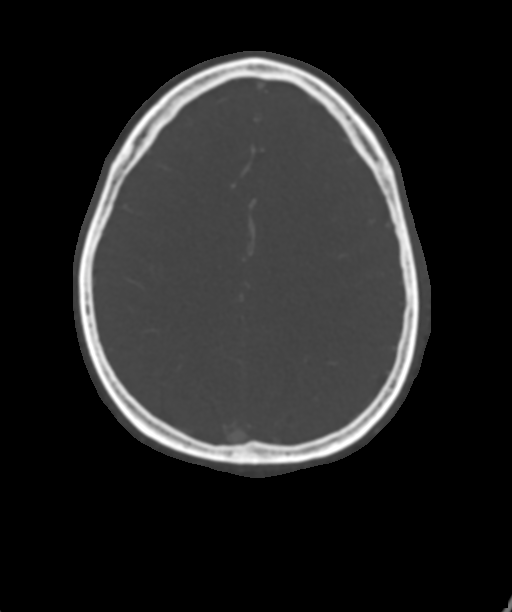

[8 of 33 positions shown; findings below may reference images not displayed]

FINDINGS: CTA NECK FINDINGS

SKELETON: There is no bony spinal canal stenosis. No lytic or
blastic lesion.

OTHER NECK: Normal pharynx, larynx and major salivary glands. No
cervical lymphadenopathy. Unremarkable thyroid gland.

UPPER CHEST: Biapical emphysema

AORTIC ARCH:

There is no calcific atherosclerosis of the aortic arch. There is no
aneurysm, dissection or hemodynamically significant stenosis of the
visualized portion of the aorta. Conventional 3 vessel aortic
branching pattern. The visualized proximal subclavian arteries are
widely patent.

RIGHT CAROTID SYSTEM: Normal without aneurysm, dissection or
stenosis.

LEFT CAROTID SYSTEM: Normal without aneurysm, dissection or
stenosis.

VERTEBRAL ARTERIES: Codominant configuration. Both origins are
clearly patent. There is no dissection, occlusion or flow-limiting
stenosis to the skull base (V1-V3 segments).

CTA HEAD FINDINGS

POSTERIOR CIRCULATION:

--Vertebral arteries: Normal V4 segments.

--Posterior inferior cerebellar arteries (PICA): Patent origins from
the vertebral arteries.

--Anterior inferior cerebellar arteries (AICA): Patent origins from
the basilar artery.

--Basilar artery: Normal.

--Superior cerebellar arteries: Normal.

--Posterior cerebral arteries: Normal. The left PCA is partially
supplied by a posterior communicating artery (p-comm).

ANTERIOR CIRCULATION:

--Intracranial internal carotid arteries: Normal.

--Anterior cerebral arteries (ACA): Normal. Both A1 segments are
present. Patent anterior communicating artery (a-comm).

--Middle cerebral arteries (MCA): Normal.

VENOUS SINUSES: As permitted by contrast timing, patent.

ANATOMIC VARIANTS: None

Review of the MIP images confirms the above findings.
IMPRESSION: No emergent large vessel occlusion or high-grade stenosis.

## 2018-12-21 MED ORDER — IOHEXOL 350 MG/ML SOLN
75.0000 mL | Freq: Once | INTRAVENOUS | Status: AC | PRN
  Administered 2018-12-21: 75 mL via INTRAVENOUS

## 2018-12-21 NOTE — Discharge Instructions (Addendum)
You were seen today for altered level of consciousness and strokelike symptoms.  Your work-up is reassuring including MRI.  You are back to your baseline.  You did have evidence of cocaine on UDS.  Sometimes polysubstance abuse can cause alterations in consciousness and mental status.

## 2018-12-21 NOTE — ED Notes (Signed)
Attempted to call pt's mother for a ride home, unable to reach.

## 2018-12-21 NOTE — ED Notes (Signed)
Pt speaking to sister on the phone

## 2018-12-21 NOTE — ED Notes (Signed)
Pt taken to MRI  

## 2018-12-21 NOTE — ED Triage Notes (Signed)
Pt was at home talking to a friend when he started having difficulty speaking to friend with L sided weakness per EMS. Pt is alert, intermittently verbal, requesting to call sister but was unable to recall her number

## 2018-12-21 NOTE — Consult Note (Signed)
Requesting Physician: Dr. Dina Rich    Chief Complaint: difficulty speaking   History obtained from: Patient and Chart  HPI:                                                                                                                                       Donald York is a 58 y.o. male with PMH of perirectal abscess presents as a code stroke for sudden onset difficulty speaking and following commands.  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 EMS arrived at the scene, patient was awake but non verbal and not following commands. They raised both arms however left arm fell to the ground immediately. BP was [id number] systolic. \  On arrival, patient was awake and non verbal initally, but later gave was his name and name of contact person. Then when age and month questions were asked, he did not respond. Withdraws in all 4 extremities and blinks to threat bilaterally.  Stat CT unremarkable. CTA head obtained;   Date last known well: 10.26/20 Time last known well: 11 PM tPA Given: No, low suspicion for stroke NIHSS: 8 Baseline MRS 0    Past Medical History:  Diagnosis Date  . Family history of adverse reaction to anesthesia    " [name] "  . Peri-rectal abscess 06/09/2016    Past Surgical History:  Procedure Laterality Date  . NO PAST SURGERIES      Family History  Problem Relation Age of Onset  . Cancer Maternal Uncle    Social History:  reports that he has been smoking cigarettes. He has been smoking about 0.50 packs per day. He has never used smokeless tobacco. He reports current alcohol use. He reports current drug use. Drugs: Cocaine and Marijuana.  Allergies: No Known Allergies  Medications:                                                                                                                       I reviewed home medications   ROS:  14 systems reviewed and negative except above   Examination:                                                                                                      General: Appears well-developed  Psych: Affect appropriate to situation Eyes: No scleral injection HENT: No OP obstrucion Head: Normocephalic.  Cardiovascular: Normal rate and regular rhythm.  Respiratory: Effort normal and breath sounds normal to anterior ascultation GI: Soft.  No distension. There is no tenderness.  Skin: WDI    Neurological Examination Mental Status: Alert, intermittently answers questions such as his name and volunteers information regarding contact.  At times is nonverbal.  Follows commands. Cranial Nerves: II: Visual fields grossly normal,  III,IV, VI: ptosis not present, extra-ocular motions intact bilaterally, pupils equal, round, reactive to light and accommodation V,VII: smile symmetric VIII: hearing normal bilaterally IX,X: uvula rises symmetrically XI: bilateral shoulder shrug XII: midline tongue extension Motor: Moves all 4 extremities with good strength, no obvious drift noted in bilateral upper extremities Tone and bulk:normal tone throughout; no atrophy noted Sensory: Difficult to assess due to mental status Plantars: Right: downgoing   Left: downgoing Cerebellar: No gross ataxia noted      Lab Results: Basic Metabolic Panel: No results for input(s): NA, K, CL, CO2, GLUCOSE, BUN, CREATININE, CALCIUM, MG, PHOS in the last 168 hours.  CBC: No results for input(s): WBC, NEUTROABS, HGB, HCT, MCV, PLT in the last 168 hours.  Coagulation Studies: No results for input(s): LABPROT, INR in the last 72 hours.  Imaging: No results found.   ASSESSMENT AND PLAN  58 y.o. male with PMH of perirectal abscess presents as a code stroke for sudden onset difficulty speaking and following commands.   Stat CT head was negative and CT angiogram was negative for large vessel occlusion.  Exam seem inconsistent, no clear unilateral motor weakness or facial droop appreciated.  Patient says the name, and then abruptly stops following commands.  No gaze deviation or rhythmic movements in history or exam to raise suspicion for seizure.   Acute metabolic encephalopathy  Recommendations MRI brain stat to rule out stroke Urine drug screen, EtOH levels Metabolic work-up   Addendum -Patient discharged from ED  Reviewed MRI brain: No acute findings.  Patient also returned to baseline.  UDS was positive for cocaine and marijuana. Suspect encephalopathy secondary to substance abuse  Arnol Mcgibbon [facility] Pager Number [phone]

## 2018-12-21 NOTE — ED Notes (Signed)
Pt admits to ETOH, marijuana, and cocaine use

## 2019-01-25 DIAGNOSIS — E559 Vitamin D deficiency, unspecified: Secondary | ICD-10-CM

## 2019-01-25 HISTORY — DX: Vitamin D deficiency, unspecified: E55.9

## 2019-01-31 ENCOUNTER — Other Ambulatory Visit: Payer: Self-pay

## 2019-01-31 ENCOUNTER — Encounter: Payer: Self-pay | Admitting: Family Medicine

## 2019-01-31 ENCOUNTER — Ambulatory Visit (INDEPENDENT_AMBULATORY_CARE_PROVIDER_SITE_OTHER): Payer: Self-pay | Admitting: Family Medicine

## 2019-01-31 VITALS — BP 101/73 | HR 67 | Temp 98.0°F | Resp 16 | Ht 72.0 in | Wt 167.0 lb

## 2019-01-31 DIAGNOSIS — R202 Paresthesia of skin: Secondary | ICD-10-CM

## 2019-01-31 DIAGNOSIS — Z7689 Persons encountering health services in other specified circumstances: Secondary | ICD-10-CM

## 2019-01-31 DIAGNOSIS — R2990 Unspecified symptoms and signs involving the nervous system: Secondary | ICD-10-CM

## 2019-01-31 DIAGNOSIS — F1910 Other psychoactive substance abuse, uncomplicated: Secondary | ICD-10-CM

## 2019-01-31 DIAGNOSIS — Z7289 Other problems related to lifestyle: Secondary | ICD-10-CM

## 2019-01-31 DIAGNOSIS — Z09 Encounter for follow-up examination after completed treatment for conditions other than malignant neoplasm: Secondary | ICD-10-CM

## 2019-01-31 DIAGNOSIS — R200 Anesthesia of skin: Secondary | ICD-10-CM

## 2019-01-31 DIAGNOSIS — Z789 Other specified health status: Secondary | ICD-10-CM

## 2019-01-31 DIAGNOSIS — Z0000 Encounter for general adult medical examination without abnormal findings: Secondary | ICD-10-CM

## 2019-01-31 DIAGNOSIS — F1490 Cocaine use, unspecified, uncomplicated: Secondary | ICD-10-CM

## 2019-01-31 DIAGNOSIS — Z720 Tobacco use: Secondary | ICD-10-CM

## 2019-01-31 DIAGNOSIS — R42 Dizziness and giddiness: Secondary | ICD-10-CM

## 2019-01-31 LAB — POCT URINALYSIS DIPSTICK
Bilirubin, UA: NEGATIVE
Blood, UA: NEGATIVE
Glucose, UA: NEGATIVE
Ketones, UA: NEGATIVE
Nitrite, UA: NEGATIVE
Protein, UA: NEGATIVE
Spec Grav, UA: 1.02 (ref 1.010–1.025)
Urobilinogen, UA: 0.2 E.U./dL
pH, UA: 6 (ref 5.0–8.0)

## 2019-01-31 LAB — POCT GLYCOSYLATED HEMOGLOBIN (HGB A1C): Hemoglobin A1C: 5.2 % (ref 4.0–5.6)

## 2019-01-31 LAB — GLUCOSE, POCT (MANUAL RESULT ENTRY): POC Glucose: 92 mg/dl (ref 70–99)

## 2019-01-31 NOTE — Progress Notes (Addendum)
[facility]   Re-establish Care--Hospital Follow Up  Subjective:  Patient ID: Donald York, male    DOB: 13-Jul-1960  Age: 58 y.o. MRN: [id number]  CC:  Chief Complaint  Patient presents with  . Hospitalization Follow-up    for stroke     HPI Donald York is a 58 year old male who presents for Hospital Follow Up and to Establish Care.   Past Medical History:  Diagnosis Date  . Alcohol use   . Family history of adverse reaction to anesthesia    " [name] "  . Peri-rectal abscess 06/09/2016  . Tobacco use    Current Status: This will be his initial office visit with me.  He was previously seen in our office 08/2017 for his PCP needs. Since his last office visit, he has had Stroke-like episodes since his first episode in 12/20/2018, he reports his last episode was on 01/21/2019. He also continues to experience episodes of light-headedness, dizziness, and equilibrium/balance is off, with numbness and weakness in right extremity. He has employment with daily work, and is now finding it hard to keep a job because of symptoms. Today is doing well with no complaints. He has not followed up with Neurologist as of yet. He reports occasional headaches, visual changes, and dizziness. He denies falls. His anxiety is increased today, r/t intermittent signs and symptoms, and financial restrictions. He denies suicidal ideations, homicidal ideations, or auditory hallucinations. He drinks one alcoholic beverage a day. He smokes 1 pack of cigarettes for 1 and 1/2 day. He denies fevers, chills, fatigue, recent infections, weight loss, and night sweats. No chest pain, heart palpitations, cough and shortness of breath reported. No reports of GI problems such as nausea, vomiting, diarrhea, and constipation. He has no reports of blood in stools, dysuria and hematuria. He denies pain today.   Past Surgical History:  Procedure Laterality Date  . NO PAST  SURGERIES      Family History  Problem Relation Age of Onset  . Cancer Maternal Uncle     Social History   Socioeconomic History  . Marital status: Single    Spouse name: Not on file  . Number of children: Not on file  . Years of education: Not on file  . Highest education level: Not on file  Occupational History  . Not on file  Social Needs  . Financial resource strain: Not on file  . Food insecurity    Worry: Not on file    Inability: Not on file  . Transportation needs    Medical: Not on file    Non-medical: Not on file  Tobacco Use  . Smoking status: Current Some Day Smoker    Packs/day: 0.50    Types: Cigarettes  . Smokeless tobacco: Never Used  Substance and Sexual Activity  . Alcohol use: Yes    Comment: 40 oz beer, every now and then  . Drug use: Yes    Types: Cocaine, Marijuana  . Sexual activity: Not on file  Lifestyle  . Physical activity    Days per week: Not on file    Minutes per session: Not on file  . Stress: Not on file  Relationships  . Social Herbalist on phone: Not on file    Gets together: Not on file    Attends religious service: Not on file    Active member of club or organization: Not on file  Attends meetings of clubs or organizations: Not on file    Relationship status: Not on file  . Intimate partner violence    Fear of current or ex partner: Not on file    Emotionally abused: Not on file    Physically abused: Not on file    Forced sexual activity: Not on file  Other Topics Concern  . Not on file  Social History Narrative  . Not on file    No outpatient medications prior to visit.   No facility-administered medications prior to visit.     No Known Allergies  ROS Review of Systems  Constitutional: Negative.   HENT: Negative.   Eyes: Negative.   Respiratory: Negative.   Cardiovascular: Negative.   Gastrointestinal: Negative.   Endocrine: Negative.   Genitourinary: Negative.   Musculoskeletal: Negative.    Allergic/Immunologic: Negative.   Neurological: Positive for dizziness (occasional ), weakness (right-sided), light-headedness (occasional ), numbness (right-sided ) and headaches (occasional).  Hematological: Negative.   Psychiatric/Behavioral: Negative.      Objective:    Physical Exam  Constitutional: He is oriented to person, place, and time. He appears well-developed and well-nourished.  HENT:  Head: Normocephalic and atraumatic.  Eyes: Conjunctivae are normal.  Neck: Normal range of motion. Neck supple.  Cardiovascular: Normal rate, regular rhythm, normal heart sounds and intact distal pulses.  Pulmonary/Chest: Effort normal and breath sounds normal.  Abdominal: Soft. Bowel sounds are normal.  Musculoskeletal: Normal range of motion.  Neurological: He is alert and oriented to person, place, and time. He has normal reflexes.  Mild right-sided weakness  Skin: Skin is warm and dry.  Psychiatric: He has a normal mood and affect. His behavior is normal. Judgment and thought content normal.  Nursing note and vitals reviewed.   BP 101/73 (BP Location: Left Arm, Patient Position: Sitting, Cuff Size: Normal)   Pulse 67   Temp 98 F (36.7 C) (Oral)   Resp 16   Ht 6' (1.829 m)   Wt 167 lb (75.8 kg)   SpO2 98%   BMI 22.65 kg/m  Wt Readings from Last 3 Encounters:  01/31/19 167 lb (75.8 kg)  12/21/18 168 lb 10.4 oz (76.5 kg)  10/05/18 164 lb (74.4 kg)     Health Maintenance Due  Topic Date Due  . Hepatitis C Screening  09/29/1960  . COLONOSCOPY  03/22/2010    There are no preventive care reminders to display for this patient.  No results found for: TSH Lab Results  Component Value Date   WBC 14.9 (H) 12/20/2018   HGB 15.3 12/21/2018   HCT 45.0 12/21/2018   MCV 92.7 12/20/2018   PLT 247 12/20/2018   Lab Results  Component Value Date   NA 139 12/21/2018   K 3.6 12/21/2018   CO2 23 12/20/2018   GLUCOSE 111 (H) 12/21/2018   BUN 10 12/21/2018   CREATININE  1.40 (H) 12/21/2018   BILITOT 0.3 12/20/2018   ALKPHOS 43 12/20/2018   AST 22 12/20/2018   ALT 19 12/20/2018   PROT 5.1 (L) 12/20/2018   ALBUMIN 3.1 (L) 12/20/2018   CALCIUM 8.5 (L) 12/20/2018   ANIONGAP 10 12/20/2018   No results found for: CHOL No results found for: HDL No results found for: LDLCALC No results found for: TRIG No results found for: [facility] Lab Results  Component Value Date   HGBA1C 5.2 01/31/2019   Assessment & Plan:   1. Hospital discharge follow-up  2. Encounter to establish care  3. Stroke-like symptoms  4. Numbness and tingling of both legs Intermittent signs and symptoms. He has not been able to follow up with Neurology as of yet because of financial issues. Patient is encouraged to apply for [facility] [employer] for co-pay assistance. We will contact Neurology for possible assistance.    5. Light headedness Intermittently.   6. Dizziness Intermittently.   7. Cocaine use Admits to occasional cocaine, alcohol, and tobacco use. Detailed discussion on why polysubstance use could be a possible cause of intermittent stroke-like symptoms. - POCT HgB A1C - Glucose (CBG) - PSA - TSH - Vitamin B12 - Vitamin D, 25-hydroxy  8. Tobacco use  9. Alcohol use  10. Polysubstance abuse  11.Healthcare maintenance  12. Follow up He will follow up in 1-2 months.   No orders of the defined types were placed in this encounter.   Orders Placed This Encounter  Procedures  . PSA  . TSH  . Vitamin B12  . Vitamin D, 25-hydroxy  . Urinalysis Dipstick  . POCT HgB A1C  . Glucose (CBG)    Referral Orders  No referral(s) requested today    Kathe Becton,  MSN, FNP-BC [facility] [address] [place], [place] [address] [phone]- fax  Problem List Items Addressed This Visit      Other   Cocaine use    Other Visit Diagnoses     Hospital discharge follow-up    -  Primary   Encounter to establish care       Numbness and tingling of both legs       Light headedness       Dizziness       Alcohol use       Tobacco use       Health care maintenance       Relevant Orders   Urinalysis Dipstick (Completed)   POCT HgB A1C (Completed)   Glucose (CBG) (Completed)   PSA   TSH   Vitamin B12   Vitamin D, 25-hydroxy   Follow up          No orders of the defined types were placed in this encounter.   Follow-up: No follow-ups on file.    Azzie Glatter, FNP

## 2019-02-01 ENCOUNTER — Encounter: Payer: Self-pay | Admitting: Family Medicine

## 2019-02-01 DIAGNOSIS — Z7289 Other problems related to lifestyle: Secondary | ICD-10-CM | POA: Insufficient documentation

## 2019-02-01 DIAGNOSIS — R42 Dizziness and giddiness: Secondary | ICD-10-CM | POA: Insufficient documentation

## 2019-02-01 DIAGNOSIS — R2990 Unspecified symptoms and signs involving the nervous system: Secondary | ICD-10-CM | POA: Insufficient documentation

## 2019-02-01 DIAGNOSIS — R200 Anesthesia of skin: Secondary | ICD-10-CM | POA: Insufficient documentation

## 2019-02-01 DIAGNOSIS — Z789 Other specified health status: Secondary | ICD-10-CM | POA: Insufficient documentation

## 2019-02-01 DIAGNOSIS — R202 Paresthesia of skin: Secondary | ICD-10-CM | POA: Insufficient documentation

## 2019-02-01 DIAGNOSIS — F1090 Alcohol use, unspecified, uncomplicated: Secondary | ICD-10-CM | POA: Insufficient documentation

## 2019-02-01 LAB — VITAMIN D 25 HYDROXY (VIT D DEFICIENCY, FRACTURES): Vit D, 25-Hydroxy: 7.2 ng/mL — ABNORMAL LOW (ref 30.0–100.0)

## 2019-02-01 LAB — TSH: TSH: 1.78 u[IU]/mL (ref 0.450–4.500)

## 2019-02-01 LAB — VITAMIN B12: Vitamin B-12: 361 pg/mL (ref 232–1245)

## 2019-02-01 LAB — PSA: Prostate Specific Ag, Serum: 1.4 ng/mL (ref 0.0–4.0)

## 2019-02-07 ENCOUNTER — Encounter: Payer: Self-pay | Admitting: Family Medicine

## 2019-02-07 ENCOUNTER — Other Ambulatory Visit: Payer: Self-pay | Admitting: Family Medicine

## 2019-02-07 DIAGNOSIS — E559 Vitamin D deficiency, unspecified: Secondary | ICD-10-CM

## 2019-02-07 MED ORDER — VITAMIN D (ERGOCALCIFEROL) 1.25 MG (50000 UNIT) PO CAPS: 50000.0000 [IU] | ORAL_CAPSULE | ORAL | 6 refills | Status: DC

## 2019-02-09 ENCOUNTER — Telehealth: Payer: Self-pay | Admitting: Family Medicine

## 2019-02-09 NOTE — Telephone Encounter (Signed)
Spoke with patient today to remind him to make follow up appointment with Neurologist. Patient informed me that he recently qualified for Medicaid and has upcomingTelephone Virtual Visit with disability.

## 2019-02-09 NOTE — Telephone Encounter (Signed)
-----   Message from Azzie Glatter, [place] sent at 02/01/2019  5:29 AM EST ----- Regarding: "Neurology Consult" Follow up with [facility] Neurologist..[name]

## 2019-03-30 ENCOUNTER — Other Ambulatory Visit: Payer: Self-pay | Admitting: Family Medicine

## 2019-03-30 ENCOUNTER — Other Ambulatory Visit: Payer: Self-pay | Admitting: Critical Care Medicine

## 2019-03-30 ENCOUNTER — Encounter: Payer: Self-pay | Admitting: Critical Care Medicine

## 2019-03-30 DIAGNOSIS — G3281 Cerebellar ataxia in diseases classified elsewhere: Secondary | ICD-10-CM

## 2019-03-30 DIAGNOSIS — E559 Vitamin D deficiency, unspecified: Secondary | ICD-10-CM

## 2019-03-30 DIAGNOSIS — R2990 Unspecified symptoms and signs involving the nervous system: Secondary | ICD-10-CM

## 2019-03-30 MED ORDER — ASPIRIN EC 81 MG PO TBEC: 81.0000 mg | DELAYED_RELEASE_TABLET | Freq: Every day | ORAL | 3 refills | Status: DC

## 2019-03-30 MED ORDER — CLOPIDOGREL BISULFATE 75 MG PO TABS: 75.0000 mg | ORAL_TABLET | Freq: Every day | ORAL | 3 refills | Status: DC

## 2019-03-30 MED ORDER — VITAMIN D (ERGOCALCIFEROL) 1.25 MG (50000 UNIT) PO CAPS: 50000.0000 [IU] | ORAL_CAPSULE | ORAL | 6 refills | Status: DC

## 2019-03-30 NOTE — Congregational Nurse Program (Signed)
  Donald York was seen by Dr. Joya Gaskins in clinic this afternoon for initial c/o of L sided weakness which presents as episodic events. Pt. has 2/8 appointment at [facility] Medication Rx will be sent to [facility] of which CN will pick up tomorrow and deliver.

## 2019-03-30 NOTE — Progress Notes (Signed)
meds for CVA prevention

## 2019-03-31 NOTE — Progress Notes (Signed)
Patient ID: Donald York, male   DOB: Jul 07, 1960, 59 y.o.   MRN: [id number] This is a 59 year old male who arrived at the [facility] in December and has had a history of TIAs and potential strokes in the past.  He noted during an ER visit in October the onset of left-sided numbness headaches nausea and weakness he went to the hospital at [facility] and underwent MRI of the brain which did show microangiopathic changes small vessel disease and a prior stroke in the cerebellum he did have a cerebral blood flow study done that was normal and CT of the head was unremarkable the patient appeared to clear his neurologic deficits and was sent home without any neurologic follow-up or other medications  The patient states over the last 2 days has had recurrence of the symptoms with more weakness on the left side over the weekend he had episodes while staying at the [facility] but did not go to the emergency room.  He does have a history of cocaine use last using this in December.  He was positive for cocaine on drug screen when he was in the emergency room in October.  He does smoke a pack a day of cigarettes.  The patient's problem list includes that of a history of duodenal ulcer, cocaine use and alcohol use, strokelike symptoms, and tobacco use  The patient works Architect during the day and we had not yet seen him in the [facility] clinic until now  On exam blood pressure 138/76 pulse 97 saturation 96% on room air  Neurologic exam was intact I did not determine any focality to his exam and no motor deficits were encountered  I reviewed the patient's MRI and I am very concerned about the fact that he has had previous cerebellar stroke and chronic ischemic microangiopathy  Plan is I spoke to the patient's primary care provider Kathe Becton and she will follow this patient up on February 8 and get a urgent neurology outpatient consult and as well I will begin the patient on Plavix 75 mg daily and  aspirin 81 mg daily I told the patient if he gets worse between now and when he sees his primary care provider he should go to the emergency room again

## 2019-04-04 ENCOUNTER — Ambulatory Visit (INDEPENDENT_AMBULATORY_CARE_PROVIDER_SITE_OTHER): Payer: Self-pay | Admitting: Family Medicine

## 2019-04-04 ENCOUNTER — Other Ambulatory Visit: Payer: Self-pay

## 2019-04-04 ENCOUNTER — Encounter: Payer: Self-pay | Admitting: Family Medicine

## 2019-04-04 ENCOUNTER — Ambulatory Visit (HOSPITAL_COMMUNITY)
Admission: RE | Admit: 2019-04-04 | Discharge: 2019-04-04 | Disposition: A | Discharge status: Home / Self Care | Payer: Self-pay | Source: Ambulatory Visit | Attending: Family Medicine | Admitting: Family Medicine

## 2019-04-04 VITALS — BP 113/60 | HR 70 | Temp 97.6°F | Ht 72.0 in | Wt 175.4 lb

## 2019-04-04 DIAGNOSIS — Z09 Encounter for follow-up examination after completed treatment for conditions other than malignant neoplasm: Secondary | ICD-10-CM

## 2019-04-04 DIAGNOSIS — R200 Anesthesia of skin: Secondary | ICD-10-CM

## 2019-04-04 DIAGNOSIS — R519 Headache, unspecified: Secondary | ICD-10-CM

## 2019-04-04 DIAGNOSIS — Z131 Encounter for screening for diabetes mellitus: Secondary | ICD-10-CM

## 2019-04-04 DIAGNOSIS — R0602 Shortness of breath: Secondary | ICD-10-CM

## 2019-04-04 DIAGNOSIS — G3281 Cerebellar ataxia in diseases classified elsewhere: Secondary | ICD-10-CM | POA: Insufficient documentation

## 2019-04-04 DIAGNOSIS — R202 Paresthesia of skin: Secondary | ICD-10-CM

## 2019-04-04 DIAGNOSIS — F1090 Alcohol use, unspecified, uncomplicated: Secondary | ICD-10-CM

## 2019-04-04 DIAGNOSIS — Z0000 Encounter for general adult medical examination without abnormal findings: Secondary | ICD-10-CM

## 2019-04-04 DIAGNOSIS — R42 Dizziness and giddiness: Secondary | ICD-10-CM

## 2019-04-04 DIAGNOSIS — Z7289 Other problems related to lifestyle: Secondary | ICD-10-CM

## 2019-04-04 DIAGNOSIS — R2990 Unspecified symptoms and signs involving the nervous system: Secondary | ICD-10-CM

## 2019-04-04 DIAGNOSIS — M316 Other giant cell arteritis: Secondary | ICD-10-CM

## 2019-04-04 DIAGNOSIS — Z8673 Personal history of transient ischemic attack (TIA), and cerebral infarction without residual deficits: Secondary | ICD-10-CM

## 2019-04-04 DIAGNOSIS — F1490 Cocaine use, unspecified, uncomplicated: Secondary | ICD-10-CM

## 2019-04-04 DIAGNOSIS — Z789 Other specified health status: Secondary | ICD-10-CM

## 2019-04-04 DIAGNOSIS — G8929 Other chronic pain: Secondary | ICD-10-CM

## 2019-04-04 LAB — POCT URINALYSIS DIPSTICK
Bilirubin, UA: NEGATIVE
Blood, UA: NEGATIVE
Glucose, UA: NEGATIVE
Ketones, UA: NEGATIVE
Leukocytes, UA: NEGATIVE
Nitrite, UA: NEGATIVE
Protein, UA: NEGATIVE
Spec Grav, UA: >=1.03 — AB (ref 1.010–1.025)
Urobilinogen, UA: 0.2 E.U./dL
pH, UA: 6 (ref 5.0–8.0)

## 2019-04-04 IMAGING — MR MR HEAD W/O CM
10 of 12 series · 34 of 48 positions shown · IV contrast (Yes)
Comparison: Brain MRI, CTA head and neck at the time of code stroke
presentation on [DATE].

CLINICAL DATA: 59-year-old male with recent increase in left side
weakness, ataxia.

EXAM:
MRI HEAD WITHOUT CONTRAST
TECHNIQUE: Multiplanar, multiecho pulse sequences of the brain and surrounding
structures were obtained without intravenous contrast.

[Series 3: DWI · axial · 3.0mm · 1.09mm/px · z∈[-26,+138]mm · 9 of 114 slices shown (1 of 4)]
[im 1/114]
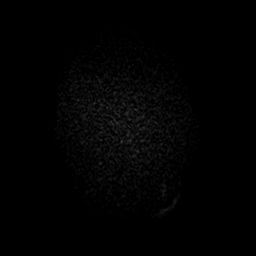
[im 15/114]
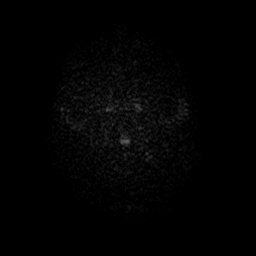
[im 29/114]
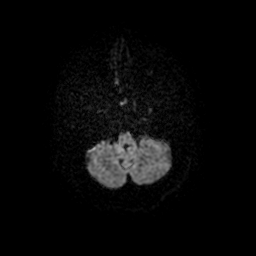
[im 43/114]
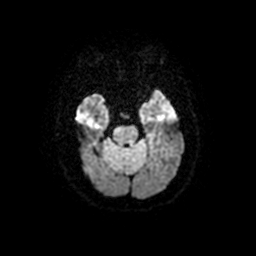
[im 57/114]
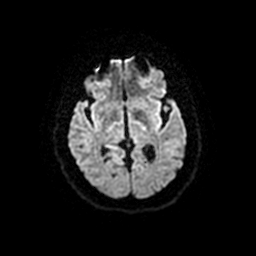
[im 71/114]
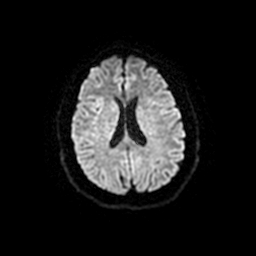
[im 85/114]
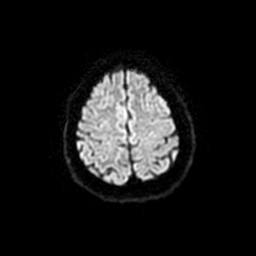
[im 99/114]
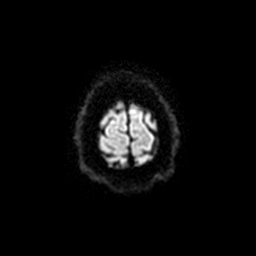
[im 114/114]
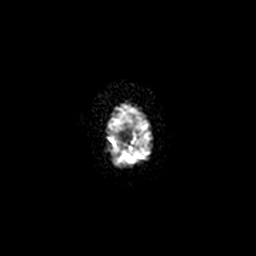

[Series 4: T1 · sagittal · 5.0mm · 0.47mm/px · 2 of 22 slices shown (1 of 2)]
[im 1/22]
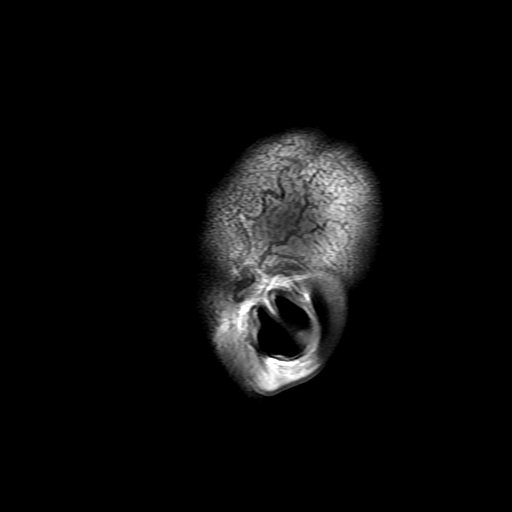
[im 22/22]
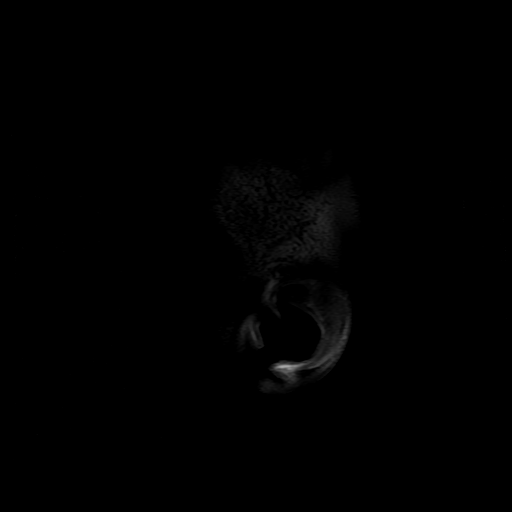

[Series 5: T2 · axial · 5.0mm · 0.43mm/px · z∈[-18,+133]mm · 2 of 23 slices shown (1 of 3)]
[im 1/23]
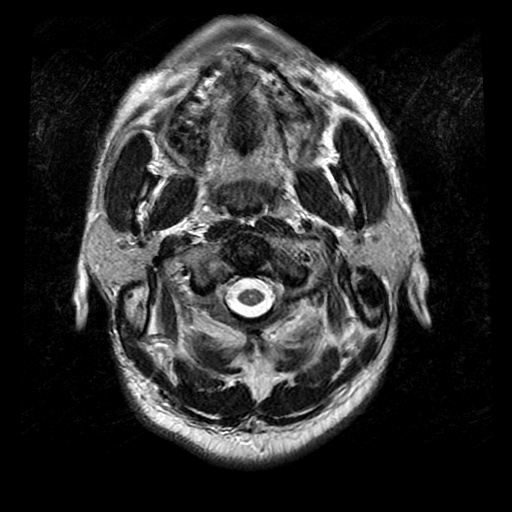
[im 23/23]
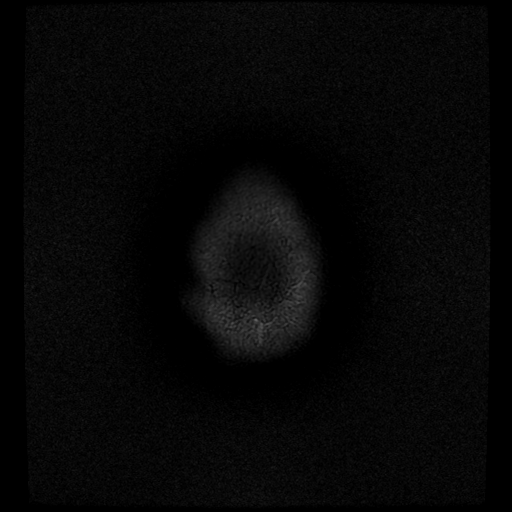

[Series 6: T2 · axial · 5.0mm · 0.43mm/px · z∈[-20,+133]mm · 2 of 27 slices shown (2 of 3)]
[im 1/27]
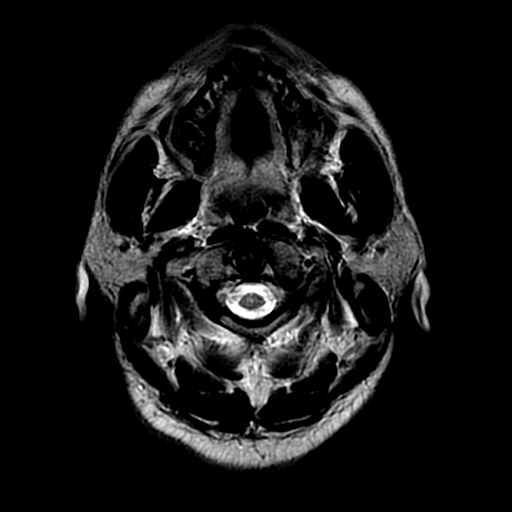
[im 27/27]
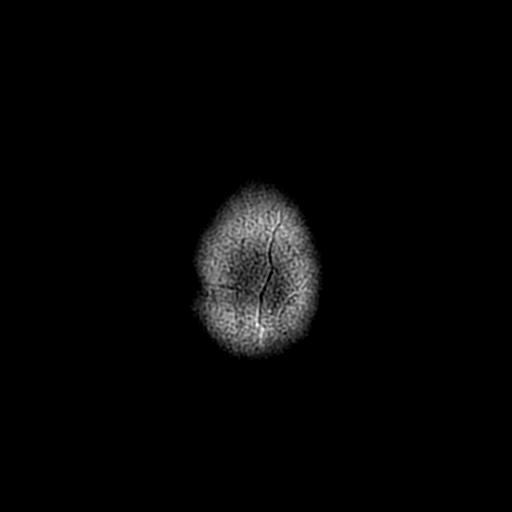

[Series 7: FLAIR · axial · 3.0mm · 0.43mm/px · z∈[-19,+134]mm · 2 of 27 slices shown]
[im 1/27]
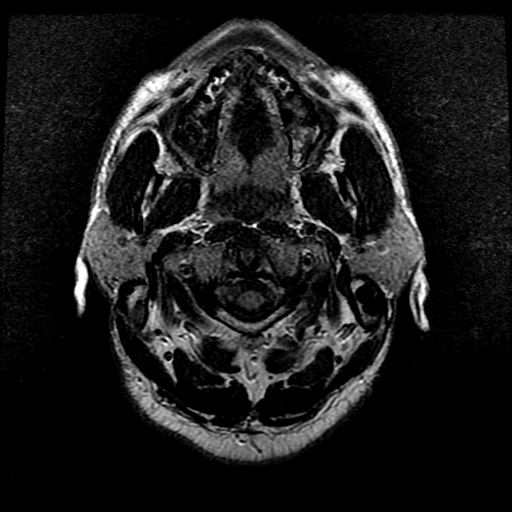
[im 27/27]
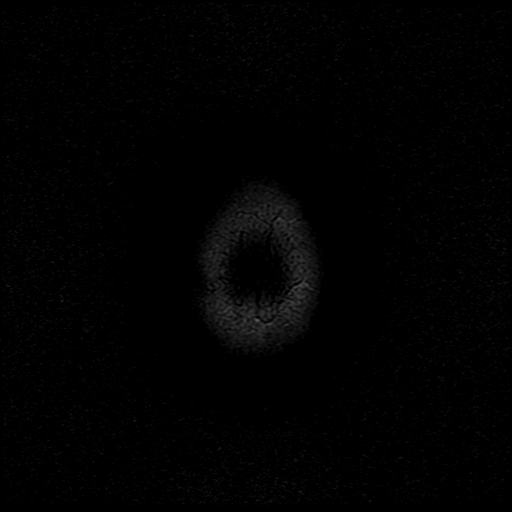

[Series 9: T1 · axial · 1.0mm · 0.47mm/px · z∈[-19,+9]mm · 2 of 164 slices shown (2 of 2)]
[im 1/164]
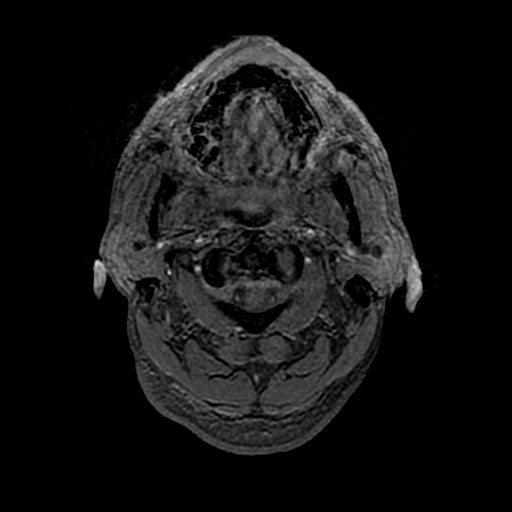
[im 30/164]
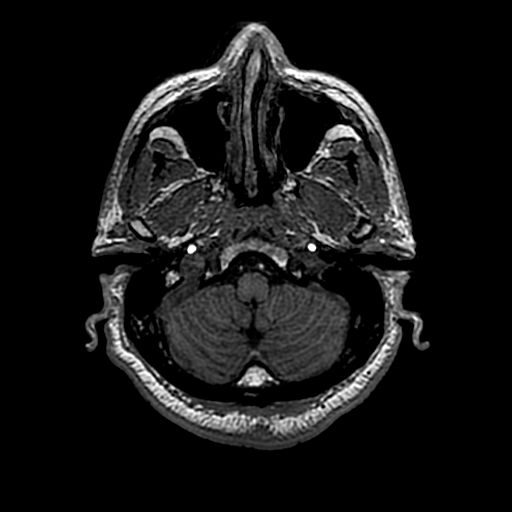

[Series 10: DWI · coronal · 4.0mm · 1.09mm/px · 6 of 86 slices shown (2 of 4)]
[im 1/86]
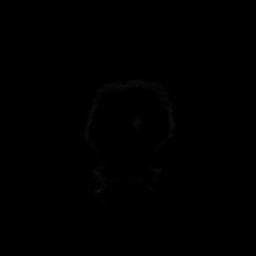
[im 18/86]
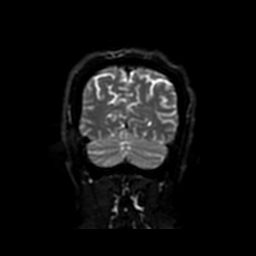
[im 35/86]
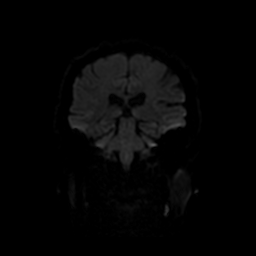
[im 52/86]
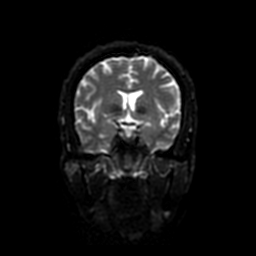
[im 69/86]
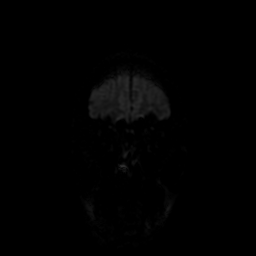
[im 86/86]
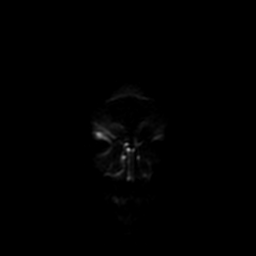

[Series 11: T2 · coronal · 5.0mm · 0.45mm/px · 2 of 26 slices shown (3 of 3)]
[im 1/26]
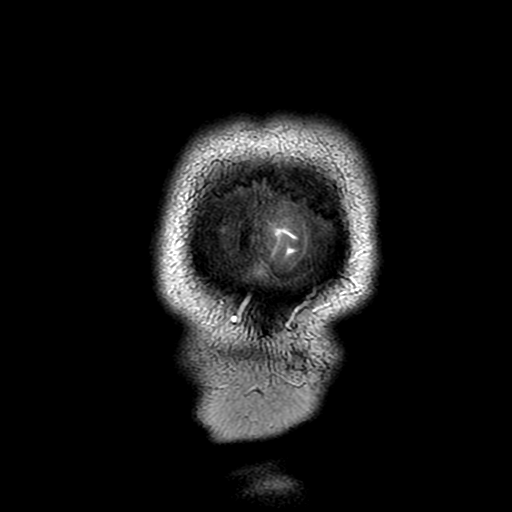
[im 26/26]
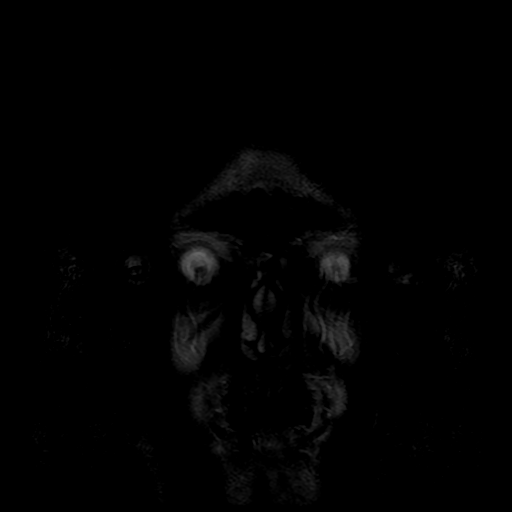

[Series 300: DWI · axial · 3.0mm · 1.09mm/px · z∈[-26,+138]mm · 4 of 57 slices shown (3 of 4)]
[im 1/57]
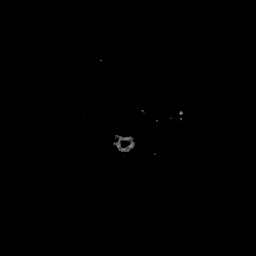
[im 19/57]
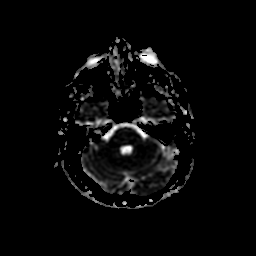
[im 38/57]
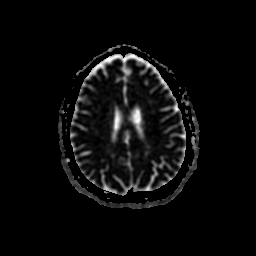
[im 57/57]
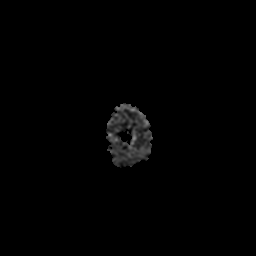

[Series 1000: DWI · coronal · 4.0mm · 1.09mm/px · 3 of 43 slices shown (4 of 4)]
[im 1/43]
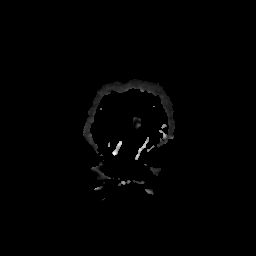
[im 22/43]
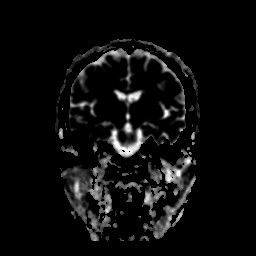
[im 43/43]
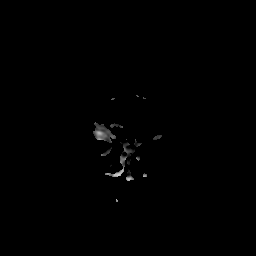

[34 of 48 positions shown; findings below may reference images not displayed]

FINDINGS: Brain: Solitary small focus of restricted diffusion suspected in the
dorsal pons although this is on the left (series 3, image 19). No
associated hemorrhage or mass effect.

No other restricted diffusion. No midline shift, mass effect,
evidence of mass lesion, ventriculomegaly, extra-axial collection or
acute intracranial hemorrhage. Cervicomedullary junction and
pituitary are within normal limits.

Patchy chronic T2 and FLAIR hyperintensity in the right cerebellum
near the peduncle. Scattered bilateral cerebral white matter T2 and
FLAIR hyperintensity, much of which resembles small chronic white
matter lacunar infarcts with punctate cystic encephalomalacia. No
cortical encephalomalacia or chronic cerebral blood products
identified. The deep gray nuclei remain spared with suspected mild
perivascular spaces.

Vascular: Major intracranial vascular flow voids are stable.

Skull and upper cervical spine: Partially visible C4-C5 cervical
disc and endplate degeneration. Visualized bone marrow signal is
within normal limits.

Sinuses/Orbits: Negative orbits. Paranasal sinuses and mastoids are
stable and well pneumatized.

Other: Visible internal auditory structures appear normal. Scalp and
face soft tissues appear negative.
IMPRESSION: 1. Solitary small acute lacunar infarct suspected in the dorsal left
pons. However, if symptomatic right side symptoms would be expected.
No associated hemorrhage or mass effect.
2. Otherwise stable since [REDACTED] underlying moderate for age signal
changes most compatible with chronic small vessel disease.

## 2019-04-04 NOTE — Progress Notes (Deleted)
NEUROLOGY CONSULTATION NOTE  Donald York MRN: [id number] DOB: [birth year]  Referring provider: Kathe Becton, FNP Primary care provider: Kathe Becton, FNP  Reason for consult:  Stroke-like symptoms  HISTORY OF PRESENT ILLNESS: Donald York is a 59 year old ***-handed African American male with who presents for stroke-like symptoms.  History supplemented by ED and referring provider's notes.  He presented to [facility] ED on 12/20/2018 as a code stroke after sudden onset of speech difficulty and inability to get of the couch.  EMS was called and they noted left sided upper extremity drift.  He was unable to answer their questions or follow commands.  CT and MRI of brain were personally reviewed and were negative for acute intracranial abnormalities.  Blood work demonstrated alcohol level of 42 and positive UDS for cocaine and marijuana.  It was noted that his neurologic exam was "inconsistent".  Symptoms improved and he did not receive tPA.  In-house neurology suspected encephalopathy due to substance abuse.  Following discharge, he continued to have episodes of lightheadedness, dizziness, disequilibrium, headache and now transient right-sided numbness and weakness.  He endorses increased anxiety.  Labs from 01/31/2019 showed B12 361, TSH 1.780 and very low vitamin D of 7.2.  ***  PAST MEDICAL HISTORY: Past Medical History:  Diagnosis Date  . Alcohol use   . Cocaine use   . Dizziness 11/2018  . Family history of adverse reaction to anesthesia    " [name] "  . Intermittent lightheadedness 11/2018  . Peri-rectal abscess 06/09/2016  . Stroke-like symptoms 11/2018  . Tobacco use   . Vitamin D deficiency 01/2019    PAST SURGICAL HISTORY: Past Surgical History:  Procedure Laterality Date  . NO PAST SURGERIES      MEDICATIONS: Current Outpatient Medications on File Prior to Visit  Medication Sig Dispense Refill  . aspirin EC 81 MG tablet Take 1 tablet (81 mg total) by mouth  daily. 60 tablet 3  . clopidogrel (PLAVIX) 75 MG tablet Take 1 tablet (75 mg total) by mouth daily. 90 tablet 3  . Vitamin D, Ergocalciferol, (DRISDOL) 1.25 MG (50000 UNIT) CAPS capsule Take 1 capsule (50,000 Units total) by mouth every 7 (seven) days. 5 capsule 6   No current facility-administered medications on file prior to visit.    ALLERGIES: No Known Allergies  FAMILY HISTORY: Family History  Problem Relation Age of Onset  . Cancer Maternal Uncle    ***.  SOCIAL HISTORY: Social History   Socioeconomic History  . Marital status: Single    Spouse name: Not on file  . Number of children: Not on file  . Years of education: Not on file  . Highest education level: Not on file  Occupational History  . Not on file  Tobacco Use  . Smoking status: Current Some Day Smoker    Packs/day: 0.50    Types: Cigarettes  . Smokeless tobacco: Never Used  Substance and Sexual Activity  . Alcohol use: Yes    Comment: 40 oz beer, every now and then  . Drug use: Yes    Types: Cocaine, Marijuana  . Sexual activity: Not on file  Other Topics Concern  . Not on file  Social History Narrative  . Not on file   Social Determinants of Health   Financial Resource Strain:   . Difficulty of Paying Living Expenses: Not on file  Food Insecurity:   . Worried About Charity fundraiser in the Last Year: Not on file  .  Ran Out of Food in the Last Year: Not on file  Transportation Needs:   . Lack of Transportation (Medical): Not on file  . Lack of Transportation (Non-Medical): Not on file  Physical Activity:   . Days of Exercise per Week: Not on file  . Minutes of Exercise per Session: Not on file  Stress:   . Feeling of Stress : Not on file  Social Connections:   . Frequency of Communication with Friends and Family: Not on file  . Frequency of Social Gatherings with Friends and Family: Not on file  . Attends Religious Services: Not on file  . Active Member of Clubs or Organizations: Not on  file  . Attends Archivist Meetings: Not on file  . Marital Status: Not on file  Intimate Partner Violence:   . Fear of Current or Ex-Partner: Not on file  . Emotionally Abused: Not on file  . Physically Abused: Not on file  . Sexually Abused: Not on file    REVIEW OF SYSTEMS: Constitutional: No fevers, chills, or sweats, no generalized fatigue, change in appetite Eyes: No visual changes, double vision, eye pain Ear, nose and throat: No hearing loss, ear pain, nasal congestion, sore throat Cardiovascular: No chest pain, palpitations Respiratory:  No shortness of breath at rest or with exertion, wheezes GastrointestinaI: No nausea, vomiting, diarrhea, abdominal pain, fecal incontinence Genitourinary:  No dysuria, urinary retention or frequency Musculoskeletal:  No neck pain, back pain Integumentary: No rash, pruritus, skin lesions Neurological: as above Psychiatric: No depression, insomnia, anxiety Endocrine: No palpitations, fatigue, diaphoresis, mood swings, change in appetite, change in weight, increased thirst Hematologic/Lymphatic:  No purpura, petechiae. Allergic/Immunologic: no itchy/runny eyes, nasal congestion, recent allergic reactions, rashes  PHYSICAL EXAM: *** General: No acute distress.  Patient appears ***-groomed.  *** Head:  Normocephalic/atraumatic Eyes:  fundi examined but not visualized Neck: supple, no paraspinal tenderness, full range of motion Back: No paraspinal tenderness Heart: regular rate and rhythm Lungs: Clear to auscultation bilaterally. Vascular: No carotid bruits. Neurological Exam: Mental status: alert and oriented to person, place, and time, recent and remote memory intact, fund of knowledge intact, attention and concentration intact, speech fluent and not dysarthric, language intact. Cranial nerves: CN I: not tested CN II: pupils equal, round and reactive to light, visual fields intact CN III, IV, VI:  full range of motion, no  nystagmus, no ptosis CN V: facial sensation intact CN VII: upper and lower face symmetric CN VIII: hearing intact CN IX, X: gag intact, uvula midline CN XI: sternocleidomastoid and trapezius muscles intact CN XII: tongue midline Bulk & Tone: normal, no fasciculations. Motor:  5/5 throughout *** Sensation:  Pinprick *** temperature *** and vibration sensation intact.  ***. Deep Tendon Reflexes:  2+ throughout, *** toes downgoing.  *** Finger to nose testing:  Without dysmetria.  *** Heel to shin:  Without dysmetria.  *** Gait:  Normal station and stride.  Able to turn and tandem walk. Romberg ***.  IMPRESSION: ***  PLAN: ***  Thank you for allowing me to take part in the care of this patient.  Metta Clines, DO  CC: ***

## 2019-04-04 NOTE — Progress Notes (Signed)
Patient [facility]   Established Patient Office Visit  Subjective:  Patient ID: Donald York, male    DOB: 11/13/1960  Age: 59 y.o. MRN: [id number]  CC:  Chief Complaint  Patient presents with  . Follow-up    Stroke-    HPI Donald York is a 59 year old male who presents for Sick Visit today.   Past Medical History:  Diagnosis Date  . Alcohol use   . Cocaine use   . Dizziness 11/2018  . Family history of adverse reaction to anesthesia    " [name] "  . Intermittent lightheadedness 11/2018  . Peri-rectal abscess 06/09/2016  . Stroke-like symptoms 11/2018  . Tobacco use   . Vitamin D deficiency 01/2019   Current Status: Since his last office visit, he is doing well with no complaints. Patient reports signs of 'stroke-like' symptoms X days. He completed MRI on 04/03/2019. He reports that he last drank two 40 oz alcoholic beverages on [id number]. He state that he only drinks every 'now and then.' He also has a recent history of Cocaine use on 04/01/2019. He states that he has not used cocaine in a 'while' since then. He states that he frequently will loose site in his left eye, then he will shortly experience increased sharp pain in his left temporal area.  He denies fevers, chills, fatigue, recent infections, weight loss, and night sweats. He has not had any headaches, visual changes, dizziness, and falls. No chest pain, heart palpitations, cough and shortness of breath reported. No reports of GI problems such as nausea, vomiting, diarrhea, and constipation. He has no reports of blood in stools, dysuria and hematuria. Moderate anxiety today r/t his healthcare status. He denies suicidal ideations, homicidal ideations, or auditory hallucinations. He denies pain today.    Past Surgical History:  Procedure Laterality Date  . NO PAST SURGERIES      Family History  Problem Relation Age of Onset  . Cancer Maternal Uncle     Social History    Socioeconomic History  . Marital status: Single    Spouse name: Not on file  . Number of children: Not on file  . Years of education: Not on file  . Highest education level: Not on file  Occupational History  . Not on file  Tobacco Use  . Smoking status: Current Some Day Smoker    Packs/day: 0.50    Types: Cigarettes  . Smokeless tobacco: Never Used  Substance and Sexual Activity  . Alcohol use: Yes    Comment: 40 oz beer, every now and then  . Drug use: Yes    Types: Cocaine, Marijuana  . Sexual activity: Not Currently  Other Topics Concern  . Not on file  Social History Narrative  . Not on file   Social Determinants of Health   Financial Resource Strain:   . Difficulty of Paying Living Expenses: Not on file  Food Insecurity:   . Worried About Charity fundraiser in the Last Year: Not on file  . Ran Out of Food in the Last Year: Not on file  Transportation Needs:   . Lack of Transportation (Medical): Not on file  . Lack of Transportation (Non-Medical): Not on file  Physical Activity:   . Days of Exercise per Week: Not on file  . Minutes of Exercise per Session: Not on file  Stress:   . Feeling of Stress : Not on file  Social Connections:   .  Frequency of Communication with Friends and Family: Not on file  . Frequency of Social Gatherings with Friends and Family: Not on file  . Attends Religious Services: Not on file  . Active Member of Clubs or Organizations: Not on file  . Attends Archivist Meetings: Not on file  . Marital Status: Not on file  Intimate Partner Violence:   . Fear of Current or Ex-Partner: Not on file  . Emotionally Abused: Not on file  . Physically Abused: Not on file  . Sexually Abused: Not on file    Outpatient Medications Prior to Visit  Medication Sig Dispense Refill  . aspirin EC 81 MG tablet Take 1 tablet (81 mg total) by mouth daily. 60 tablet 3  . clopidogrel (PLAVIX) 75 MG tablet Take 1 tablet (75 mg total) by mouth  daily. 90 tablet 3  . Vitamin D, Ergocalciferol, (DRISDOL) 1.25 MG (50000 UNIT) CAPS capsule Take 1 capsule (50,000 Units total) by mouth every 7 (seven) days. 5 capsule 6   No facility-administered medications prior to visit.    No Known Allergies  ROS Review of Systems  Constitutional: Negative.   HENT: Negative.   Eyes: Positive for visual disturbance (frontal-temporal headaches. ).  Respiratory: Positive for shortness of breath (occasional ).   Cardiovascular: Negative.   Gastrointestinal: Negative.   Endocrine: Negative.   Genitourinary: Negative.   Musculoskeletal: Positive for arthralgias (Generalized. ).  Skin: Negative.   Allergic/Immunologic: Negative.   Neurological: Positive for dizziness, syncope, weakness, light-headedness, numbness (right extremity) and headaches.  Hematological: Negative.   Psychiatric/Behavioral: The patient is nervous/anxious and is hyperactive.    Objective:    Physical Exam  Constitutional: He is oriented to person, place, and time. He appears well-developed and well-nourished.  HENT:  Head: Normocephalic and atraumatic.  Eyes: Conjunctivae are normal.  Cardiovascular: Normal rate, regular rhythm, normal heart sounds and intact distal pulses.  Pulmonary/Chest: Effort normal and breath sounds normal.  Abdominal: Soft. Bowel sounds are normal.  Musculoskeletal:     Cervical back: Normal range of motion and neck supple.     Comments: Weakness in right extremity.  Neurological: He is alert and oriented to person, place, and time. He has normal reflexes.  Skin: Skin is warm and dry.  Psychiatric: He has a normal mood and affect. His behavior is normal. Judgment and thought content normal.  Nursing note and vitals reviewed.   BP 113/60   Pulse 70   Temp 97.6 F (36.4 C) (Oral)   Ht 6' (1.829 m)   Wt 175 lb 6.4 oz (79.6 kg)   SpO2 100%   BMI 23.79 kg/m  Wt Readings from Last 3 Encounters:  04/04/19 175 lb 6.4 oz (79.6 kg)  01/31/19  167 lb (75.8 kg)  12/21/18 168 lb 10.4 oz (76.5 kg)     Health Maintenance Due  Topic Date Due  . Hepatitis C Screening  05/02/1960  . COLONOSCOPY  03/22/2010    There are no preventive care reminders to display for this patient.  Lab Results  Component Value Date   TSH 1.780 01/31/2019   Lab Results  Component Value Date   WBC 14.9 (H) 12/20/2018   HGB 15.3 12/21/2018   HCT 45.0 12/21/2018   MCV 92.7 12/20/2018   PLT 247 12/20/2018   Lab Results  Component Value Date   NA 139 12/21/2018   K 3.6 12/21/2018   CO2 23 12/20/2018   GLUCOSE 111 (H) 12/21/2018   BUN 10 12/21/2018  CREATININE 1.40 (H) 12/21/2018   BILITOT 0.3 12/20/2018   ALKPHOS 43 12/20/2018   AST 22 12/20/2018   ALT 19 12/20/2018   PROT 5.1 (L) 12/20/2018   ALBUMIN 3.1 (L) 12/20/2018   CALCIUM 8.5 (L) 12/20/2018   ANIONGAP 10 12/20/2018   No results found for: CHOL No results found for: HDL No results found for: LDLCALC No results found for: TRIG No results found for: [facility] Lab Results  Component Value Date   HGBA1C 5.2 01/31/2019    Assessment & Plan:   1. Stroke-like symptom  2. History of stroke  3. Temporal arteritis (HCC) Stable today, but reports frequent massive headaches with visual changes. He will follow up with Neurology in a few days.  4. Temporal pain  5. Chronic left-sided headaches  6. Numbness and tingling of right leg  7. Light headedness  8. Dizziness Stable today.   9. Shortness of breath Occasional   10. Cocaine use - Oxycodone/Oxymorphone, Urine  11. Alcohol use - Oxycodone/Oxymorphone, Urine  12. Screening for diabetes mellitus  13. Healthcare maintenance - POCT urinalysis dipstick  14. Follow up He will follow up in 1 month.  No orders of the defined types were placed in this encounter.  Orders Placed This Encounter  Procedures  . Oxycodone/Oxymorphone, Urine  . POCT urinalysis dipstick    Referral Orders  No referral(s) requested  today    Kathe Becton,  MSN, FNP-BC [address] [place], [place] [address] [phone]- fax   Problem List Items Addressed This Visit      Other   Alcohol use   Relevant Orders   Oxycodone/Oxymorphone, Urine   Cocaine use   Relevant Orders   Oxycodone/Oxymorphone, Urine   Dizziness   Light headedness   Numbness and tingling of both legs   Stroke-like symptom - Primary    Other Visit Diagnoses    History of stroke       Temporal arteritis ([place])       Temporal pain       Chronic left-sided headaches       Shortness of breath       Screening for diabetes mellitus       Healthcare maintenance       Relevant Orders   POCT urinalysis dipstick (Completed)   Follow up          No orders of the defined types were placed in this encounter.   Follow-up: Return in about 1 week (around 04/11/2019).    Azzie Glatter, FNP

## 2019-04-05 ENCOUNTER — Ambulatory Visit: Payer: Self-pay | Admitting: Neurology

## 2019-04-05 DIAGNOSIS — M316 Other giant cell arteritis: Secondary | ICD-10-CM | POA: Insufficient documentation

## 2019-04-05 DIAGNOSIS — R0602 Shortness of breath: Secondary | ICD-10-CM | POA: Insufficient documentation

## 2019-04-05 DIAGNOSIS — G8929 Other chronic pain: Secondary | ICD-10-CM | POA: Insufficient documentation

## 2019-04-05 DIAGNOSIS — Z8673 Personal history of transient ischemic attack (TIA), and cerebral infarction without residual deficits: Secondary | ICD-10-CM | POA: Insufficient documentation

## 2019-04-05 LAB — OXYCODONE/OXYMORPHONE, URINE: Oxycodone+Oxymorphone Ur Ql Scn: NEGATIVE ng/mL

## 2019-04-08 ENCOUNTER — Other Ambulatory Visit: Payer: Self-pay

## 2019-04-08 ENCOUNTER — Encounter: Payer: Self-pay | Admitting: Critical Care Medicine

## 2019-04-08 DIAGNOSIS — Z20822 Contact with and (suspected) exposure to covid-19: Secondary | ICD-10-CM

## 2019-04-10 LAB — NOVEL CORONAVIRUS, NAA: SARS-CoV-2, NAA: NOT DETECTED

## 2019-04-11 ENCOUNTER — Encounter: Payer: Self-pay | Admitting: Family Medicine

## 2019-04-11 ENCOUNTER — Other Ambulatory Visit: Payer: Self-pay

## 2019-04-11 ENCOUNTER — Ambulatory Visit (INDEPENDENT_AMBULATORY_CARE_PROVIDER_SITE_OTHER): Payer: Self-pay | Admitting: Family Medicine

## 2019-04-11 ENCOUNTER — Other Ambulatory Visit (HOSPITAL_COMMUNITY): Payer: Self-pay

## 2019-04-11 VITALS — BP 103/52 | HR 86 | Temp 98.3°F | Resp 16 | Ht 72.0 in | Wt 177.0 lb

## 2019-04-11 DIAGNOSIS — R0789 Other chest pain: Secondary | ICD-10-CM

## 2019-04-11 DIAGNOSIS — Z8673 Personal history of transient ischemic attack (TIA), and cerebral infarction without residual deficits: Secondary | ICD-10-CM

## 2019-04-11 DIAGNOSIS — Z09 Encounter for follow-up examination after completed treatment for conditions other than malignant neoplasm: Secondary | ICD-10-CM

## 2019-04-11 DIAGNOSIS — R519 Headache, unspecified: Secondary | ICD-10-CM

## 2019-04-11 DIAGNOSIS — G8929 Other chronic pain: Secondary | ICD-10-CM

## 2019-04-11 DIAGNOSIS — R0602 Shortness of breath: Secondary | ICD-10-CM

## 2019-04-11 NOTE — Progress Notes (Signed)
Patient [facility]   Established Patient Office Visit  Subjective:  Patient ID: Donald York, male    DOB: 1961/01/03  Age: 59 y.o. MRN: [id number]  CC:  Chief Complaint  Patient presents with  . Follow-up    1 week follow up     HPI Donald York is a 59 year old male who presents for Follow Up today.   Past Medical History:  Diagnosis Date  . Alcohol use   . Cocaine use   . CVA (cerebral vascular accident) ([place])   . Dizziness 11/2018  . Family history of adverse reaction to anesthesia    " [name] "  . Peri-rectal abscess 06/09/2016  . PUD (peptic ulcer disease)   . Tobacco use   . Vitamin D deficiency 01/2019   Current Status: Since his last office visit, he has c/o shortness of breath when talking and ambulating. He states that his symptoms have improved. He reports occasional left eye blurriness. He has not had any headaches, dizziness, and falls. He reports moderate chest discomfort today. No chest pain, and heart palpitations, cough reported. He denies fevers, chills, fatigue, recent infections, weight loss, and night sweats.  No reports of GI problems such as nausea, vomiting, diarrhea, and constipation. He has no reports of blood in stools, dysuria and hematuria. No depression or anxiety reported today. He denies suicidal ideations, homicidal ideations, or auditory hallucinations. He denies pain today.   Past Surgical History:  Procedure Laterality Date  . NO PAST SURGERIES      Family History  Problem Relation Age of Onset  . Lung cancer Father   . Cancer Maternal Uncle     Social History   Socioeconomic History  . Marital status: Single    Spouse name: Not on file  . Number of children: Not on file  . Years of education: Not on file  . Highest education level: Not on file  Occupational History    Comment: Construction  Tobacco Use  . Smoking status: Current Some Day Smoker    Packs/day: 0.50    Types:  Cigarettes  . Smokeless tobacco: Never Used  Substance and Sexual Activity  . Alcohol use: Yes    Comment: 40 oz beer, every now and then  . Drug use: Yes    Types: Cocaine, Marijuana  . Sexual activity: Not Currently  Other Topics Concern  . Not on file  Social History Narrative  . Not on file   Social Determinants of Health   Financial Resource Strain:   . Difficulty of Paying Living Expenses: Not on file  Food Insecurity:   . Worried About Charity fundraiser in the Last Year: Not on file  . Ran Out of Food in the Last Year: Not on file  Transportation Needs:   . Lack of Transportation (Medical): Not on file  . Lack of Transportation (Non-Medical): Not on file  Physical Activity:   . Days of Exercise per Week: Not on file  . Minutes of Exercise per Session: Not on file  Stress:   . Feeling of Stress : Not on file  Social Connections:   . Frequency of Communication with Friends and Family: Not on file  . Frequency of Social Gatherings with Friends and Family: Not on file  . Attends Religious Services: Not on file  . Active Member of Clubs or Organizations: Not on file  . Attends Archivist Meetings: Not on file  .  Marital Status: Not on file  Intimate Partner Violence:   . Fear of Current or Ex-Partner: Not on file  . Emotionally Abused: Not on file  . Physically Abused: Not on file  . Sexually Abused: Not on file    Outpatient Medications Prior to Visit  Medication Sig Dispense Refill  . aspirin EC 81 MG tablet Take 1 tablet (81 mg total) by mouth daily. 60 tablet 3  . clopidogrel (PLAVIX) 75 MG tablet Take 1 tablet (75 mg total) by mouth daily. 90 tablet 3  . VITAMIN D PO Take by mouth.    . Vitamin D, Ergocalciferol, (DRISDOL) 1.25 MG (50000 UNIT) CAPS capsule Take 1 capsule (50,000 Units total) by mouth every 7 (seven) days. (Patient not taking: Reported on 04/11/2019) 5 capsule 6   No facility-administered medications prior to visit.    No Known  Allergies  ROS Review of Systems  Constitutional: Negative.   HENT: Negative.   Eyes: Negative.   Respiratory: Negative.   Cardiovascular: Negative.   Gastrointestinal: Negative.   Endocrine: Negative.   Genitourinary: Negative.   Musculoskeletal: Negative.   Skin: Negative.   Allergic/Immunologic: Negative.   Neurological: Positive for dizziness (occasional ) and headaches (occasional ).  Hematological: Negative.   Psychiatric/Behavioral: Negative.       Objective:    Physical Exam  Constitutional: He is oriented to person, place, and time. He appears well-developed and well-nourished.  HENT:  Head: Normocephalic and atraumatic.  Eyes: Conjunctivae are normal.  Cardiovascular: Normal rate, regular rhythm, normal heart sounds and intact distal pulses.  Pulmonary/Chest: Effort normal and breath sounds normal.  Abdominal: Bowel sounds are normal.  Musculoskeletal:        General: Normal range of motion.     Cervical back: Normal range of motion and neck supple.  Neurological: He is alert and oriented to person, place, and time.  Skin: Skin is warm and dry.  Psychiatric: He has a normal mood and affect. His behavior is normal. Judgment and thought content normal.  Nursing note and vitals reviewed.   BP (!) 103/52 (BP Location: Right Arm, Patient Position: Sitting, Cuff Size: Normal)   Pulse 86   Temp 98.3 F (36.8 C) (Oral)   Resp 16   Ht 6' (1.829 m)   Wt 177 lb (80.3 kg)   SpO2 100%   BMI 24.01 kg/m  Wt Readings from Last 3 Encounters:  04/12/19 174 lb 12.8 oz (79.3 kg)  04/11/19 177 lb (80.3 kg)  04/04/19 175 lb 6.4 oz (79.6 kg)     Health Maintenance Due  Topic Date Due  . Hepatitis C Screening  1960-07-02  . COLONOSCOPY  03/22/2010    There are no preventive care reminders to display for this patient.  Lab Results  Component Value Date   TSH 1.780 01/31/2019   Lab Results  Component Value Date   WBC 14.9 (H) 12/20/2018   HGB 15.3 12/21/2018    HCT 45.0 12/21/2018   MCV 92.7 12/20/2018   PLT 247 12/20/2018   Lab Results  Component Value Date   NA 139 12/21/2018   K 3.6 12/21/2018   CO2 23 12/20/2018   GLUCOSE 111 (H) 12/21/2018   BUN 10 12/21/2018   CREATININE 1.40 (H) 12/21/2018   BILITOT 0.3 12/20/2018   ALKPHOS 43 12/20/2018   AST 22 12/20/2018   ALT 19 12/20/2018   PROT 5.1 (L) 12/20/2018   ALBUMIN 3.1 (L) 12/20/2018   CALCIUM 8.5 (L) 12/20/2018   ANIONGAP  10 12/20/2018   No results found for: CHOL No results found for: HDL No results found for: LDLCALC No results found for: TRIG No results found for: [facility] Lab Results  Component Value Date   HGBA1C 5.2 01/31/2019      Assessment & Plan:   1. Chest discomfort  2. Shortness of breath Moderated today.  - EKG 12-Lead - Ambulatory referral to Cardiology  3. History of stroke Missed appointment last week with neurologist. He will keep follow up appointment with Neurology.   4. Chronic left-sided headaches Improved.   5. Follow up He will follow up in 1 month.   No orders of the defined types were placed in this encounter.   Orders Placed This Encounter  Procedures  . Ambulatory referral to Cardiology  . EKG 12-Lead     Referral Orders     Ambulatory referral to Cardiology   Kathe Becton,  MSN, FNP-BC [place] [address] [place], [place] [address] [phone]- fax   Problem List Items Addressed This Visit      Other   Chronic left-sided headaches   History of stroke   Relevant Orders   Ambulatory referral to Cardiology   Shortness of breath   Relevant Orders   EKG 12-Lead   Ambulatory referral to Cardiology    Other Visit Diagnoses    Chest discomfort    -  Primary   Follow up          No orders of the defined types were placed in this encounter.   Follow-up: Return in about 1 month (around 05/09/2019).    Azzie Glatter,  FNP

## 2019-04-11 NOTE — Progress Notes (Signed)
[facility] Referral Note  Reason for Referral: Donald York is a 59 y.o. male  Pt was referred by NP, Kathe Becton for: transportation   Pt reports the following concerns: no transportation, homelessness, assistance applying for Medicaid and disability  Assessment: Patient is staying at [facility] and does not have transportation, walked to appointment today. Patient does not have Medicaid, so ineligible for Medicaid transportation. Patient also reported he would like some assistance with Medicaid application. CSW advised if patient unable to work, should apply for social security disability concurrently.   Plan: 1. Addressed today: Enrolled patient in [facility] services and assisted with ride back to shelter today. Provided contact information and advised patient to call for assistance with the Medicaid and disability  applications over the phone.  2. Referral: [employer], Social Security Administration  3. Follow up: available by phone for assistance with benefits applications  [name], [employer] [phone]

## 2019-04-12 ENCOUNTER — Encounter: Payer: Self-pay | Admitting: Cardiology

## 2019-04-12 ENCOUNTER — Ambulatory Visit (INDEPENDENT_AMBULATORY_CARE_PROVIDER_SITE_OTHER): Payer: Self-pay | Admitting: Cardiology

## 2019-04-12 ENCOUNTER — Ambulatory Visit: Payer: Self-pay | Admitting: Family Medicine

## 2019-04-12 ENCOUNTER — Telehealth: Payer: Self-pay | Admitting: Clinical

## 2019-04-12 VITALS — BP 98/60 | HR 88 | Temp 97.2°F | Ht 72.0 in | Wt 174.8 lb

## 2019-04-12 DIAGNOSIS — R9431 Abnormal electrocardiogram [ECG] [EKG]: Secondary | ICD-10-CM

## 2019-04-12 DIAGNOSIS — Z720 Tobacco use: Secondary | ICD-10-CM

## 2019-04-12 DIAGNOSIS — R0789 Other chest pain: Secondary | ICD-10-CM | POA: Insufficient documentation

## 2019-04-12 DIAGNOSIS — R0602 Shortness of breath: Secondary | ICD-10-CM

## 2019-04-12 NOTE — Progress Notes (Signed)
Referring-Natalie Clovis Riley, FNP Reason for referral-dyspnea and abnormal ECG  HPI: 59 year old male for evaluation of dyspnea and abnormal ECG at request of Kathe Becton, FNP.  Patient with history of CVA.  CTA October 2020 showed no large vessel occlusion or high-grade stenosis.  Patient has no dyspnea with routine activities but does note dyspnea with more vigorous things such as climbing stairs.  No orthopnea, PND, pedal edema, palpitations or syncope.  He has an occasional sharp pain in his chest for 1 to 2 seconds but no exertional chest pain.  Electrocardiogram felt to possibly represent septal infarct and cardiology asked to evaluate.  Current Outpatient Medications  Medication Sig Dispense Refill  . aspirin EC 81 MG tablet Take 1 tablet (81 mg total) by mouth daily. 60 tablet 3  . clopidogrel (PLAVIX) 75 MG tablet Take 1 tablet (75 mg total) by mouth daily. 90 tablet 3  . VITAMIN D PO Take by mouth.     No current facility-administered medications for this visit.    No Known Allergies   Past Medical History:  Diagnosis Date  . Alcohol use   . Cocaine use   . CVA (cerebral vascular accident) ([place])   . Dizziness 11/2018  . Family history of adverse reaction to anesthesia    " [name] "  . Peri-rectal abscess 06/09/2016  . PUD (peptic ulcer disease)   . Tobacco use   . Vitamin D deficiency 01/2019    Past Surgical History:  Procedure Laterality Date  . NO PAST SURGERIES      Social History   Socioeconomic History  . Marital status: Single    Spouse name: Not on file  . Number of children: Not on file  . Years of education: Not on file  . Highest education level: Not on file  Occupational History    Comment: Construction  Tobacco Use  . Smoking status: Current Some Day Smoker    Packs/day: 0.50    Types: Cigarettes  . Smokeless tobacco: Never Used  Substance and Sexual Activity  . Alcohol use: Yes    Comment: 40 oz beer, every now and then  . Drug  use: Yes    Types: Cocaine, Marijuana  . Sexual activity: Not Currently  Other Topics Concern  . Not on file  Social History Narrative  . Not on file   Social Determinants of Health   Financial Resource Strain:   . Difficulty of Paying Living Expenses: Not on file  Food Insecurity:   . Worried About Charity fundraiser in the Last Year: Not on file  . Ran Out of Food in the Last Year: Not on file  Transportation Needs:   . Lack of Transportation (Medical): Not on file  . Lack of Transportation (Non-Medical): Not on file  Physical Activity:   . Days of Exercise per Week: Not on file  . Minutes of Exercise per Session: Not on file  Stress:   . Feeling of Stress : Not on file  Social Connections:   . Frequency of Communication with Friends and Family: Not on file  . Frequency of Social Gatherings with Friends and Family: Not on file  . Attends Religious Services: Not on file  . Active Member of Clubs or Organizations: Not on file  . Attends Archivist Meetings: Not on file  . Marital Status: Not on file  Intimate Partner Violence:   . Fear of Current or Ex-Partner: Not on file  . Emotionally Abused:  Not on file  . Physically Abused: Not on file  . Sexually Abused: Not on file    Family History  Problem Relation Age of Onset  . Lung cancer Father   . Cancer Maternal Uncle     ROS: no fevers or chills, productive cough, hemoptysis, dysphasia, odynophagia, melena, hematochezia, dysuria, hematuria, rash, seizure activity, orthopnea, PND, pedal edema, claudication. Remaining systems are negative.  Physical Exam:   Blood pressure 98/60, pulse 88, temperature (!) 97.2 F (36.2 C), height 6' (1.829 m), weight 174 lb 12.8 oz (79.3 kg), SpO2 99 %.  General:  Well developed/well nourished in NAD Skin warm/dry Patient not depressed No peripheral clubbing Back-normal HEENT-normal/normal eyelids Neck supple/normal carotid upstroke bilaterally; no bruits; no JVD; no  thyromegaly chest - CTA/ normal expansion CV - RRR/normal S1 and S2; no rubs or gallops;  PMI nondisplaced, 1/6 systolic murmur radiating to carotids. Abdomen -NT/ND, no HSM, no mass, + bowel sounds, no bruit 2+ femoral pulses, no bruits Ext-no edema, chords, 2+ DP Neuro-grossly nonfocal  ECG -April 11, 2019-sinus rhythm, cannot rule out septal infarct, early repolarization.  Personally reviewed  A/P  1 dyspnea-not volume overloaded on examination.  Can perform most activities without having dyspnea.  We will arrange an echocardiogram to assess LV function.  If normal no further evaluation.  He is not having exertional chest pain.  2 abnormal ECG-possible septal infarct on electrocardiogram though also could be lead placement.  We will arrange an echocardiogram to assess wall motion.  3 history of cocaine abuse-patient counseled on avoiding.  4 tobacco abuse-patient counseled on discontinuing.  Kirk Ruths, MD

## 2019-04-12 NOTE — Patient Instructions (Signed)
Medication Instructions:  NO CHANGE *If you need a refill on your cardiac medications before your next appointment, please call your pharmacy*  Lab Work: If you have labs (blood work) drawn today and your tests are completely normal, you will receive your results only by: [name] MyChart Message (if you have MyChart) OR . A paper copy in the mail If you have any lab test that is abnormal or we need to change your treatment, we will call you to review the results.  Testing/Procedures: Your physician has requested that you have an echocardiogram. Echocardiography is a painless test that uses sound waves to create images of your heart. It provides your doctor with information about the size and shape of your heart and how well your heart's chambers and valves are working. This procedure takes approximately one hour. There are no restrictions for this procedure.[name]    Follow-Up: At [facility], you and your health needs are our priority.  As part of our continuing mission to provide you with exceptional heart care, we have created designated Provider Care Teams.  These Care Teams include your primary Cardiologist (physician) and Advanced Practice Providers (APPs -  Physician Assistants and Nurse Practitioners) who all work together to provide you with the care you need, when you need it.  Your next appointment:   AS NEEDED PENDING TEST RESULTS

## 2019-04-12 NOTE — Telephone Encounter (Signed)
Integrated Behavioral Health Note  Reason for Referral: [name] is a 59 y.o. male  Pt was referred by self for: assistance with Medicaid application   Pt reports the following concerns: in need of health coverage  Plan: 1. Addressed today: Patient called for assistance in completing Medicaid application. Completed Medicaid application over the phone with patient. Patient to come to office to review and sign application and CSW will assist in mailing the application to social services office. Due to health conditions and patient's report of decreased ability to work, advised patient also apply for social security disability.   2. Referral: Department of Health and Human Services  3. Follow up: as needed  [name], [facility] [phone]

## 2019-04-26 ENCOUNTER — Telehealth: Payer: Self-pay | Admitting: Family Medicine

## 2019-04-26 ENCOUNTER — Ambulatory Visit (HOSPITAL_COMMUNITY): Payer: Self-pay | Attending: Internal Medicine

## 2019-04-26 ENCOUNTER — Other Ambulatory Visit: Payer: Self-pay

## 2019-04-26 DIAGNOSIS — R0602 Shortness of breath: Secondary | ICD-10-CM | POA: Insufficient documentation

## 2019-04-26 NOTE — Telephone Encounter (Signed)
Pt was called and reminded of there appointment 

## 2019-04-27 ENCOUNTER — Encounter: Payer: Self-pay | Admitting: Family Medicine

## 2019-04-27 ENCOUNTER — Encounter: Payer: Self-pay | Admitting: *Deleted

## 2019-04-27 ENCOUNTER — Ambulatory Visit (INDEPENDENT_AMBULATORY_CARE_PROVIDER_SITE_OTHER): Payer: Self-pay | Admitting: Family Medicine

## 2019-04-27 VITALS — BP 111/60 | HR 77 | Temp 97.8°F | Ht 72.0 in | Wt 172.6 lb

## 2019-04-27 DIAGNOSIS — Z09 Encounter for follow-up examination after completed treatment for conditions other than malignant neoplasm: Secondary | ICD-10-CM

## 2019-04-27 DIAGNOSIS — Z8673 Personal history of transient ischemic attack (TIA), and cerebral infarction without residual deficits: Secondary | ICD-10-CM

## 2019-04-27 DIAGNOSIS — R0602 Shortness of breath: Secondary | ICD-10-CM

## 2019-04-27 DIAGNOSIS — R2990 Unspecified symptoms and signs involving the nervous system: Secondary | ICD-10-CM

## 2019-04-27 DIAGNOSIS — R42 Dizziness and giddiness: Secondary | ICD-10-CM

## 2019-04-27 DIAGNOSIS — M79604 Pain in right leg: Secondary | ICD-10-CM

## 2019-04-27 MED ORDER — NAPROXEN 500 MG PO TABS: 500.0000 mg | ORAL_TABLET | Freq: Two times a day (BID) | ORAL | 3 refills | Status: DC

## 2019-04-27 NOTE — Progress Notes (Signed)
[facility]    Established Patient Office Visit  Subjective:  Patient ID: Donald York, male    DOB: 09-Dec-1960  Age: 59 y.o. MRN: [id number]  CC:  Chief Complaint  Patient presents with  . Follow-up  . Hip Pain    right hip pain  . Leg Pain    right leg pain with numbness    HPI SABRIEL York is a 59 year old male who presents for Follow Up today.  Past Medical History:  Diagnosis Date  . Alcohol use   . Cocaine use   . CVA (cerebral vascular accident) ([place])   . Dizziness 11/2018  . Family history of adverse reaction to anesthesia    " [name] "  . Peri-rectal abscess 06/09/2016  . PUD (peptic ulcer disease)   . Tobacco use   . Vitamin D deficiency 01/2019   Current Status: Since his last office visit, he has followed up with Neurology on 04/26/2019. He continues to have occasional symptoms of shortness of breath,  dizziness, and occasional headaches. He continues to decrease smoking habit to about 4 cigarettes a day. Recent Echo performed on 04/26/2019.He has c/o right leg pain, which he is currently not taking any medication for leg pain. He continues to live at [facility] until he can get more stabilized. He has followed up with our clinic social worker, Manuela Schwartz at his last office visit. He is not eligible for transportation, because he currently does not have Medicaid. He has completed application process for probable approval. He has also applied for Social Security. He denies fevers, chills, fatigue, recent infections, weight loss, and night sweats. He has not had any headaches, visual changes, dizziness, and falls. No chest pain, heart palpitations, and cough reported. No reports of GI problems such as nausea, vomiting, diarrhea, and constipation. He has no reports of blood in stools, dysuria and hematuria. No depression or anxiety reported today. He denies suicidal ideations, homicidal ideations, or auditory  hallucinations. He denies pain today.   Past Surgical History:  Procedure Laterality Date  . NO PAST SURGERIES      Family History  Problem Relation Age of Onset  . Lung cancer Father   . Cancer Maternal Uncle     Social History   Socioeconomic History  . Marital status: Single    Spouse name: Not on file  . Number of children: Not on file  . Years of education: Not on file  . Highest education level: Not on file  Occupational History    Comment: Construction  Tobacco Use  . Smoking status: Current Some Day Smoker    Packs/day: 0.50    Types: Cigarettes  . Smokeless tobacco: Never Used  Substance and Sexual Activity  . Alcohol use: Yes    Comment: 40 oz beer, every now and then  . Drug use: Yes    Types: Cocaine, Marijuana  . Sexual activity: Not Currently  Other Topics Concern  . Not on file  Social History Narrative  . Not on file   Social Determinants of Health   Financial Resource Strain:   . Difficulty of Paying Living Expenses: Not on file  Food Insecurity:   . Worried About Charity fundraiser in the Last Year: Not on file  . Ran Out of Food in the Last Year: Not on file  Transportation Needs:   . Lack of Transportation (Medical): Not on file  . Lack of  Transportation (Non-Medical): Not on file  Physical Activity:   . Days of Exercise per Week: Not on file  . Minutes of Exercise per Session: Not on file  Stress:   . Feeling of Stress : Not on file  Social Connections:   . Frequency of Communication with Friends and Family: Not on file  . Frequency of Social Gatherings with Friends and Family: Not on file  . Attends Religious Services: Not on file  . Active Member of Clubs or Organizations: Not on file  . Attends Archivist Meetings: Not on file  . Marital Status: Not on file  Intimate Partner Violence:   . Fear of Current or Ex-Partner: Not on file  . Emotionally Abused: Not on file  . Physically Abused: Not on file  . Sexually Abused:  Not on file    Outpatient Medications Prior to Visit  Medication Sig Dispense Refill  . clopidogrel (PLAVIX) 75 MG tablet Take 1 tablet (75 mg total) by mouth daily. 90 tablet 3  . VITAMIN D PO Take by mouth.    [name] aspirin EC 81 MG tablet Take 1 tablet (81 mg total) by mouth daily. (Patient not taking: Reported on 04/27/2019) 60 tablet 3   No facility-administered medications prior to visit.    No Known Allergies  ROS Review of Systems  Constitutional: Negative.   HENT: Negative.   Eyes: Negative.   Respiratory: Positive for shortness of breath (occasional).   Cardiovascular: Negative.   Gastrointestinal: Negative.   Endocrine: Negative.   Genitourinary: Negative.   Musculoskeletal: Positive for arthralgias (generalized joint pain ).  Skin: Negative.   Allergic/Immunologic: Negative.   Neurological: Positive for dizziness (occasional ), weakness (occasional ), numbness (occasional ) and headaches (occasional ).  Hematological: Negative.   Psychiatric/Behavioral: Negative.       Objective:    Physical Exam  Constitutional: He is oriented to person, place, and time. He appears well-developed and well-nourished.  HENT:  Head: Normocephalic and atraumatic.  Eyes: Conjunctivae are normal.  Cardiovascular: Normal rate, regular rhythm, normal heart sounds and intact distal pulses.  Pulmonary/Chest: Effort normal and breath sounds normal.  Abdominal: Soft. Bowel sounds are normal.  Musculoskeletal:        General: Normal range of motion.     Cervical back: Normal range of motion and neck supple.  Neurological: He is alert and oriented to person, place, and time. He has normal reflexes.  Skin: Skin is warm and dry.  Psychiatric: He has a normal mood and affect. His behavior is normal. Judgment and thought content normal.  Nursing note and vitals reviewed.   BP 111/60   Pulse 77   Temp 97.8 F (36.6 C) (Oral)   Ht 6' (1.829 m)   Wt 172 lb 9.6 oz (78.3 kg)   SpO2 97%    BMI 23.41 kg/m  Wt Readings from Last 3 Encounters:  04/27/19 172 lb 9.6 oz (78.3 kg)  04/12/19 174 lb 12.8 oz (79.3 kg)  04/11/19 177 lb (80.3 kg)     Health Maintenance Due  Topic Date Due  . Hepatitis C Screening  1960/09/11  . COLONOSCOPY  03/22/2010    There are no preventive care reminders to display for this patient.  Lab Results  Component Value Date   TSH 1.780 01/31/2019   Lab Results  Component Value Date   WBC 14.9 (H) 12/20/2018   HGB 15.3 12/21/2018   HCT 45.0 12/21/2018   MCV 92.7 12/20/2018   PLT 247  12/20/2018   Lab Results  Component Value Date   NA 139 12/21/2018   K 3.6 12/21/2018   CO2 23 12/20/2018   GLUCOSE 111 (H) 12/21/2018   BUN 10 12/21/2018   CREATININE 1.40 (H) 12/21/2018   BILITOT 0.3 12/20/2018   ALKPHOS 43 12/20/2018   AST 22 12/20/2018   ALT 19 12/20/2018   PROT 5.1 (L) 12/20/2018   ALBUMIN 3.1 (L) 12/20/2018   CALCIUM 8.5 (L) 12/20/2018   ANIONGAP 10 12/20/2018   No results found for: CHOL No results found for: HDL No results found for: LDLCALC No results found for: TRIG No results found for: [facility] Lab Results  Component Value Date   HGBA1C 5.2 01/31/2019      Assessment & Plan:   1. History of stroke  2. Stroke-like symptom Stable. No signs or symptoms of recurrence noted or reported.   3. Shortness of breath Occasional upon exertion.   4. Dizziness Stable.   5. Right leg pain We will initiate pain medication today.  - naproxen (NAPROSYN) 500 MG tablet; Take 1 tablet (500 mg total) by mouth 2 (two) times daily with a meal. For leg pain  Dispense: 60 tablet; Refill: 3  6. Follow up He will follow up in 1 month.  Meds ordered this encounter  Medications  . naproxen (NAPROSYN) 500 MG tablet    Sig: Take 1 tablet (500 mg total) by mouth 2 (two) times daily with a meal. For leg pain    Dispense:  60 tablet    Refill:  3    No orders of the defined types were placed in this encounter.   Referral  Orders  No referral(s) requested today    Kathe Becton,  MSN, FNP-BC [address] [place], [place] [address] [phone]- fax    Problem List Items Addressed This Visit      Other   Dizziness   History of stroke - Primary   Shortness of breath   Stroke-like symptom    Other Visit Diagnoses    Right leg pain       Relevant Medications   naproxen (NAPROSYN) 500 MG tablet   Follow up          Meds ordered this encounter  Medications  . naproxen (NAPROSYN) 500 MG tablet    Sig: Take 1 tablet (500 mg total) by mouth 2 (two) times daily with a meal. For leg pain    Dispense:  60 tablet    Refill:  3    Follow-up: Return in about 1 month (around 05/28/2019).    Azzie Glatter, FNP

## 2019-04-27 NOTE — Patient Instructions (Signed)
Naproxen and naproxen sodium oral immediate-release tablets What is this medicine? NAPROXEN (na PROX en) is a non-steroidal anti-inflammatory drug (NSAID). It is used to reduce swelling and to treat pain. This medicine may be used for dental pain, headache, or painful monthly periods. It is also used for painful joint and muscular problems such as arthritis, tendinitis, bursitis, and gout. This medicine may be used for other purposes; ask your health care provider or pharmacist if you have questions. COMMON BRAND NAME(S): Aflaxen, Aleve, Aleve Arthritis, All Day Pain Relief, All Day Relief, Anaprox, Anaprox DS, Naprosyn, [facility] Naproxen Sodium What should I tell my health care provider before I take this medicine? They need to know if you have any of these conditions:  cigarette smoker  coronary artery bypass graft (CABG) surgery within the past 2 weeks  drink more than 3 alcohol-containing drinks a day  heart disease  high blood pressure  history of stomach bleeding  kidney disease  liver disease  lung or breathing disease, like asthma  an unusual or allergic reaction to naproxen, aspirin, other NSAIDs, other medicines, foods, dyes, or preservatives  pregnant or trying to get pregnant  breast-feeding How should I use this medicine? Take this medicine by mouth with a glass of water. Follow the directions on the prescription label. Take it with food if your stomach gets upset. Try to not lie down for at least 10 minutes after you take it. Take your medicine at regular intervals. Do not take your medicine more often than directed. Long-term, continuous use may increase the risk of heart attack or stroke. A special MedGuide will be given to you by the pharmacist with each prescription and refill. Be sure to read this information carefully each time. Talk to your pediatrician regarding the use of this medicine in children. Special care may be needed. Overdosage: If you think you  have taken too much of this medicine contact a poison control center or emergency room at once. NOTE: This medicine is only for you. Do not share this medicine with others. What if I miss a dose? If you miss a dose, take it as soon as you can. If it is almost time for your next dose, take only that dose. Do not take double or extra doses. What may interact with this medicine?  alcohol  aspirin  cidofovir  diuretics  lithium  methotrexate  other drugs for inflammation like ketorolac or prednisone  pemetrexed  probenecid  warfarin This list may not describe all possible interactions. Give your health care provider a list of all the medicines, herbs, non-prescription drugs, or dietary supplements you use. Also tell them if you smoke, drink alcohol, or use illegal drugs. Some items may interact with your medicine. What should I watch for while using this medicine? Tell your doctor or healthcare provider if your pain does not get better. Talk to your doctor before taking another medicine for pain. Do not treat yourself. This medicine does not prevent heart attack or stroke. In fact, this medicine may increase the chance of a heart attack or stroke. The chance may increase with longer use of this medicine and in people who have heart disease. If you take aspirin to prevent heart attack or stroke, talk with your doctor or healthcare provider. This medicine may cause serious skin reactions. They can happen weeks to months after starting the medicine. Contact your healthcare provider right away if you notice fevers or flu-like symptoms with a rash. The rash may be red  or purple and then turn into blisters or peeling of the skin. Or, you might notice a red rash with swelling of the face, lips or lymph nodes in your neck or under your arms. Do not take other medicines that contain aspirin, ibuprofen, or naproxen with this medicine. Side effects such as stomach upset, nausea, or ulcers may be more  likely to occur. Many medicines available without a prescription should not be taken with this medicine. This medicine can cause ulcers and bleeding in the stomach and intestines at any time during treatment. Do not smoke cigarettes or drink alcohol. These increase irritation to your stomach and can make it more susceptible to damage from this medicine. Ulcers and bleeding can happen without warning symptoms and can cause death. You may get drowsy or dizzy. Do not drive, use machinery, or do anything that needs mental alertness until you know how this medicine affects you. Do not stand or sit up quickly, especially if you are an older patient. This reduces the risk of dizzy or fainting spells. This medicine can cause you to bleed more easily. Try to avoid damage to your teeth and gums when you brush or floss your teeth. What side effects may I notice from receiving this medicine? Side effects that you should report to your doctor or health care professional as soon as possible:  black or bloody stools, blood in the urine or vomit  blurred vision  chest pain  difficulty breathing or wheezing  nausea or vomiting  redness, blistering, peeling, or loosening of the skin, including inside the mouth  severe stomach pain  skin rash, hives, or itching  slurred speech or weakness on one side of the body  swelling of eyelids, throat, lips  unexplained weight gain or swelling  unusually weak or tired  yellowing of eyes or skin Side effects that usually do not require medical attention (report to your doctor or health care professional if they continue or are bothersome):  constipation  headache  heartburn This list may not describe all possible side effects. Call your doctor for medical advice about side effects. You may report side effects to FDA at 1-800-FDA-1088. Where should I keep my medicine? Keep out of the reach of children. Store at room temperature between 15 and 30 degrees C  (59 and 86 degrees F). Keep container tightly closed. Throw away any unused medicine after the expiration date. NOTE: This sheet is a summary. It may not cover all possible information. If you have questions about this medicine, talk to your doctor, pharmacist, or health care provider.  2020 Elsevier/Gold Standard (2018-05-05 13:24:36) Sciatica  Sciatica is pain, weakness, tingling, or loss of feeling (numbness) along the sciatic nerve. The sciatic nerve starts in the lower back and goes down the back of each leg. Sciatica usually goes away on its own or with treatment. Sometimes, sciatica may come back (recur). What are the causes? This condition happens when the sciatic nerve is pinched or has pressure put on it. This may be the result of:  A disk in between the bones of the spine bulging out too far (herniated disk).  Changes in the spinal disks that occur with aging.  A condition that affects a muscle in the butt.  Extra bone growth near the sciatic nerve.  A break (fracture) of the area between your hip bones (pelvis).  Pregnancy.  Tumor. This is rare. What increases the risk? You are more likely to develop this condition if you:  Play  sports that put pressure or stress on the spine.  Have poor strength and ease of movement (flexibility).  Have had a back injury in the past.  Have had back surgery.  Sit for long periods of time.  Do activities that involve bending or lifting over and over again.  Are very overweight (obese). What are the signs or symptoms? Symptoms can vary from mild to very bad. They may include:  Any of these problems in the lower back, leg, hip, or butt: ? Mild tingling, loss of feeling, or dull aches. ? Burning sensations. ? Sharp pains.  Loss of feeling in the back of the calf or the sole of the foot.  Leg weakness.  Very bad back pain that makes it hard to move. These symptoms may get worse when you cough, sneeze, or laugh. They may also  get worse when you sit or stand for long periods of time. How is this treated? This condition often gets better without any treatment. However, treatment may include:  Changing or cutting back on physical activity when you have pain.  Doing exercises and stretching.  Putting ice or heat on the affected area.  Medicines that help: ? To relieve pain and swelling. ? To relax your muscles.  Shots (injections) of medicines that help to relieve pain, irritation, and swelling.  Surgery. Follow these instructions at home: Medicines  Take over-the-counter and prescription medicines only as told by your doctor.  Ask your doctor if the medicine prescribed to you: ? Requires you to avoid driving or using heavy machinery. ? Can cause trouble pooping (constipation). You may need to take these steps to prevent or treat trouble pooping:  Drink enough fluids to keep your pee (urine) pale yellow.  Take over-the-counter or prescription medicines.  Eat foods that are high in fiber. These include beans, whole grains, and fresh fruits and vegetables.  Limit foods that are high in fat and sugar. These include fried or sweet foods. Managing pain      If told, put ice on the affected area. ? Put ice in a plastic bag. ? Place a towel between your skin and the bag. ? Leave the ice on for 20 minutes, 2-3 times a day.  If told, put heat on the affected area. Use the heat source that your doctor tells you to use, such as a moist heat pack or a heating pad. ? Place a towel between your skin and the heat source. ? Leave the heat on for 20-30 minutes. ? Remove the heat if your skin turns bright red. This is very important if you are unable to feel pain, heat, or cold. You may have a greater risk of getting burned. Activity   Return to your normal activities as told by your doctor. Ask your doctor what activities are safe for you.  Avoid activities that make your symptoms worse.  Take short rests  during the day. ? When you rest for a long time, do some physical activity or stretching between periods of rest. ? Avoid sitting for a long time without moving. Get up and move around at least one time each hour.  Exercise and stretch regularly, as told by your doctor.  Do not lift anything that is heavier than 10 lb (4.5 kg) while you have symptoms of sciatica. ? Avoid lifting heavy things even when you do not have symptoms. ? Avoid lifting heavy things over and over.  When you lift objects, always lift in a way that  is safe for your body. To do this, you should: ? Bend your knees. ? Keep the object close to your body. ? Avoid twisting. General instructions  Stay at a healthy weight.  Wear comfortable shoes that support your feet. Avoid wearing high heels.  Avoid sleeping on a mattress that is too soft or too hard. You might have less pain if you sleep on a mattress that is firm enough to support your back.  Keep all follow-up visits as told by your doctor. This is important. Contact a doctor if:  You have pain that: ? Wakes you up when you are sleeping. ? Gets worse when you lie down. ? Is worse than the pain you have had in the past. ? Lasts longer than 4 weeks.  You lose weight without trying. Get help right away if:  You cannot control when you pee (urinate) or poop (have a bowel movement).  You have weakness in any of these areas and it gets worse: ? Lower back. ? The area between your hip bones. ? Butt. ? Legs.  You have redness or swelling of your back.  You have a burning feeling when you pee. Summary  Sciatica is pain, weakness, tingling, or loss of feeling (numbness) along the sciatic nerve.  This condition happens when the sciatic nerve is pinched or has pressure put on it.  Sciatica can cause pain, tingling, or loss of feeling (numbness) in the lower back, legs, hips, and butt.  Treatment often includes rest, exercise, medicines, and putting ice or  heat on the affected area. This information is not intended to replace advice given to you by your health care provider. Make sure you discuss any questions you have with your health care provider. Document Revised: 03/01/2018 Document Reviewed: 03/01/2018 Elsevier Patient Education  [place].

## 2019-04-27 NOTE — Telephone Encounter (Signed)
This encounter was created in error - please disregard.

## 2019-04-27 NOTE — Progress Notes (Signed)
Integrated Behavioral Health Note  Reason for Referral: Donald York is a 59 y.o. male  Pt was referred by NP, Kathe Becton for: assistance with benefits applications   Pt reports the following concerns: gathering documents to submit with Medicaid and SSD applications  Plan: 1. Addressed today: Patient submitted initial Medicaid application and received communication from social services office. Patient required to sign forms and return, along with some supporting documents. Reviewed forms with patient today and assisted in mailing the forms and supporting documents back to social services.   2. Referral: DHHS  3. Follow up: Will remain available for additional assistance as needed.  [name], [facility] [phone]

## 2019-05-01 DIAGNOSIS — M79604 Pain in right leg: Secondary | ICD-10-CM | POA: Insufficient documentation

## 2019-05-04 NOTE — Progress Notes (Signed)
HPI: FU dyspnea and abnormal ECG.  Patient with history of CVA. CTA of neck October 2020 showed no large vessel occlusion or high-grade stenosis. Echocardiogram March 2021 showed normal LV function, moderate aortic insufficiency, mild to moderate aortic stenosis with mean gradient 15 mmHg.  Since last seen he does note dyspnea on exertion.  There is no orthopnea, PND, pedal edema or syncope.  Occasional brief flutter but no sustained palpitations.  Occasional brief sharp pain but no exertional symptoms.  Patient describes some pain in right hip and down his leg with ambulation relieved with rest.  No current outpatient medications on file.   No current facility-administered medications for this visit.     Past Medical History:  Diagnosis Date  . Alcohol use   . Cocaine use   . CVA (cerebral vascular accident) ([place])   . Dizziness 11/2018  . Family history of adverse reaction to anesthesia    " [name] "  . Peri-rectal abscess 06/09/2016  . PUD (peptic ulcer disease)   . Tobacco use   . Vitamin D deficiency 01/2019    Past Surgical History:  Procedure Laterality Date  . NO PAST SURGERIES      Social History   Socioeconomic History  . Marital status: Single    Spouse name: Not on file  . Number of children: Not on file  . Years of education: Not on file  . Highest education level: Not on file  Occupational History    Comment: Construction  Tobacco Use  . Smoking status: Current Some Day Smoker    Packs/day: 0.50    Types: Cigarettes  . Smokeless tobacco: Never Used  Substance and Sexual Activity  . Alcohol use: Yes    Comment: 40 oz beer, every now and then  . Drug use: Yes    Types: Cocaine, Marijuana  . Sexual activity: Not Currently  Other Topics Concern  . Not on file  Social History Narrative  . Not on file   Social Determinants of Health   Financial Resource Strain:   . Difficulty of Paying Living Expenses:   Food Insecurity:   . Worried About  Charity fundraiser in the Last Year:   . Arboriculturist in the Last Year:   Transportation Needs:   . Film/video editor (Medical):   [name] Lack of Transportation (Non-Medical):   Physical Activity:   . Days of Exercise per Week:   . Minutes of Exercise per Session:   Stress:   . Feeling of Stress :   Social Connections:   . Frequency of Communication with Friends and Family:   . Frequency of Social Gatherings with Friends and Family:   . Attends Religious Services:   . Active Member of Clubs or Organizations:   . Attends Archivist Meetings:   [name] Marital Status:   Intimate Partner Violence:   . Fear of Current or Ex-Partner:   . Emotionally Abused:   [name] Physically Abused:   . Sexually Abused:     Family History  Problem Relation Age of Onset  . Lung cancer Father   . Cancer Maternal Uncle     ROS: no fevers or chills, productive cough, hemoptysis, dysphasia, odynophagia, melena, hematochezia, dysuria, hematuria, rash, seizure activity, orthopnea, PND, pedal edema. Remaining systems are negative.  Physical Exam: Well-developed well-nourished in no acute distress.  Skin is warm and dry.  HEENT is normal.  Neck is supple.  Chest is clear  to auscultation with normal expansion.  Cardiovascular exam is regular rate and rhythm.  1/6 diastolic murmur. Abdominal exam nontender or distended. No masses palpated. Extremities show no edema. neuro grossly intact  A/P  1 moderate aortic insufficiency/mild to moderate aortic stenosis-patient does have some dyspnea on exertion but not volume overloaded on examination and not clear that his symptoms are related to his valve.  I discussed his aortic stenosis/aortic insufficiency with him today.  We will plan repeat echocardiogram in 6 months.  He may require aortic valve replacement in the future if he develops worsening symptoms.  2 abnormal electrocardiogram-previous ECG revealed possible septal infarct.  Echocardiogram shows  normal LV function without wall motion abnormality.  No plans for further evaluation.  3 history of cocaine abuse-patient again counseled on discontinuing.  4 tobacco abuse-patient counseled on discontinuing.  5 question claudication-we will arrange ABIs with Doppler.  Kirk Ruths, MD

## 2019-05-09 ENCOUNTER — Ambulatory Visit: Payer: Self-pay | Admitting: Family Medicine

## 2019-05-16 ENCOUNTER — Ambulatory Visit (INDEPENDENT_AMBULATORY_CARE_PROVIDER_SITE_OTHER): Payer: Self-pay | Admitting: Cardiology

## 2019-05-16 ENCOUNTER — Encounter: Payer: Self-pay | Admitting: Cardiology

## 2019-05-16 ENCOUNTER — Other Ambulatory Visit: Payer: Self-pay

## 2019-05-16 VITALS — BP 100/58 | HR 80 | Ht 72.0 in | Wt 175.6 lb

## 2019-05-16 DIAGNOSIS — I359 Nonrheumatic aortic valve disorder, unspecified: Secondary | ICD-10-CM

## 2019-05-16 DIAGNOSIS — I739 Peripheral vascular disease, unspecified: Secondary | ICD-10-CM

## 2019-05-16 DIAGNOSIS — R0602 Shortness of breath: Secondary | ICD-10-CM

## 2019-05-16 DIAGNOSIS — Z720 Tobacco use: Secondary | ICD-10-CM

## 2019-05-16 NOTE — Patient Instructions (Addendum)
Medication Instructions:  NO CHANGE *If you need a refill on your cardiac medications before your next appointment, please call your pharmacy*   Lab Work: If you have labs (blood work) drawn today and your tests are completely normal, you will receive your results only by: [name] MyChart Message (if you have MyChart) OR . A paper copy in the mail If you have any lab test that is abnormal or we need to change your treatment, we will call you to review the results.   Testing/Procedures: Your physician has requested that you have an echocardiogram. Echocardiography is a painless test that uses sound waves to create images of your heart. It provides your doctor with information about the size and shape of your heart and how well your heart's chambers and valves are working. This procedure takes approximately one hour. There are no restrictions for this procedure.[place] physician has requested that you have an ankle brachial index (ABI). During this test an ultrasound and blood pressure cuff are used to evaluate the arteries that supply the arms and legs with blood. Allow thirty minutes for this exam. There are no restrictions or special instructions.[facility]   Follow-Up: At [facility], you and your health needs are our priority.  As part of our continuing mission to provide you with exceptional heart care, we have created designated Provider Care Teams.  These Care Teams include your primary Cardiologist (physician) and Advanced Practice Providers (APPs -  Physician Assistants and Nurse Practitioners) who all work together to provide you with the care you need, when you need it.  We recommend signing up for the patient portal called "MyChart".  Sign up information is provided on this After Visit Summary.  MyChart is used to connect with patients for Virtual Visits (Telemedicine).  Patients are able to view lab/test results, encounter notes, upcoming  appointments, etc.  Non-urgent messages can be sent to your provider as well.   To learn more about what you can do with MyChart, go to [email or web address].    Your next appointment:   6 month(s)  The format for your next appointment:   Either In Person or Virtual  Provider:   You may see Kirk Ruths MD or one of the following Advanced Practice Providers on your designated Care Team:    Kerin Ransom, PA-C  [place], [place]  Coletta Memos, [place]

## 2019-05-27 ENCOUNTER — Encounter (HOSPITAL_COMMUNITY): Payer: Self-pay

## 2019-05-27 ENCOUNTER — Ambulatory Visit (HOSPITAL_COMMUNITY)
Admission: RE | Admit: 2019-05-27 | Payer: Self-pay | Source: Ambulatory Visit | Attending: Cardiology | Admitting: Cardiology

## 2019-05-30 ENCOUNTER — Other Ambulatory Visit: Payer: Self-pay

## 2019-05-30 ENCOUNTER — Encounter: Payer: Self-pay | Admitting: Family Medicine

## 2019-05-30 ENCOUNTER — Telehealth: Payer: Self-pay

## 2019-05-30 ENCOUNTER — Ambulatory Visit (INDEPENDENT_AMBULATORY_CARE_PROVIDER_SITE_OTHER): Payer: Self-pay | Admitting: Family Medicine

## 2019-05-30 VITALS — BP 98/54 | HR 74 | Temp 98.0°F | Wt 169.0 lb

## 2019-05-30 DIAGNOSIS — M5441 Lumbago with sciatica, right side: Secondary | ICD-10-CM

## 2019-05-30 DIAGNOSIS — G8929 Other chronic pain: Secondary | ICD-10-CM

## 2019-05-30 DIAGNOSIS — M5442 Lumbago with sciatica, left side: Secondary | ICD-10-CM

## 2019-05-30 DIAGNOSIS — Z91199 Patient's noncompliance with other medical treatment and regimen due to unspecified reason: Secondary | ICD-10-CM

## 2019-05-30 DIAGNOSIS — E559 Vitamin D deficiency, unspecified: Secondary | ICD-10-CM

## 2019-05-30 DIAGNOSIS — Z8673 Personal history of transient ischemic attack (TIA), and cerebral infarction without residual deficits: Secondary | ICD-10-CM

## 2019-05-30 DIAGNOSIS — Z9119 Patient's noncompliance with other medical treatment and regimen: Secondary | ICD-10-CM

## 2019-05-30 DIAGNOSIS — Z09 Encounter for follow-up examination after completed treatment for conditions other than malignant neoplasm: Secondary | ICD-10-CM

## 2019-05-30 DIAGNOSIS — R42 Dizziness and giddiness: Secondary | ICD-10-CM

## 2019-05-30 DIAGNOSIS — M79604 Pain in right leg: Secondary | ICD-10-CM

## 2019-05-30 LAB — POCT URINALYSIS DIPSTICK
Bilirubin, UA: NEGATIVE
Blood, UA: NEGATIVE
Glucose, UA: NEGATIVE
Ketones, UA: NEGATIVE
Nitrite, UA: NEGATIVE
Protein, UA: NEGATIVE
Spec Grav, UA: 1.02 (ref 1.010–1.025)
Urobilinogen, UA: 0.2 E.U./dL
pH, UA: 6 (ref 5.0–8.0)

## 2019-05-30 MED ORDER — GABAPENTIN 300 MG PO CAPS: 300.0000 mg | ORAL_CAPSULE | Freq: Three times a day (TID) | ORAL | 6 refills | Status: DC

## 2019-05-30 MED ORDER — CLOPIDOGREL BISULFATE 75 MG PO TABS: 75.0000 mg | ORAL_TABLET | Freq: Every day | ORAL | 6 refills | Status: DC

## 2019-05-30 MED ORDER — VITAMIN D (ERGOCALCIFEROL) 1.25 MG (50000 UNIT) PO CAPS: 50000.0000 [IU] | ORAL_CAPSULE | ORAL | 6 refills | Status: DC

## 2019-05-30 NOTE — Telephone Encounter (Signed)
-----   Message from Donald York, [place] sent at 05/30/2019 11:03 AM EDT ----- Regarding: "Medication Refills" Attempted to contact patient after office visit discharge today. Please inform patient that his medication refills were sent to [facility] today. He can pick up asap. He will not have to pay for these medications. Thank you.

## 2019-05-30 NOTE — Telephone Encounter (Signed)
Message left on [name] voicemail.

## 2019-05-30 NOTE — Progress Notes (Signed)
Patient [facility]   Established Patient Office Visit  Subjective:  Patient ID: Donald York, male    DOB: Apr 29, 1960  Age: 59 y.o. MRN: [id number]  CC:  Chief Complaint  Patient presents with  . Follow-up    Dizzy and blurry vision 4 times yesterday morning  . Fatigue    HPI Donald York is a 59 year male who presents for Follow Up.   Past Medical History:  Diagnosis Date  . Alcohol use   . Cocaine use   . CVA (cerebral vascular accident) ([place])   . Dizziness 11/2018  . Family history of adverse reaction to anesthesia    " [name] "  . Peri-rectal abscess 06/09/2016  . PUD (peptic ulcer disease)   . Tobacco use   . Vitamin D deficiency 01/2019   Current Status: Since his last office visit, he states that he has recent c/o recent 'black outs' on yesterday, in which he could not see and suddenly became weak. He is doing well today and without incidence. He has not been taking any of his medications lately, and states that he did not know that he was to continue taking these medications. He is unsure as to how long he has been without medications. He has follow up appointment with Neurologist on 06/02/2019. He states that all medications are picked up by Dr. [facility]uc[facility]. Medications that he was previously taking Clopidogrel, Gabapentin, and Vitamin D. He denies fevers, chills, fatigue, recent infections, weight loss, and night sweats. He has not had any visual changes, and falls. No chest pain, heart palpitations, cough and shortness of breath reported. No reports of GI problems such as nausea, vomiting, diarrhea, and constipation. He has no reports of blood in stools, dysuria and hematuria. His anxiety is mild today. He denies suicidal ideations, homicidal ideations, or auditory hallucinations. He continues to have right leg pain today.   Past Surgical History:  Procedure Laterality Date  . NO PAST SURGERIES       Family History  Problem Relation Age of Onset  . Lung cancer Father   . Cancer Maternal Uncle     Social History   Socioeconomic History  . Marital status: Single    Spouse name: Not on file  . Number of children: Not on file  . Years of education: Not on file  . Highest education level: Not on file  Occupational History    Comment: Construction  Tobacco Use  . Smoking status: Current Some Day Smoker    Packs/day: 0.50    Types: Cigarettes  . Smokeless tobacco: Never Used  Substance and Sexual Activity  . Alcohol use: Yes    Comment: 40 oz beer, every now and then  . Drug use: Yes    Types: Cocaine, Marijuana  . Sexual activity: Not Currently  Other Topics Concern  . Not on file  Social History Narrative  . Not on file   Social Determinants of Health   Financial Resource Strain:   . Difficulty of Paying Living Expenses:   Food Insecurity:   . Worried About Charity fundraiser in the Last Year:   . Arboriculturist in the Last Year:   Transportation Needs:   . Film/video editor (Medical):   [name] Lack of Transportation (Non-Medical):   Physical Activity:   . Days of Exercise per Week:   . Minutes of Exercise per Session:   Stress:   .  Feeling of Stress :   Social Connections:   . Frequency of Communication with Friends and Family:   . Frequency of Social Gatherings with Friends and Family:   . Attends Religious Services:   . Active Member of Clubs or Organizations:   . Attends Archivist Meetings:   [name] Marital Status:   Intimate Partner Violence:   . Fear of Current or Ex-Partner:   . Emotionally Abused:   [name] Physically Abused:   . Sexually Abused:     No outpatient medications prior to visit.   No facility-administered medications prior to visit.    No Known Allergies  ROS Review of Systems  Constitutional: Negative.   HENT: Negative.   Eyes: Negative.   Respiratory: Negative.   Cardiovascular: Negative.   Gastrointestinal:  Negative.   Endocrine: Negative.   Genitourinary: Negative.   Musculoskeletal: Positive for arthralgias (generalized joint pain).  Skin: Negative.   Allergic/Immunologic: Negative.   Neurological: Positive for dizziness (occasional ), weakness and headaches (occasional ).  Hematological: Negative.   Psychiatric/Behavioral: Negative.       Objective:    Physical Exam  Constitutional: He is oriented to person, place, and time. He appears well-developed and well-nourished.  HENT:  Head: Normocephalic and atraumatic.  Eyes: Conjunctivae are normal.  Cardiovascular: Normal rate, normal heart sounds and intact distal pulses.  Pulmonary/Chest: Effort normal and breath sounds normal.  Abdominal: Soft. Bowel sounds are normal.  Musculoskeletal:        General: Normal range of motion.     Cervical back: Normal range of motion and neck supple.  Neurological: He is alert and oriented to person, place, and time. He has normal reflexes.  Skin: Skin is warm and dry.  Psychiatric: He has a normal mood and affect. His behavior is normal. Judgment and thought content normal.  Nursing note and vitals reviewed.   BP (!) 98/54   Pulse 74   Temp 98 F (36.7 C) (Oral)   Wt 169 lb (76.7 kg)   SpO2 99%   BMI 22.92 kg/m  Wt Readings from Last 3 Encounters:  05/30/19 169 lb (76.7 kg)  05/16/19 175 lb 9.6 oz (79.7 kg)  04/27/19 172 lb 9.6 oz (78.3 kg)     Health Maintenance Due  Topic Date Due  . Hepatitis C Screening  Never done  . COLONOSCOPY  Never done    There are no preventive care reminders to display for this patient.  Lab Results  Component Value Date   TSH 1.780 01/31/2019   Lab Results  Component Value Date   WBC 14.9 (H) 12/20/2018   HGB 15.3 12/21/2018   HCT 45.0 12/21/2018   MCV 92.7 12/20/2018   PLT 247 12/20/2018   Lab Results  Component Value Date   NA 139 12/21/2018   K 3.6 12/21/2018   CO2 23 12/20/2018   GLUCOSE 111 (H) 12/21/2018   BUN 10 12/21/2018    CREATININE 1.40 (H) 12/21/2018   BILITOT 0.3 12/20/2018   ALKPHOS 43 12/20/2018   AST 22 12/20/2018   ALT 19 12/20/2018   PROT 5.1 (L) 12/20/2018   ALBUMIN 3.1 (L) 12/20/2018   CALCIUM 8.5 (L) 12/20/2018   ANIONGAP 10 12/20/2018   No results found for: CHOL No results found for: HDL No results found for: LDLCALC No results found for: TRIG No results found for: [facility] Lab Results  Component Value Date   HGBA1C 5.2 01/31/2019   Assessment & Plan:   1. History of  stroke Rx refills sent to pharmacy today. He will re-start taking medications as prescribed. He will keep follow up appointment with Neurology on 06/02/2019. - clopidogrel (PLAVIX) 75 MG tablet; Take 1 tablet (75 mg total) by mouth daily.  Dispense: 90 tablet; Refill: 6  2. Right leg pain He will restart Gabapentin today and take as prescribed.   3. Dizziness Stable today. No signs or symptoms of unsteadiness, lightheadedness, or dizziness noted or reported today.  - POCT urinalysis dipstick  4. Current non-adherence to medical treatment All Rx refills sent to pharmacy today. Patient will begin taking all medications as prescribed.   5. Chronic bilateral low back pain with bilateral sciatica - gabapentin (NEURONTIN) 300 MG capsule; Take 1 capsule (300 mg total) by mouth 3 (three) times daily.  Dispense: 90 capsule; Refill: 6  6. Vitamin D deficiency - Vitamin D, Ergocalciferol, (DRISDOL) 1.25 MG (50000 UNIT) CAPS capsule; Take 1 capsule (50,000 Units total) by mouth every 7 (seven) days.  Dispense: 5 capsule; Refill: 6  7. Follow up He will follow up in 1 month.   Meds ordered this encounter  Medications  . clopidogrel (PLAVIX) 75 MG tablet    Sig: Take 1 tablet (75 mg total) by mouth daily.    Dispense:  90 tablet    Refill:  6    **[place]**  . gabapentin (NEURONTIN) 300 MG capsule    Sig: Take 1 capsule (300 mg total) by mouth 3 (three) times daily.    Dispense:  90 capsule    Refill:   6    **[place]**  . Vitamin D, Ergocalciferol, (DRISDOL) 1.25 MG (50000 UNIT) CAPS capsule    Sig: Take 1 capsule (50,000 Units total) by mouth every 7 (seven) days.    Dispense:  5 capsule    Refill:  6    **[place]**    Orders Placed This Encounter  Procedures  . POCT urinalysis dipstick    Referral Orders  No referral(s) requested today    Kathe Becton,  MSN, FNP-BC [facility] [address] [place], [place] [address] [phone] fax   Problem List Items Addressed This Visit      Other   Dizziness   Relevant Orders   POCT urinalysis dipstick (Completed)   History of stroke - Primary   Relevant Medications   clopidogrel (PLAVIX) 75 MG tablet   Right leg pain    Other Visit Diagnoses    Current non-adherence to medical treatment       Chronic bilateral low back pain with bilateral sciatica       Relevant Medications   gabapentin (NEURONTIN) 300 MG capsule   Vitamin D deficiency       Relevant Medications   Vitamin D, Ergocalciferol, (DRISDOL) 1.25 MG (50000 UNIT) CAPS capsule   Follow up          Meds ordered this encounter  Medications  . clopidogrel (PLAVIX) 75 MG tablet    Sig: Take 1 tablet (75 mg total) by mouth daily.    Dispense:  90 tablet    Refill:  6    **[place]**  . gabapentin (NEURONTIN) 300 MG capsule    Sig: Take 1 capsule (300 mg total) by mouth 3 (three) times daily.    Dispense:  90 capsule    Refill:  6    **[place]**  . Vitamin D, Ergocalciferol, (DRISDOL)  1.25 MG (50000 UNIT) CAPS capsule    Sig: Take 1 capsule (50,000 Units total) by mouth every 7 (seven) days.    Dispense:  5 capsule    Refill:  6    **[place]**    Follow-up: Return in about 1 month (around 06/29/2019).    Azzie Glatter, FNP

## 2019-06-01 ENCOUNTER — Telehealth: Payer: Self-pay

## 2019-06-01 NOTE — Telephone Encounter (Signed)
Azzie Glatter, FNP  Jorja Loa, RMA  Faythe Ghee. Please place this (below) information as "Telephone Encounter" for easier access and as documentation that we have attempted to contact patient concerning much needed medication. Thank you.       Previous Messages   ----- Message -----  From: Jorja Loa, RMA  Sent: 05/30/2019 11:32 AM EDT  To: Azzie Glatter, FNP  Subject: FW: "Medication Refills"             I was able to reach his sister. She will tell him.  ----- Message -----  From: Azzie Glatter, FNP  Sent: 05/30/2019 11:03 AM EDT  To: Jorja Loa, RMA  Subject: "Medication Refills"               Attempted to contact patient after office visit discharge today. Please inform patient that his medication refills were sent to [facility] today. He can pick up asap. He will not have to pay for these medications. Thank you.

## 2019-06-01 NOTE — Telephone Encounter (Signed)
-----   Message from Azzie Glatter, [place] sent at 05/31/2019  9:50 PM EDT ----- Regarding: RE: "Medication Refills" Okay. Please place this (below) information as "Telephone Encounter" for easier access and as documentation that we have attempted to contact patient concerning much needed medication. Thank you.   ----- Message ----- From: Jorja Loa, RMA Sent: 05/30/2019  11:32 AM EDT To: Azzie Glatter, FNP Subject: FW: "Medication Refills"                       I was able to reach his sister. She will tell him.  ----- Message ----- From: Azzie Glatter, FNP Sent: 05/30/2019  11:03 AM EDT To: Jorja Loa, RMA Subject: "Medication Refills"                           Attempted to contact patient after office visit discharge today. Please inform patient that his medication refills were sent to [facility] today. He can pick up asap. He will not have to pay for these medications. Thank you.

## 2019-06-02 ENCOUNTER — Other Ambulatory Visit: Payer: Self-pay

## 2019-06-02 ENCOUNTER — Ambulatory Visit (HOSPITAL_COMMUNITY)
Admission: RE | Admit: 2019-06-02 | Discharge: 2019-06-02 | Disposition: A | Discharge status: Home / Self Care | Payer: Self-pay | Source: Ambulatory Visit | Attending: Cardiovascular Disease | Admitting: Cardiovascular Disease

## 2019-06-02 ENCOUNTER — Ambulatory Visit: Payer: Self-pay | Admitting: Neurology

## 2019-06-02 DIAGNOSIS — I739 Peripheral vascular disease, unspecified: Secondary | ICD-10-CM | POA: Insufficient documentation

## 2019-06-09 ENCOUNTER — Encounter (HOSPITAL_COMMUNITY): Payer: Self-pay

## 2019-06-13 ENCOUNTER — Emergency Department (HOSPITAL_COMMUNITY)
Admission: EM | Admit: 2019-06-13 | Discharge: 2019-06-13 | Disposition: A | Discharge status: Home / Self Care | Payer: Self-pay | Attending: Emergency Medicine | Admitting: Emergency Medicine

## 2019-06-13 ENCOUNTER — Other Ambulatory Visit: Payer: Self-pay

## 2019-06-13 ENCOUNTER — Encounter (HOSPITAL_COMMUNITY): Payer: Self-pay

## 2019-06-13 DIAGNOSIS — F17210 Nicotine dependence, cigarettes, uncomplicated: Secondary | ICD-10-CM | POA: Insufficient documentation

## 2019-06-13 DIAGNOSIS — M5431 Sciatica, right side: Secondary | ICD-10-CM | POA: Insufficient documentation

## 2019-06-13 DIAGNOSIS — Z8673 Personal history of transient ischemic attack (TIA), and cerebral infarction without residual deficits: Secondary | ICD-10-CM | POA: Insufficient documentation

## 2019-06-13 DIAGNOSIS — Z7902 Long term (current) use of antithrombotics/antiplatelets: Secondary | ICD-10-CM | POA: Insufficient documentation

## 2019-06-13 MED ORDER — PREDNISONE 10 MG (21) PO TBPK: ORAL_TABLET | ORAL | 0 refills | Status: DC

## 2019-06-13 MED ORDER — MORPHINE SULFATE (PF) 4 MG/ML IV SOLN: 4.0000 mg | Freq: Once | INTRAVENOUS | Status: DC

## 2019-06-13 MED ORDER — DEXAMETHASONE SODIUM PHOSPHATE 10 MG/ML IJ SOLN
10.0000 mg | Freq: Once | INTRAMUSCULAR | Status: AC
  Administered 2019-06-13: 10 mg via INTRAMUSCULAR
  Filled 2019-06-13: qty 1

## 2019-06-13 MED ORDER — HYDROCODONE-ACETAMINOPHEN 5-325 MG PO TABS: 1.0000 | ORAL_TABLET | ORAL | 0 refills | Status: DC | PRN

## 2019-06-13 NOTE — ED Notes (Signed)
Due to pt not having a ride and going to work, [employer] held

## 2019-06-13 NOTE — ED Provider Notes (Signed)
[facility] DEPT Provider Note   CSN: [id number] Arrival date & time: 06/13/19  [id number]     History Chief Complaint  Patient presents with  . Leg Pain  . Back Pain    Donald York is a 59 y.o. male.  Pt presents to the ED today with back and right leg pain.  Pt said he's had this pain for months.  He has seen his pcp who put him on Naprosyn.  This has not helped.  He has been evaluated with ABI and lower extremity arterial US (4/10) which were negative.  Pt said pain is worse and it is hurting a lot to walk.  He denies any new injury.  He is able to walk, but it does hurt.  No bowel or bladder problems.  No fevers.        Past Medical History:  Diagnosis Date  . Alcohol use   . Cocaine use   . CVA (cerebral vascular accident) ([place])   . Dizziness 11/2018  . Family history of adverse reaction to anesthesia    " [name] "  . Peri-rectal abscess 06/09/2016  . PUD (peptic ulcer disease)   . Tobacco use   . Vitamin D deficiency 01/2019    Patient Active Problem List   Diagnosis Date Noted  . Right leg pain 05/01/2019  . Chest discomfort 04/12/2019  . Temporal arteritis ([place]) 04/05/2019  . History of stroke 04/05/2019  . Chronic left-sided headaches 04/05/2019  . Shortness of breath 04/05/2019  . Stroke-like symptom 02/01/2019  . Numbness and tingling of both legs 02/01/2019  . Light headedness 02/01/2019  . Dizziness 02/01/2019  . Alcohol use 02/01/2019  . Duodenal ulcer 05/25/2018  . Perirectal cellulitis 06/09/2016  . AKI (acute kidney injury) ([place]) 06/09/2016  . Tobacco abuse 06/09/2016  . Cocaine use 06/09/2016    Past Surgical History:  Procedure Laterality Date  . NO PAST SURGERIES         Family History  Problem Relation Age of Onset  . Lung cancer Father   . Cancer Maternal Uncle     Social History   Tobacco Use  . Smoking status: Current Some Day Smoker    Packs/day: 0.50    Types: Cigarettes  . Smokeless  tobacco: Never Used  Substance Use Topics  . Alcohol use: Yes    Comment: 40 oz beer, every now and then  . Drug use: Yes    Types: Cocaine, Marijuana    Home Medications Prior to Admission medications   Medication Sig Start Date End Date Taking? Authorizing Provider  clopidogrel (PLAVIX) 75 MG tablet Take 1 tablet (75 mg total) by mouth daily. 05/30/19   Azzie Glatter, FNP  gabapentin (NEURONTIN) 300 MG capsule Take 1 capsule (300 mg total) by mouth 3 (three) times daily. 05/30/19   Azzie Glatter, FNP  HYDROcodone-acetaminophen (NORCO/VICODIN) 5-325 MG tablet Take 1 tablet by mouth every 4 (four) hours as needed. 06/13/19   Isla Pence, MD  predniSONE (STERAPRED UNI-PAK 21 TAB) 10 MG (21) TBPK tablet Take 6 tabs for 2 days, then 5 for 2 days, then 4 for 2 days, then 3 for 2 days, 2 for 2 days, then 1 for 2 days 06/13/19   Isla Pence, MD  Vitamin D, Ergocalciferol, (DRISDOL) 1.25 MG (50000 UNIT) CAPS capsule Take 1 capsule (50,000 Units total) by mouth every 7 (seven) days. 05/30/19   Azzie Glatter, FNP    Allergies  Patient has no known allergies.  Review of Systems   Review of Systems  Musculoskeletal: Positive for back pain.       Right leg pain  All other systems reviewed and are negative.   Physical Exam Updated Vital Signs BP 102/62 (BP Location: Left Arm)   Pulse 84   Temp 98 F (36.7 C) (Oral)   Resp 16   Ht 6' (1.829 m)   Wt 76.7 kg   SpO2 99%   BMI 22.92 kg/m   Physical Exam Vitals and nursing note reviewed.  Constitutional:      Appearance: Normal appearance.  HENT:     Head: Normocephalic and atraumatic.     Right Ear: External ear normal.     Left Ear: External ear normal.     Nose: Nose normal.     Mouth/Throat:     Mouth: Mucous membranes are moist.     Pharynx: Oropharynx is clear.  Eyes:     Extraocular Movements: Extraocular movements intact.     Conjunctiva/sclera: Conjunctivae normal.     Pupils: Pupils are equal, round, and  reactive to light.  Cardiovascular:     Rate and Rhythm: Normal rate and regular rhythm.     Pulses: Normal pulses.     Heart sounds: Normal heart sounds.  Pulmonary:     Effort: Pulmonary effort is normal.     Breath sounds: Normal breath sounds.  Abdominal:     General: Abdomen is flat. Bowel sounds are normal.     Palpations: Abdomen is soft.  Musculoskeletal:        General: Normal range of motion.     Cervical back: Normal range of motion and neck supple.     Comments: + straight leg raise on the right  Skin:    General: Skin is warm.     Capillary Refill: Capillary refill takes less than 2 seconds.  Neurological:     General: No focal deficit present.     Mental Status: He is alert and oriented to person, place, and time.  Psychiatric:        Mood and Affect: Mood normal.        Behavior: Behavior normal.     ED Results / Procedures / Treatments   Labs (all labs ordered are listed, but only abnormal results are displayed) Labs Reviewed - No data to display  EKG None  Radiology No results found.  Procedures Procedures (including critical care time)  Medications Ordered in ED Medications  dexamethasone (DECADRON) injection 10 mg (has no administration in time range)  morphine 4 MG/ML injection 4 mg (has no administration in time range)    ED Course  I have reviewed the triage vital signs and the nursing notes.  Pertinent labs & imaging results that were available during my care of the patient were reviewed by me and considered in my medical decision making (see chart for details).    MDM Rules/Calculators/A&P                      Exam consistent with acute sciatica.  He is given a dose of decadron and morphine in ED.  He is given exercises for low back.  He is instructed to return if worse. PDMP review shows no recent narcotic rx.  He will be given a rx for #10 lortab.  F/u with ortho.   Final Clinical Impression(s) / ED Diagnoses Final diagnoses:    Sciatica of right side  Rx / DC Orders ED Discharge Orders         Ordered    predniSONE (STERAPRED UNI-PAK 21 TAB) 10 MG (21) TBPK tablet     06/13/19 0847    HYDROcodone-acetaminophen (NORCO/VICODIN) 5-325 MG tablet  Every 4 hours PRN     06/13/19 0847           Isla Pence, MD 06/13/19 [phone]

## 2019-06-13 NOTE — ED Triage Notes (Signed)
Patient c/o right lower back pain that radiates into the right leg. Patient states he was seen for the same 2 weeks ago. Patient states the pain has worsened.

## 2019-06-16 ENCOUNTER — Other Ambulatory Visit: Payer: Self-pay | Admitting: Critical Care Medicine

## 2019-06-16 MED ORDER — HYDROCODONE-ACETAMINOPHEN 5-325 MG PO TABS: 1.0000 | ORAL_TABLET | ORAL | 0 refills | Status: DC | PRN

## 2019-06-16 MED ORDER — PREDNISONE 10 MG (21) PO TBPK: ORAL_TABLET | ORAL | 0 refills | Status: DC

## 2019-06-16 MED FILL — HYDROCODON-APAP 5-325: 5-325 | 2 days supply | Qty: 10 | Fill #0

## 2019-06-16 MED FILL — predniSONE 10 MG TABS: 10 | 12 days supply | Qty: 42 | Fill #0

## 2019-06-16 NOTE — Progress Notes (Signed)
Rx for back pain norco/pred.  From ED not able to afford at [facility]  Transferred to [facility] on [address]

## 2019-06-23 ENCOUNTER — Encounter: Payer: Self-pay | Admitting: Critical Care Medicine

## 2019-06-23 ENCOUNTER — Other Ambulatory Visit: Payer: Self-pay | Admitting: Critical Care Medicine

## 2019-06-23 DIAGNOSIS — G8929 Other chronic pain: Secondary | ICD-10-CM

## 2019-06-23 MED ORDER — NAPROXEN 500 MG PO TABS: 500.0000 mg | ORAL_TABLET | Freq: Two times a day (BID) | ORAL | 0 refills | Status: DC

## 2019-06-23 MED ORDER — GABAPENTIN 400 MG PO CAPS: 400.0000 mg | ORAL_CAPSULE | Freq: Three times a day (TID) | ORAL | 0 refills | Status: DC

## 2019-06-23 MED FILL — GABAPENTIN 400 MG CAPSULE: 400 | 30 days supply | Qty: 90 | Fill #0

## 2019-06-23 MED FILL — NAPROXEN 500 MG TABLET: 500 | 30 days supply | Qty: 60 | Fill #0

## 2019-06-24 NOTE — Progress Notes (Signed)
Patient ID: Donald York, male   DOB: 08/30/1960, 59 y.o.   MRN: [id number] This is a 59 year old male seen in the [facility] today for ongoing low back pain and severe pain in the right leg.  Patient has significant lumbar disease with radiculopathy.  He has been to the emergency room recently for same.  He states the prednisone pulse did not help he states the Lidoderm patch is of no benefit  On exam the patient has weakness in the right lower extremity and pinpoint tenderness right lower back  Plan to be for the patient receive Naprosyn 500 mg twice daily and gabapentin 40 mg 3 times daily  The patient has an existing follow-up visit with orthopedics for further evaluation of the lumbar spine and he is encouraged to keep that appointment

## 2019-06-29 ENCOUNTER — Ambulatory Visit (INDEPENDENT_AMBULATORY_CARE_PROVIDER_SITE_OTHER): Payer: Self-pay | Admitting: Family Medicine

## 2019-06-29 ENCOUNTER — Encounter: Payer: Self-pay | Admitting: Family Medicine

## 2019-06-29 ENCOUNTER — Other Ambulatory Visit: Payer: Self-pay

## 2019-06-29 VITALS — BP 119/65 | HR 85 | Temp 98.3°F | Ht 72.0 in | Wt 169.8 lb

## 2019-06-29 DIAGNOSIS — Z09 Encounter for follow-up examination after completed treatment for conditions other than malignant neoplasm: Secondary | ICD-10-CM

## 2019-06-29 DIAGNOSIS — M5441 Lumbago with sciatica, right side: Secondary | ICD-10-CM

## 2019-06-29 DIAGNOSIS — G8929 Other chronic pain: Secondary | ICD-10-CM | POA: Insufficient documentation

## 2019-06-29 DIAGNOSIS — M79604 Pain in right leg: Secondary | ICD-10-CM

## 2019-06-29 DIAGNOSIS — M5442 Lumbago with sciatica, left side: Secondary | ICD-10-CM

## 2019-06-29 DIAGNOSIS — Z8673 Personal history of transient ischemic attack (TIA), and cerebral infarction without residual deficits: Secondary | ICD-10-CM

## 2019-06-29 MED ORDER — GABAPENTIN 400 MG PO CAPS: 400.0000 mg | ORAL_CAPSULE | Freq: Three times a day (TID) | ORAL | 1 refills | Status: DC

## 2019-06-29 MED ORDER — KETOROLAC TROMETHAMINE 60 MG/2ML IM SOLN
60.0000 mg | Freq: Once | INTRAMUSCULAR | Status: AC
  Administered 2019-06-29: 09:00:00 60 mg via INTRAMUSCULAR

## 2019-06-29 MED ORDER — NAPROXEN 500 MG PO TABS: 500.0000 mg | ORAL_TABLET | Freq: Two times a day (BID) | ORAL | 1 refills | Status: DC

## 2019-06-29 NOTE — Progress Notes (Signed)
Patient Donald York and Donald York Follow Up   Subjective:  Patient ID: Donald York, male    DOB: 1961/01/28  Age: 59 y.o. MRN: [id number]  CC:  Chief Complaint  Patient presents with  . Hospitalization Follow-up    Ed 06/13/2019 Sciatica of Right side     HPI Donald York is a 59 year old male who presents for York Follow Up today.    Past Medical History:  Diagnosis Date  . Alcohol use   . Cocaine use   . CVA (cerebral vascular accident) ([facility])   . Dizziness 11/2018  . Family history of adverse reaction to anesthesia    " [name] "  . Peri-rectal abscess 06/09/2016  . PUD (peptic ulcer disease)   . Tobacco use   . Vitamin D deficiency 01/2019   Current Status: Since his last office visit, he has had a ED visit on 06/13/2019 for Sciatica of Right Side. Today, he continues to have increased pain. He rates his pain level in right leg at over 10/10. He states that it is very difficult for him to work, because of standing. He states that Naproxen and Gabapentin is effective in relieving his pain until he begins to walk. He has appointment with Orthopedics on 07/04/2019. He denies fevers, chills, fatigue, recent infections, weight loss, and night sweats. He has not had any headaches, visual changes, dizziness, and falls. No chest pain, and cough reported. Denies GI problems such as nausea, vomiting, diarrhea, and constipation. He has no reports of blood in stools, dysuria and hematuria. No depression or anxiety, and denies suicidal ideations, homicidal ideations, or auditory hallucinations. He is taking all medications as prescribed.   Past Surgical History:  Procedure Laterality Date  . NO PAST SURGERIES      Family History  Problem Relation Age of Onset  . Lung cancer Father   . Cancer Maternal Uncle     Social History   Socioeconomic History  . Marital status: Single    Spouse name: Not on file  . Number of children: Not on  file  . Years of education: Not on file  . Highest education level: Not on file  Occupational History    Comment: Construction  Tobacco Use  . Smoking status: Current Some Day Smoker    Packs/day: 0.50    Types: Cigarettes  . Smokeless tobacco: Never Used  Substance and Sexual Activity  . Alcohol use: Yes    Comment: 40 oz beer, every now and then  . Drug use: Yes    Types: Cocaine, Marijuana  . Sexual activity: Not Currently  Other Topics Concern  . Not on file  Social History Narrative  . Not on file   Social Determinants of Health   Financial Resource Strain:   . Difficulty of Paying Living Expenses:   Food Insecurity:   . Worried About Charity fundraiser in the Last Year:   . Arboriculturist in the Last Year:   Transportation Needs:   . Film/video editor (Medical):   [name] Lack of Transportation (Non-Medical):   Physical Activity:   . Days of Exercise per Week:   . Minutes of Exercise per Session:   Stress:   . Feeling of Stress :   Social Connections:   . Frequency of Communication with Friends and Family:   . Frequency of Social Gatherings with Friends and Family:   . Attends Religious Services:   .  Active Member of Clubs or Organizations:   . Attends Archivist Meetings:   [name] Marital Status:   Intimate Partner Violence:   . Fear of Current or Ex-Partner:   . Emotionally Abused:   [name] Physically Abused:   . Sexually Abused:     Outpatient Medications Prior to Visit  Medication Sig Dispense Refill  . aspirin EC 325 MG tablet Take 325 mg by mouth daily.    . clopidogrel (PLAVIX) 75 MG tablet Take 1 tablet (75 mg total) by mouth daily. 90 tablet 6  . Vitamin D, Ergocalciferol, (DRISDOL) 1.25 MG (50000 UNIT) CAPS capsule Take 1 capsule (50,000 Units total) by mouth every 7 (seven) days. 5 capsule 6  . gabapentin (NEURONTIN) 400 MG capsule Take 1 capsule (400 mg total) by mouth 3 (three) times daily. 90 capsule 0  . naproxen (NAPROSYN) 500 MG tablet  Take 1 tablet (500 mg total) by mouth 2 (two) times daily with a meal. 60 tablet 0  . predniSONE (STERAPRED UNI-PAK 21 TAB) 10 MG (21) TBPK tablet Take 6 tabs for 2 days, then 5 for 2 days, then 4 for 2 days, then 3 for 2 days, 2 for 2 days, then 1 for 2 days (Patient not taking: Reported on 06/29/2019) 42 tablet 0   No facility-administered medications prior to visit.    No Known Allergies  ROS Review of Systems  Constitutional: Positive for fatigue.  HENT: Negative.   Eyes: Negative.   Respiratory: Positive for shortness of breath (occasional ).   Cardiovascular: Positive for palpitations (occasional ).  Gastrointestinal: Negative.   Endocrine: Negative.   Genitourinary: Negative.   Musculoskeletal: Positive for arthralgias (generalized joint pain) and back pain (chronic back pain which radiates to right leg. ).  Skin: Negative.   Allergic/Immunologic: Negative.   Neurological: Positive for dizziness (occasional ), weakness (bilateral lower extremities. ), numbness (numbness/tingling in righ lower extremity) and headaches (occasional ).  Hematological: Negative.   Psychiatric/Behavioral: The patient is nervous/anxious and is hyperactive.       Objective:    Physical Exam  Constitutional: He is oriented to person, place, and time. He appears well-nourished.  HENT:  Head: Normocephalic and atraumatic.  Eyes: Conjunctivae are normal.  Cardiovascular: Normal rate, regular rhythm, normal heart sounds and intact distal pulses.  Pulmonary/Chest: Effort normal and breath sounds normal.  Abdominal: Soft. Bowel sounds are normal.  Musculoskeletal:     Cervical back: Normal range of motion and neck supple.     Comments: Decreased ROM in spine and bilateral lower extremities.   Neurological: He is alert and oriented to person, place, and time.  Skin: Skin is warm and dry.  Psychiatric:  Anixious.  Nursing note and vitals reviewed.   BP 119/65   Pulse 85   Temp 98.3 F (36.8 C)    Ht 6' (1.829 m)   Wt 169 lb 12.8 oz (77 kg)   SpO2 99%   BMI 23.03 kg/m  Wt Readings from Last 3 Encounters:  06/29/19 169 lb 12.8 oz (77 kg)  06/13/19 169 lb (76.7 kg)  05/30/19 169 lb (76.7 kg)     Health Maintenance Due  Topic Date Due  . Hepatitis C Screening  Never done  . COVID-19 Vaccine (1) Never done  . COLONOSCOPY  Never done    There are no preventive care reminders to display for this patient.  Lab Results  Component Value Date   TSH 1.780 01/31/2019   Lab Results  Component Value  Date   WBC 14.9 (H) 12/20/2018   HGB 15.3 12/21/2018   HCT 45.0 12/21/2018   MCV 92.7 12/20/2018   PLT 247 12/20/2018   Lab Results  Component Value Date   NA 139 12/21/2018   K 3.6 12/21/2018   CO2 23 12/20/2018   GLUCOSE 111 (H) 12/21/2018   BUN 10 12/21/2018   CREATININE 1.40 (H) 12/21/2018   BILITOT 0.3 12/20/2018   ALKPHOS 43 12/20/2018   AST 22 12/20/2018   ALT 19 12/20/2018   PROT 5.1 (L) 12/20/2018   ALBUMIN 3.1 (L) 12/20/2018   CALCIUM 8.5 (L) 12/20/2018   ANIONGAP 10 12/20/2018   No results found for: CHOL No results found for: HDL No results found for: LDLCALC No results found for: TRIG No results found for: [facility] Lab Results  Component Value Date   HGBA1C 5.2 01/31/2019      Assessment & Plan:   1. York discharge follow-up  2. Right leg pain Continue pain medications as prescribed. Keep follow up appointment with Orthopedics on 07/04/2019.   3. History of stroke Stable. No signs or symptoms of recurrence noted or reported today.   4. Chronic bilateral low back pain with bilateral sciatica - gabapentin (NEURONTIN) 400 MG capsule; Take 1 capsule (400 mg total) by mouth 3 (three) times daily.  Dispense: 90 capsule; Refill: 1 - naproxen (NAPROSYN) 500 MG tablet; Take 1 tablet (500 mg total) by mouth 2 (two) times daily with a meal.  Dispense: 60 tablet; Refill: 1 - ketorolac (TORADOL) injection 60 mg  5. Follow up He will follow up in 3  months.   Meds ordered this encounter  Medications  . gabapentin (NEURONTIN) 400 MG capsule    Sig: Take 1 capsule (400 mg total) by mouth 3 (three) times daily.    Dispense:  90 capsule    Refill:  1    **[place]**  . naproxen (NAPROSYN) 500 MG tablet    Sig: Take 1 tablet (500 mg total) by mouth 2 (two) times daily with a meal.    Dispense:  60 tablet    Refill:  1    [facility] nurse fund  . ketorolac (TORADOL) injection 60 mg    No orders of the defined types were placed in this encounter.   Referral Orders  No referral(s) requested today    Kathe Becton,  MSN, FNP-BC [address], [place] [address] [phone]- fax  Problem List Items Addressed This Visit      Other   History of stroke   Right leg pain    Other Visit Diagnoses    York discharge follow-up    -  Primary   Chronic bilateral low back pain with bilateral sciatica       Relevant Medications   gabapentin (NEURONTIN) 400 MG capsule   naproxen (NAPROSYN) 500 MG tablet   ketorolac (TORADOL) injection 60 mg (Completed) (Start on 06/29/2019  9:30 AM)   Follow up          Meds ordered this encounter  Medications  . gabapentin (NEURONTIN) 400 MG capsule    Sig: Take 1 capsule (400 mg total) by mouth 3 (three) times daily.    Dispense:  90 capsule    Refill:  1    **[place]**  . naproxen (NAPROSYN) 500 MG tablet    Sig: Take 1 tablet (500 mg total) by mouth 2 (  two) times daily with a meal.    Dispense:  60 tablet    Refill:  1    [facility]reg[facility] nurse fund  . ketorolac (TORADOL) injection 60 mg    Follow-up: Return in about 3 months (around 09/29/2019).    Azzie Glatter, FNP

## 2019-06-29 NOTE — Progress Notes (Signed)
[facility] General Follow Up Note  06/29/2019 Name: Donald York MRN: [id number] DOB: 1960-03-17 Donald York is a 59 y.o. year old male who sees Azzie Glatter, FNP for primary care. LCSW was initially consulted to assist patient in applying for Medicaid and disability.  Ongoing Intervention: Patient experiencing homelessness, low income, and lack of insurance coverage. Today patient requested assistance in following up on his Medicaid and disability applications. Indicated that when he last checked with Medicaid his application was still pending. He also tried setting up an appointment with Social Security to apply for disability, but there was a miscommunication about his appointment. CSW and patient scheduled to call Social Security and Medicaid together on 07/05/19.  Review of patient status, including review of consultants reports, relevant laboratory and other test results, and collaboration with appropriate care team members and the patient's provider was performed as part of comprehensive patient evaluation and provision of services.     Follow up Plan: 1. Assist patient in following up on benefits applications  [name], [facility] [phone]

## 2019-07-04 ENCOUNTER — Other Ambulatory Visit: Payer: Self-pay | Admitting: Orthopedic Surgery

## 2019-07-04 ENCOUNTER — Telehealth: Payer: Self-pay | Admitting: Family Medicine

## 2019-07-04 DIAGNOSIS — M545 Low back pain, unspecified: Secondary | ICD-10-CM

## 2019-07-04 NOTE — Telephone Encounter (Signed)
Please fax note to [phone], [facility], ATT: Daryel Gerald.

## 2019-07-06 ENCOUNTER — Other Ambulatory Visit: Payer: Self-pay | Admitting: Family Medicine

## 2019-07-06 ENCOUNTER — Telehealth: Payer: Self-pay

## 2019-07-06 NOTE — Telephone Encounter (Signed)
Letter faxed today. Mr. Radach informed.

## 2019-07-07 ENCOUNTER — Telehealth: Payer: Self-pay

## 2019-07-07 NOTE — Telephone Encounter (Signed)
Note faxed.  Patient informed.

## 2019-07-13 ENCOUNTER — Encounter: Payer: Self-pay | Admitting: Critical Care Medicine

## 2019-07-13 ENCOUNTER — Other Ambulatory Visit: Payer: Self-pay | Admitting: Critical Care Medicine

## 2019-07-13 DIAGNOSIS — M5442 Lumbago with sciatica, left side: Secondary | ICD-10-CM

## 2019-07-13 MED ORDER — GABAPENTIN 300 MG PO CAPS: 600.0000 mg | ORAL_CAPSULE | Freq: Three times a day (TID) | ORAL | 3 refills | Status: DC

## 2019-07-13 MED FILL — GABAPENTIN 300 MG CAPSULE: 300 | 15 days supply | Qty: 90 | Fill #0

## 2019-07-13 NOTE — Progress Notes (Signed)
Patient ID: Donald York, male   DOB: [birth year], 59 y.o.   MRN: [id number] Next is a 59 year old male the [place] comes in today with right leg and right knee pain he does have an MRI scheduled for 7 June and has seen orthopedics Dr. Ihor Gully.  He is on gabapentin and is needing refills of this.  On exam he does have tenderness in the lower back he does not have neurologic deficits  Impression is continued lumbar strain and need to follow-up with MRI  Plan is to prescribe gabapentin 600 mg 3 times daily and to keep his appointment with orthopedics I asked them to send a message to the doctor to get him back in and also the patient's been told to keep his appointment on June 7 for the MRI

## 2019-07-13 NOTE — Progress Notes (Signed)
Refill gabapentin 

## 2019-07-14 ENCOUNTER — Other Ambulatory Visit: Payer: Self-pay | Admitting: Critical Care Medicine

## 2019-07-14 MED ORDER — GABAPENTIN 300 MG PO CAPS: 600.0000 mg | ORAL_CAPSULE | Freq: Three times a day (TID) | ORAL | 0 refills | Status: DC

## 2019-07-14 MED FILL — GABAPENTIN 300 MG CAPSULE: 300 | 15 days supply | Qty: 90 | Fill #0

## 2019-07-15 NOTE — [facility] Nurse Program (Signed)
Donald York  c/o R Hip pain #10. Scheduled for MRI on 6/7 3p. CN picked up and delivered Rx to patient.

## 2019-08-01 ENCOUNTER — Ambulatory Visit
Admission: RE | Admit: 2019-08-01 | Discharge: 2019-08-01 | Disposition: A | Discharge status: Home / Self Care | Payer: Self-pay | Source: Ambulatory Visit | Attending: Orthopedic Surgery | Admitting: Orthopedic Surgery

## 2019-08-01 DIAGNOSIS — M545 Low back pain, unspecified: Secondary | ICD-10-CM

## 2019-08-01 IMAGING — MR MR LUMBAR SPINE W/O CM
5 series · 37 of 48 positions shown · non-contrast
Comparison: CT abdomen/pelvis [DATE], radiographs of the lumbar
spine [DATE]

CLINICAL DATA: Low back pain, unspecified back pain laterality,
unspecified chronicity, unspecified whether sciatica present.
Additional history provided by scanning technologist: Patient
reports low back pain radiating into right leg, posterior right leg
numbness, symptoms for years.

EXAM:
MRI LUMBAR SPINE WITHOUT CONTRAST
TECHNIQUE: Multiplanar, multisequence MR imaging of the lumbar spine was
performed. No intravenous contrast was administered.

[Series 3: T2 post-contrast · sagittal · 4.0mm · 0.88mm/px · 7 of 15 slices shown]
[im 1/15]
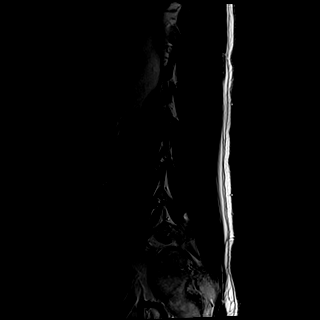
[im 3/15]
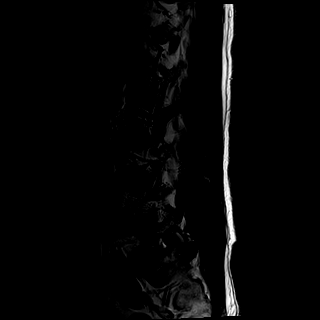
[im 5/15]
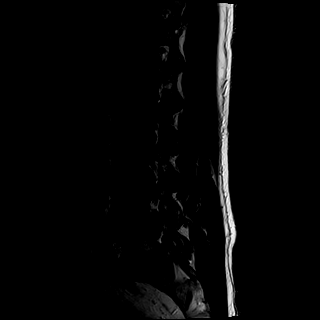
[im 8/15]
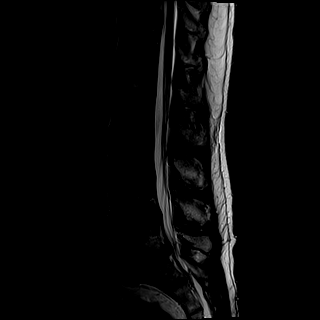
[im 10/15]
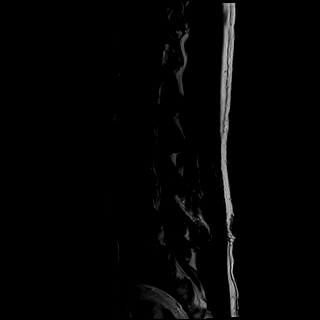
[im 12/15]
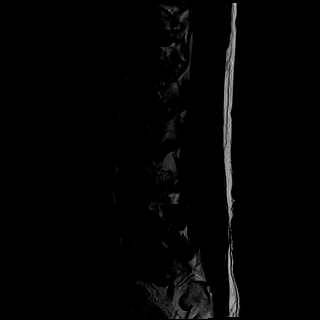
[im 15/15]
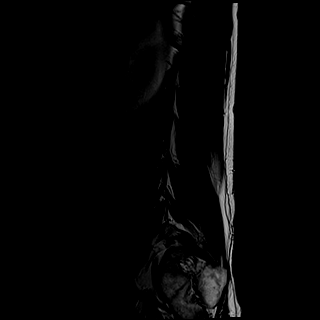

[Series 4: T1 · sagittal · 4.0mm · 0.88mm/px · 7 of 15 slices shown (1 of 2)]
[im 1/15]
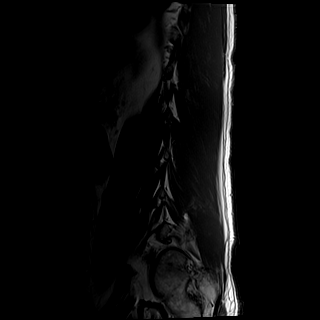
[im 3/15]
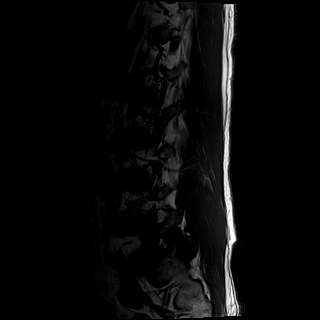
[im 5/15]
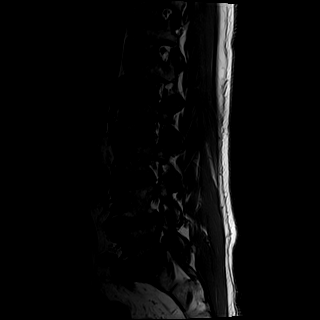
[im 8/15]
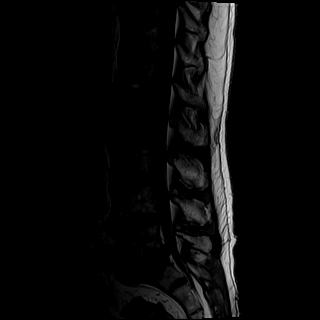
[im 10/15]
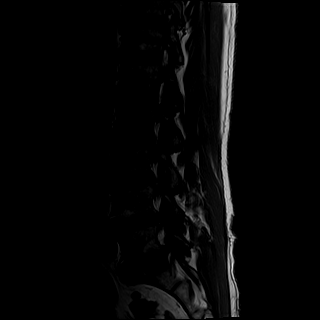
[im 12/15]
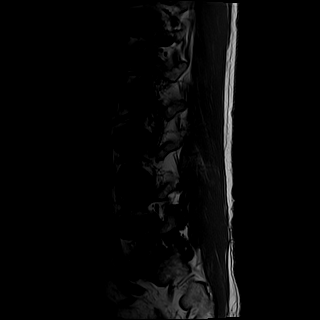
[im 15/15]
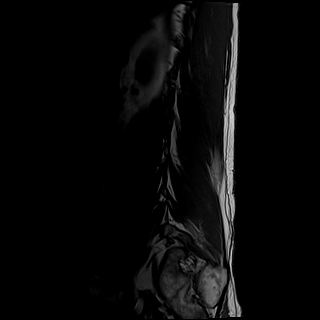

[Series 5: tirm sag · sagittal · 4.0mm · 0.55mm/px · 4 of 15 slices shown]
[im 1/15]
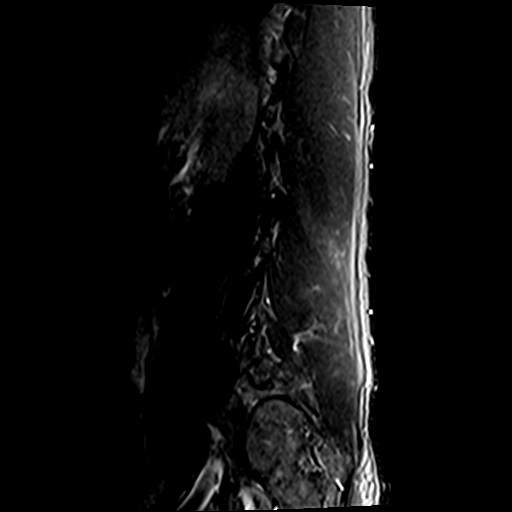
[im 3/15]
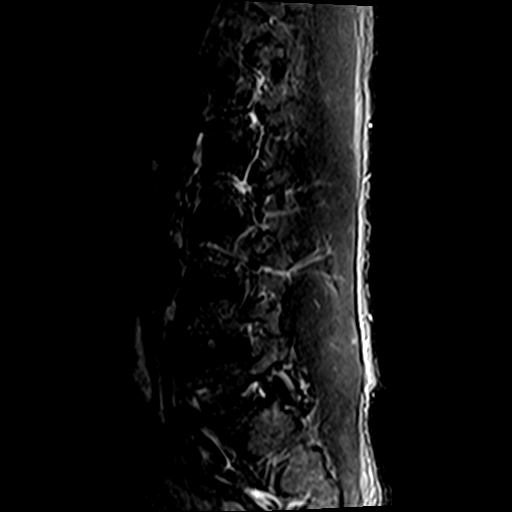
[im 5/15]
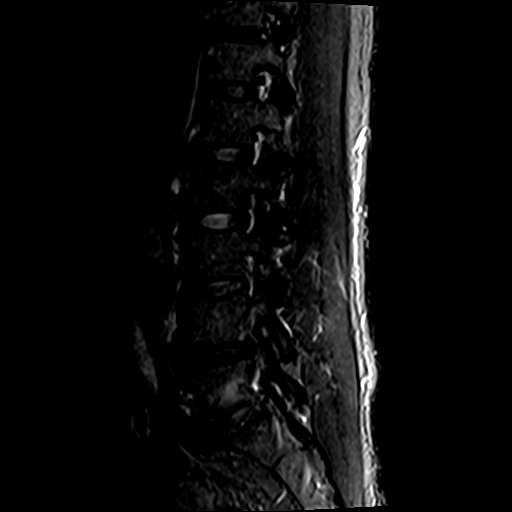
[im 8/15]
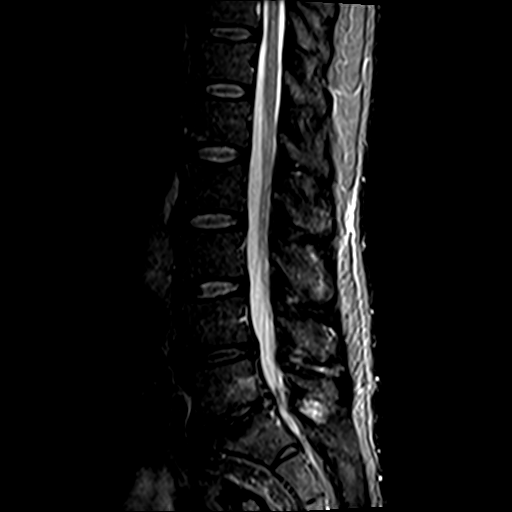

[Series 7: T2 · axial · 4.0mm · 0.78mm/px · z∈[-82,+137]mm · 9 of 19 slices shown]
[im 1/19]
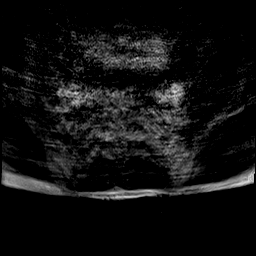
[im 3/19]
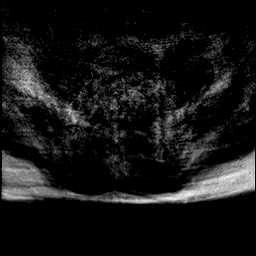
[im 5/19]
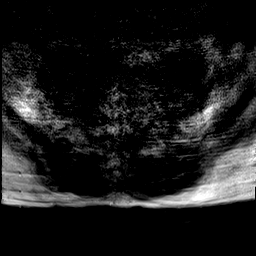
[im 7/19]
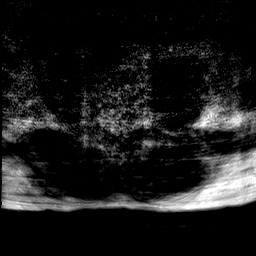
[im 10/19]
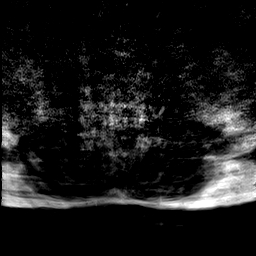
[im 12/19]
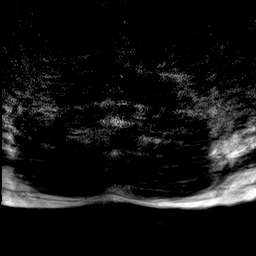
[im 14/19]
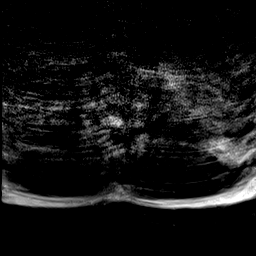
[im 16/19]
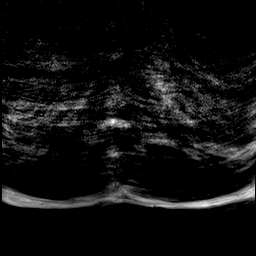
[im 19/19]
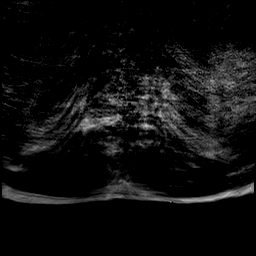

[Series 8: T1 · axial · 4.0mm · 0.78mm/px · z∈[-72,+102]mm · 10 of 37 slices shown (2 of 2)]
[im 3/37]
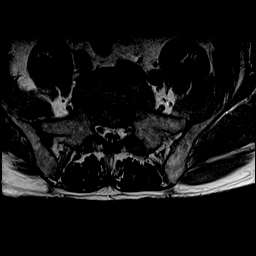
[im 7/37]
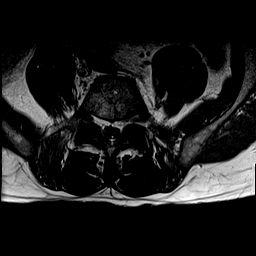
[im 11/37]
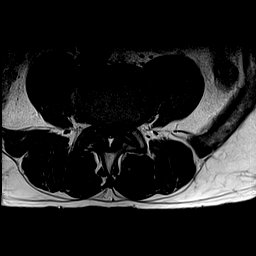
[im 15/37]
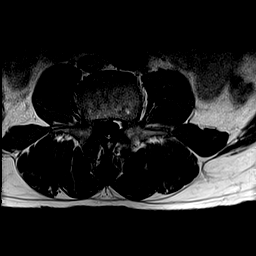
[im 20/37]
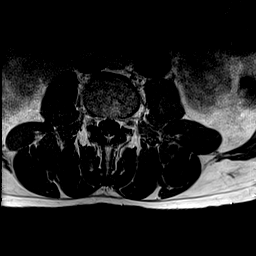
[im 22/37]
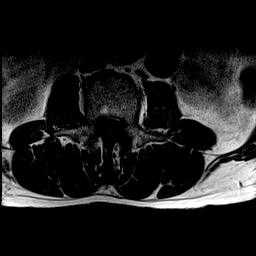
[im 26/37]
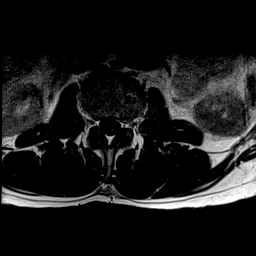
[im 30/37]
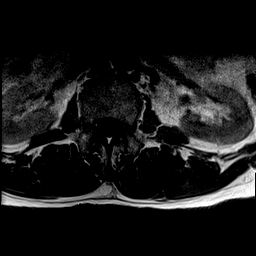
[im 32/37]
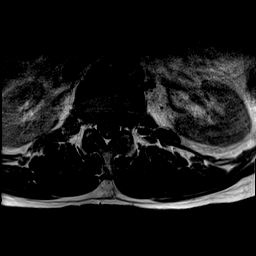
[im 34/37]
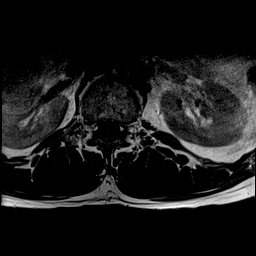

[37 of 48 positions shown; findings below may reference images not displayed]

FINDINGS: Despite the technologist best efforts, the axial T2 weighted
sequence is severely motion degraded and essentially nondiagnostic.
This limits evaluation for spinal canal stenosis and nerve root
impingement.

Segmentation: 5 lumbar vertebrae when correlating with prior CT
abdomen/pelvis [DATE]

Alignment: Straightening of the expected lumbar lordosis. No
significant spondylolisthesis.

Vertebrae: Vertebral body height is maintained. L4-L5 and L5-S1
degenerative endplate irregularity. Small Schmorl node within the S1
superior endplate. Mild degenerative endplate edema at L5-S1.

Conus medullaris and cauda equina: Conus extends to the L1-L2 level.
No signal abnormality is identified within the visualized distal
spinal cord. Thin T1 hyperintensity within the midline dorsal thecal
sac likely reflecting a small fibrolipoma of the filum terminale.

Paraspinal and other soft tissues: No abnormality is identified
within included portions of the abdomen/retroperitoneum, although
assessment is significant limited by severe motion degradation of
the axial T2 weighted sequence. Nonspecific edema signal within the
L4-L5 interspinous space and immediately beneath the L5 spinous
process.

Disc levels:

Multilevel disc degeneration. Most notably there is moderate disc
degeneration at the L4-L5 and L5-S1 levels.

Congenitally narrow lower lumbar spinal canal.

T11-T12: Shallow disc bulge. Facet arthrosis. No more than mild
spinal canal stenosis. No significant foraminal narrowing.

T12-L1: Facet arthrosis/ligamentum flavum hypertrophy. No
appreciable significant disc herniation or compressive canal
stenosis. Neural foramina patent.

L1-L2: Facet arthrosis/ligamentum flavum hypertrophy. No appreciable
significant disc herniation or compressive canal stenosis. Neural
foramina patent.

L2-L3: Small disc bulge. Facet arthrosis/ligamentum flavum
hypertrophy. Apparent mild bilateral subarticular narrowing without
definite nerve root impingement. Central canal patent. No
significant foraminal stenosis.

L3-L4: Facet arthrosis/ligamentum flavum hypertrophy. No significant
disc herniation. Apparent mild left greater than right subarticular
narrowing without definite nerve root impingement. Central canal
patent. No significant foraminal stenosis.

L4-L5: Disc bulge with endplate spurring. Moderate facet
arthrosis/ligamentum flavum hypertrophy. Bilateral subarticular
narrowing with likely encroachment upon the bilateral descending L5
nerve roots. Apparent moderate central canal stenosis. Mild
bilateral neural foraminal narrowing.

L5-S1: Disc bulge with endplate spurring. Superimposed broad-based
disc protrusion eccentric to the right within the right
subarticular, central and left subarticular zones. Moderate facet
arthrosis/ligamentum flavum hypertrophy. Severe right greater than
left subarticular stenosis with encroachment upon the right greater
than left descending S1 nerve roots. Apparent moderate to moderately
severe central canal stenosis. Moderate bilateral neural foraminal
narrowing.
IMPRESSION: The axial T2 weighted sequence is severely motion degraded and
essentially nondiagnostic. This limits evaluation of the spinal
canal and limits assessment for nerve root impingement.

Lumbar spondylosis as outlined and most notably as follows.

At L5-S1, there is moderate disc degeneration. Disc bulge with
endplate spurring. Superimposed broad-based disc protrusion
eccentric to the right encompassing the right subarticular, central
and left subarticular zones. Moderate facet arthrosis/ligamentum
flavum hypertrophy. Severe right greater than left subarticular
stenosis with encroachment upon the right greater than left
descending S1 nerve roots. Apparent moderate to moderately severe
central canal stenosis. Moderate bilateral neural foraminal
narrowing.

At L4-L5, there is moderate disc degeneration. Disc bulge with
endplate spurring. Moderate facet arthrosis/ligamentum flavum
hypertrophy. Bilateral subarticular narrowing with likely
encroachment upon the descending L5 nerve roots. Apparent moderate
central canal stenosis. Mild bilateral neural foraminal narrowing.

## 2019-08-24 ENCOUNTER — Encounter: Payer: Self-pay | Admitting: Critical Care Medicine

## 2019-08-25 NOTE — Progress Notes (Signed)
Patient ID: Donald York, male   DOB: 08/29/1960, 59 y.o.   MRN: [id number] This patient was seen in follow-up with the [place] [facility] and has significant lumbar disc disease is needing follow-up with orthopedics but he has yet to follow-up with him.  His MRI visit was reviewed and he has multilevel disease and may benefit from an injection of steroids.  While the patient was with me in the office I called orthopedics and made an appointment for him and they will connect with him on this

## 2019-09-01 MED FILL — predniSONE 5 MG TABS: 5 | 12 days supply | Qty: 48 | Fill #0

## 2019-09-14 ENCOUNTER — Other Ambulatory Visit: Payer: Self-pay | Admitting: Critical Care Medicine

## 2019-09-14 MED ORDER — PREDNISONE 10 MG PO TABS: ORAL_TABLET | ORAL | 0 refills | Status: DC

## 2019-09-15 ENCOUNTER — Encounter: Payer: Self-pay | Admitting: Critical Care Medicine

## 2019-09-15 MED FILL — predniSONE 10 MG TABS: 10 | 30 days supply | Qty: 60 | Fill #0

## 2019-09-15 NOTE — Progress Notes (Signed)
I took a phone call from this patient who indicated he had a prescription for prednisone given to him by orthopedics but he could not fill this as he could not afford the medication.  He has severe lower back pain with MRI findings consistent with lumbar disc disease.  He is a candidate for cortisone injections account for these at this time.  My plan will be to give this patient prednisone as a pulse and taper dose and will see if he can provide for him patient assistance for the patient assistance find to get the back injections accomplished

## 2019-09-21 ENCOUNTER — Telehealth: Payer: Self-pay | Admitting: Family Medicine

## 2019-09-21 ENCOUNTER — Other Ambulatory Visit: Payer: Self-pay | Admitting: Critical Care Medicine

## 2019-09-21 MED ORDER — PREDNISONE 10 MG PO TABS: ORAL_TABLET | ORAL | 0 refills | Status: DC

## 2019-09-21 NOTE — Telephone Encounter (Signed)
I spoke to the patient and pred refill sent

## 2019-09-21 NOTE — Telephone Encounter (Signed)
Please advise.   Copied from [place] [id number]. Topic: General - Other >> Sep 20, 2019  1:49 PM Alanda Slim E wrote: Reason for CRM: pt asked for a call from Dr Joya Gaskins about an rx/ pleaseaadvise

## 2019-09-30 ENCOUNTER — Ambulatory Visit: Payer: Self-pay | Admitting: Family Medicine

## 2019-10-04 ENCOUNTER — Ambulatory Visit (INDEPENDENT_AMBULATORY_CARE_PROVIDER_SITE_OTHER): Payer: Self-pay | Admitting: Family Medicine

## 2019-10-04 ENCOUNTER — Other Ambulatory Visit: Payer: Self-pay | Admitting: Critical Care Medicine

## 2019-10-04 ENCOUNTER — Encounter: Payer: Self-pay | Admitting: Family Medicine

## 2019-10-04 ENCOUNTER — Other Ambulatory Visit: Payer: Self-pay

## 2019-10-04 VITALS — BP 106/63 | HR 81 | Temp 97.7°F | Resp 17 | Ht 72.0 in | Wt 168.0 lb

## 2019-10-04 DIAGNOSIS — Z8673 Personal history of transient ischemic attack (TIA), and cerebral infarction without residual deficits: Secondary | ICD-10-CM

## 2019-10-04 DIAGNOSIS — E559 Vitamin D deficiency, unspecified: Secondary | ICD-10-CM

## 2019-10-04 DIAGNOSIS — G8929 Other chronic pain: Secondary | ICD-10-CM

## 2019-10-04 DIAGNOSIS — Z09 Encounter for follow-up examination after completed treatment for conditions other than malignant neoplasm: Secondary | ICD-10-CM

## 2019-10-04 DIAGNOSIS — M5442 Lumbago with sciatica, left side: Secondary | ICD-10-CM

## 2019-10-04 DIAGNOSIS — M5441 Lumbago with sciatica, right side: Secondary | ICD-10-CM

## 2019-10-04 MED ORDER — VITAMIN D (ERGOCALCIFEROL) 1.25 MG (50000 UNIT) PO CAPS: 50000.0000 [IU] | ORAL_CAPSULE | ORAL | 6 refills | Status: DC

## 2019-10-04 MED ORDER — CLOPIDOGREL BISULFATE 75 MG PO TABS: 75.0000 mg | ORAL_TABLET | Freq: Every day | ORAL | 6 refills | Status: DC

## 2019-10-04 MED ORDER — GABAPENTIN 300 MG PO CAPS: 300.0000 mg | ORAL_CAPSULE | Freq: Three times a day (TID) | ORAL | 6 refills | Status: DC

## 2019-10-04 NOTE — Progress Notes (Signed)
Patient Donald York   Established Patient Office Visit  Subjective:  Patient ID: Donald York, male    DOB: [birth year]  Age: 59 y.o. MRN: [id number]  CC: No chief complaint on file.   HPI Donald York is a 59 year old male who presents for Follow Up today.   Patient Active Problem List   Diagnosis Date Noted  . Chronic bilateral low back pain with bilateral sciatica 06/29/2019  . Right leg pain 05/01/2019  . Temporal arteritis ([place]) 04/05/2019  . History of stroke 04/05/2019  . Chronic left-sided headaches 04/05/2019  . Shortness of breath 04/05/2019  . Stroke-like symptom 02/01/2019  . Numbness and tingling of both legs 02/01/2019  . Dizziness 02/01/2019  . Alcohol use 02/01/2019  . Duodenal ulcer 05/25/2018  . AKI (acute kidney injury) ([place]) 06/09/2016  . Tobacco abuse 06/09/2016  . Cocaine use 06/09/2016    Past Medical History:  Diagnosis Date  . Alcohol use   . Cocaine use   . CVA (cerebral vascular accident) ([place])   . Dizziness 11/2018  . Family history of adverse reaction to anesthesia    " [name] "  . Peri-rectal abscess 06/09/2016  . PUD (peptic ulcer disease)   . Tobacco use   . Vitamin D deficiency 01/2019   Current Status: Since his last office visit, he is doing well with no complaints. 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 He states that he is now working extended hours lately and does not have time to pick up medications, especially since he is traveling on the bus line. He states that he has not taken any medications as prescribed. He denies fevers, chills, fatigue, recent infections, weight loss, and night sweats. He has not had any visual changes, and falls. No chest pain, heart palpitations, cough and shortness of breath reported. Denies GI problems such as nausea, vomiting, diarrhea, and constipation. He has no reports of blood in stools, dysuria and hematuria. No depression or anxiety  reported today. He is taking all medications as prescribed. He denies pain today.   Past Surgical History:  Procedure Laterality Date  . NO PAST SURGERIES      Family History  Problem Relation Age of Onset  . Lung cancer Father   . Cancer Maternal Uncle     Social History   Socioeconomic History  . Marital status: Single    Spouse name: Not on file  . Number of children: Not on file  . Years of education: Not on file  . Highest education level: Not on file  Occupational History    Comment: Construction  Tobacco Use  . Smoking status: Current Some Day Smoker    Packs/day: 0.50    Types: Cigarettes  . Smokeless tobacco: Never Used  Vaping Use  . Vaping Use: Never used  Substance and Sexual Activity  . Alcohol use: Yes    Comment: 40 oz beer, every now and then  . Drug use: Yes    Types: Cocaine, Marijuana  . Sexual activity: Not Currently  Other Topics Concern  . Not on file  Social History Narrative  . Not on file   Social Determinants of Health   Financial Resource Strain:   . Difficulty of Paying Living Expenses:   Food Insecurity:   . Worried About Charity fundraiser in the Last Year:   . Arboriculturist in the Last Year:   [employer]  Needs:   . Lack of Transportation (Medical):   [name] Lack of Transportation (Non-Medical):   Physical Activity:   . Days of Exercise per Week:   . Minutes of Exercise per Session:   Stress:   . Feeling of Stress :   Social Connections:   . Frequency of Communication with Friends and Family:   . Frequency of Social Gatherings with Friends and Family:   . Attends Religious Services:   . Active Member of Clubs or Organizations:   . Attends Archivist Meetings:   [name] Marital Status:   Intimate Partner Violence:   . Fear of Current or Ex-Partner:   . Emotionally Abused:   [name] Physically Abused:   . Sexually Abused:     Outpatient Medications Prior to Visit  Medication Sig Dispense Refill  . aspirin EC 325 MG  tablet Take 325 mg by mouth daily.    [name] gabapentin (NEURONTIN) 300 MG capsule Take 2 capsules (600 mg total) by mouth 3 (three) times daily. 90 capsule 0  . Vitamin D, Ergocalciferol, (DRISDOL) 1.25 MG (50000 UNIT) CAPS capsule Take 1 capsule (50,000 Units total) by mouth every 7 (seven) days. 5 capsule 6  . clopidogrel (PLAVIX) 75 MG tablet Take 1 tablet (75 mg total) by mouth daily. (Patient not taking: Reported on 10/04/2019) 90 tablet 6  . predniSONE (DELTASONE) 10 MG tablet Take 6 a day for 2 days, 5 a day for 2 days, 4 a day for 2 days, 3 a day for 2 days,  2 a day for 2 days then  1 a day and stay 100 tablet 0   No facility-administered medications prior to visit.    No Known Allergies  ROS Review of Systems  Constitutional: Negative.   HENT: Negative.   Eyes: Negative.   Respiratory: Negative.   Cardiovascular: Negative.   Gastrointestinal: Negative.   Endocrine: Negative.   Genitourinary: Negative.   Musculoskeletal: Negative.   Skin: Negative.   Allergic/Immunologic: Negative.   Neurological: Positive for dizziness (occasional ) and headaches (occasional ).  Hematological: Negative.   Psychiatric/Behavioral: Negative.       Objective:    Physical Exam Vitals and nursing note reviewed.  Constitutional:      Appearance: Normal appearance.  HENT:     Head: Normocephalic and atraumatic.     Nose: Nose normal.     Mouth/Throat:     Mouth: Mucous membranes are moist.     Pharynx: Oropharynx is clear.  Cardiovascular:     Rate and Rhythm: Normal rate and regular rhythm.     Pulses: Normal pulses.     Heart sounds: Normal heart sounds.  Pulmonary:     Effort: Pulmonary effort is normal.     Breath sounds: Normal breath sounds.  Abdominal:     General: Bowel sounds are normal.     Palpations: Abdomen is soft.  Musculoskeletal:        General: Normal range of motion.     Cervical back: Normal range of motion and neck supple.  Skin:    General: Skin is warm and  dry.  Neurological:     General: No focal deficit present.     Mental Status: He is alert and oriented to person, place, and time.  Psychiatric:        Mood and Affect: Mood normal.        Behavior: Behavior normal.        Thought Content: Thought content normal.  Judgment: Judgment normal.     BP 106/63 (BP Location: Right Arm, Patient Position: Sitting, Cuff Size: Normal)   Pulse 81   Temp 97.7 F (36.5 C)   Resp 17   Ht 6' (1.829 m)   Wt 168 lb (76.2 kg)   SpO2 99%   BMI 22.78 kg/m  Wt Readings from Last 3 Encounters:  10/04/19 168 lb (76.2 kg)  06/29/19 169 lb 12.8 oz (77 kg)  06/13/19 169 lb (76.7 kg)     Health Maintenance Due  Topic Date Due  . Hepatitis C Screening  Never done  . COVID-19 Vaccine (1) Never done  . COLONOSCOPY  Never done  . INFLUENZA VACCINE  09/25/2019    There are no preventive York reminders to display for this patient.  Lab Results  Component Value Date   TSH 1.780 01/31/2019   Lab Results  Component Value Date   WBC 14.9 (H) 12/20/2018   HGB 15.3 12/21/2018   HCT 45.0 12/21/2018   MCV 92.7 12/20/2018   PLT 247 12/20/2018   Lab Results  Component Value Date   NA 139 12/21/2018   K 3.6 12/21/2018   CO2 23 12/20/2018   GLUCOSE 111 (H) 12/21/2018   BUN 10 12/21/2018   CREATININE 1.40 (H) 12/21/2018   BILITOT 0.3 12/20/2018   ALKPHOS 43 12/20/2018   AST 22 12/20/2018   ALT 19 12/20/2018   PROT 5.1 (L) 12/20/2018   ALBUMIN 3.1 (L) 12/20/2018   CALCIUM 8.5 (L) 12/20/2018   ANIONGAP 10 12/20/2018   No results found for: CHOL No results found for: HDL No results found for: LDLCALC No results found for: TRIG No results found for: [facility] Lab Results  Component Value Date   HGBA1C 5.2 01/31/2019      Assessment & Plan:   1. History of stroke No signs or symptoms of recurrence noted or reported. Monitor.   2. Chronic bilateral low back pain with bilateral sciatica  3. Vitamin D deficiency  4. Follow up He  will follow up in 3 months.   Meds ordered this encounter  Medications  . DISCONTD: gabapentin (NEURONTIN) 300 MG capsule    Sig: Take 1 capsule (300 mg total) by mouth 3 (three) times daily.    Dispense:  90 capsule    Refill:  6    Nurse lost medications in transport, please refill and nurse will purchase out of her funds  . DISCONTD: Vitamin D, Ergocalciferol, (DRISDOL) 1.25 MG (50000 UNIT) CAPS capsule    Sig: Take 1 capsule (50,000 Units total) by mouth every 7 (seven) days.    Dispense:  5 capsule    Refill:  6    **[place]**  . DISCONTD: clopidogrel (PLAVIX) 75 MG tablet    Sig: Take 1 tablet (75 mg total) by mouth daily.    Dispense:  90 tablet    Refill:  6    **[place]**   No orders of the defined types were placed in this encounter.   Referral Orders  No referral(s) requested today   Kathe Becton,  MSN, FNP-BC [place], [place] [address] [phone] fax   Problem List Items Addressed This Visit      Nervous and Auditory   Chronic bilateral low back pain with bilateral sciatica     Other   History of stroke - Primary    Other Visit  Diagnoses    Vitamin D deficiency       Follow up          Meds ordered this encounter  Medications  . DISCONTD: gabapentin (NEURONTIN) 300 MG capsule    Sig: Take 1 capsule (300 mg total) by mouth 3 (three) times daily.    Dispense:  90 capsule    Refill:  6    Nurse lost medications in transport, please refill and nurse will purchase out of her funds  . DISCONTD: Vitamin D, Ergocalciferol, (DRISDOL) 1.25 MG (50000 UNIT) CAPS capsule    Sig: Take 1 capsule (50,000 Units total) by mouth every 7 (seven) days.    Dispense:  5 capsule    Refill:  6    **[place]**  . DISCONTD: clopidogrel (PLAVIX) 75 MG tablet    Sig: Take 1 tablet (75 mg  total) by mouth daily.    Dispense:  90 tablet    Refill:  6    **[place]**    Follow-up: No follow-ups on file.    Azzie Glatter, FNP

## 2019-10-04 NOTE — Progress Notes (Signed)
refills  

## 2019-10-05 MED FILL — CLOPIDOGREL 75 MG TABLET: 75 | 30 days supply | Qty: 30 | Fill #0

## 2019-10-05 MED FILL — GABAPENTIN 300 MG CAPSULE: 300 | 30 days supply | Qty: 90 | Fill #0

## 2019-10-05 MED FILL — VIT D2 1.25 MG (50,000 UNIT: 1.25 MG | 28 days supply | Qty: 4 | Fill #0

## 2019-10-06 ENCOUNTER — Encounter: Payer: Self-pay | Admitting: Family Medicine

## 2019-11-04 ENCOUNTER — Ambulatory Visit (HOSPITAL_COMMUNITY): Payer: Self-pay | Attending: Cardiovascular Disease

## 2019-11-04 ENCOUNTER — Other Ambulatory Visit: Payer: Self-pay

## 2019-11-04 DIAGNOSIS — I359 Nonrheumatic aortic valve disorder, unspecified: Secondary | ICD-10-CM

## 2019-11-04 LAB — ECHOCARDIOGRAM COMPLETE
AR max vel: 1.5 cm2
AV Area VTI: 1.53 cm2
AV Area mean vel: 1.64 cm2
AV Mean grad: 15 mmHg
AV Peak grad: 27.6 mmHg
Ao pk vel: 2.63 m/s
Area-P 1/2: 2.71 cm2
P 1/2 time: 582 msec
S' Lateral: 2.5 cm

## 2019-12-05 ENCOUNTER — Ambulatory Visit: Payer: Self-pay | Admitting: Family Medicine

## 2019-12-09 ENCOUNTER — Telehealth: Payer: Self-pay | Admitting: Cardiology

## 2019-12-09 NOTE — Telephone Encounter (Signed)
Patient brought in a form from [facility] for Dr. Stanford Breed to list diagnosis and sign and date it fax back to [phone] for [facility]. Gave to Dr. Jacalyn Lefevre nurse Hilda Blades .  Signed release attached with the form .12/09/19 fsw

## 2019-12-09 NOTE — Telephone Encounter (Signed)
Last office note and form faxed to [phone].

## 2019-12-22 ENCOUNTER — Telehealth (INDEPENDENT_AMBULATORY_CARE_PROVIDER_SITE_OTHER): Payer: Self-pay | Admitting: Cardiology

## 2019-12-22 ENCOUNTER — Encounter: Payer: Self-pay | Admitting: Cardiology

## 2019-12-22 VITALS — Ht 72.0 in | Wt 165.0 lb

## 2019-12-22 DIAGNOSIS — I359 Nonrheumatic aortic valve disorder, unspecified: Secondary | ICD-10-CM

## 2019-12-22 DIAGNOSIS — Z720 Tobacco use: Secondary | ICD-10-CM

## 2019-12-22 DIAGNOSIS — F1490 Cocaine use, unspecified, uncomplicated: Secondary | ICD-10-CM

## 2019-12-22 NOTE — Patient Instructions (Signed)

## 2019-12-22 NOTE — Progress Notes (Signed)
Virtual Visit via Video Note patient request.   This visit type was conducted due to national recommendations for restrictions regarding the COVID-19 Pandemic (e.g. social distancing) in an effort to limit this patient's exposure and mitigate transmission in our community.  Due to his co-morbid illnesses, this patient is at least at moderate risk for complications without adequate follow up.  This format is felt to be most appropriate for this patient at this time.  All issues noted in this document were discussed and addressed.  A limited physical exam was performed with this format.  Please refer to the patient's chart for his consent to telehealth for [facility].      Date:  12/22/2019   ID:  Donald York, DOB 04/26/1960, MRN [id number]  Patient Location:Home Provider Location: Home  PCP:  Azzie Glatter, FNP  Cardiologist:  Dr Stanford Breed  Evaluation Performed:  Follow-Up Visit  Chief Complaint:  FU AS  History of Present Illness:    FU AS. Patient with history of CVA. CTA of neck October 2020 showed no large vessel occlusion or high-grade stenosis.ABIs 4/21 normal. Echocardiogram 9/21 showed normal LV function, mild AS (mean gradient 15 mmHg) and moderate AI. Since last seen  patient does have dyspnea on exertion.  No orthopnea, PND, pedal edema.  He states he has an occasional sharp pain in his chest both with activities and at rest.  Lasts seconds to several minutes and resolves.  He has some dizziness with standing but has not had syncope.  The patient does not have symptoms concerning for COVID-19 infection (fever, chills, cough, or new shortness of breath).    Past Medical History:  Diagnosis Date  . Alcohol use   . Cocaine use   . CVA (cerebral vascular accident) ([place])   . Dizziness 11/2018  . Family history of adverse reaction to anesthesia    " [name] "  . Peri-rectal abscess 06/09/2016  . PUD (peptic ulcer disease)   . Tobacco use   . Vitamin D  deficiency 01/2019   Past Surgical History:  Procedure Laterality Date  . NO PAST SURGERIES       Current Meds  Medication Sig  . aspirin EC 325 MG tablet Take 325 mg by mouth daily.  [name] gabapentin (NEURONTIN) 300 MG capsule Take 1 capsule (300 mg total) by mouth 3 (three) times daily.  . Vitamin D, Ergocalciferol, (DRISDOL) 1.25 MG (50000 UNIT) CAPS capsule Take 1 capsule (50,000 Units total) by mouth every 7 (seven) days.     Allergies:   Patient has no known allergies.   Social History   Tobacco Use  . Smoking status: Current Some Day Smoker    Packs/day: 0.50    Types: Cigarettes  . Smokeless tobacco: Never Used  Vaping Use  . Vaping Use: Never used  Substance Use Topics  . Alcohol use: Yes    Comment: 40 oz beer, every now and then  . Drug use: Yes    Types: Cocaine, Marijuana     Family Hx: The patient's family history includes Cancer in his maternal uncle; Lung cancer in his father.  ROS:   Please see the history of present illness.    No Fever, chills  or productive cough All other systems reviewed and are negative.  Recent Labs: 01/31/2019: TSH 1.780    Wt Readings from Last 3 Encounters:  12/22/19 165 lb (74.8 kg)  10/04/19 168 lb (76.2 kg)  06/29/19 169 lb 12.8 oz (77 kg)  Objective:    Vital Signs:  Ht 6' (1.829 m)   Wt 165 lb (74.8 kg)   BMI 22.38 kg/m    VITAL SIGNS:  reviewed NAD Answers questions appropriately Normal affect Remainder of physical examination not performed (telehealth visit; coronavirus pandemic)  ASSESSMENT & PLAN:    1. Aortic stenosis/aortic insufficiency-most recent echocardiogram showed moderate AI and mild aortic stenosis.  Symptoms somewhat difficult to evaluate.  He does have some dyspnea on exertion but does not appear to be significantly changed.  Occasional sharp pain that does not sound cardiac.  Patient will likely require aortic valve replacement in the future and he understands.  I will see him back in 6  months to make sure that his symptoms are stable. 2. History of cocaine abuse-patient continues to use cocaine intermittently.  I discussed the importance of avoiding. 3. Tobacco abuse-patient counseled on discontinuing.  COVID-19 Education: The importance of social distancing was discussed today.  Time:   Today, I have spent 15 minutes with the patient with telehealth technology discussing the above problems.     Medication Adjustments/Labs and Tests Ordered: Current medicines are reviewed at length with the patient today.  Concerns regarding medicines are outlined above.   Tests Ordered: No orders of the defined types were placed in this encounter.   Medication Changes: No orders of the defined types were placed in this encounter.   Follow Up:  In Person in 6 month(s)  Signed, Kirk Ruths, MD  12/22/2019 8:35 AM    [place]

## 2020-02-03 ENCOUNTER — Encounter: Payer: Self-pay | Admitting: Nurse Practitioner

## 2020-02-03 ENCOUNTER — Ambulatory Visit (INDEPENDENT_AMBULATORY_CARE_PROVIDER_SITE_OTHER): Payer: Self-pay | Admitting: Nurse Practitioner

## 2020-02-03 ENCOUNTER — Other Ambulatory Visit: Payer: Self-pay

## 2020-02-03 VITALS — BP 108/70 | HR 83 | Temp 98.2°F | Resp 18 | Ht 72.0 in | Wt 171.4 lb

## 2020-02-03 DIAGNOSIS — Z202 Contact with and (suspected) exposure to infections with a predominantly sexual mode of transmission: Secondary | ICD-10-CM

## 2020-02-03 DIAGNOSIS — Z113 Encounter for screening for infections with a predominantly sexual mode of transmission: Secondary | ICD-10-CM

## 2020-02-03 DIAGNOSIS — M25572 Pain in left ankle and joints of left foot: Secondary | ICD-10-CM

## 2020-02-03 DIAGNOSIS — R0781 Pleurodynia: Secondary | ICD-10-CM

## 2020-02-03 MED ORDER — CEFTRIAXONE SODIUM 250 MG IJ SOLR
500.0000 mg | Freq: Once | INTRAMUSCULAR | Status: AC
  Administered 2020-02-03: 500 mg via INTRAMUSCULAR

## 2020-02-03 NOTE — Progress Notes (Signed)
[facility] General Follow Up Note  02/03/2020 Name: Donald York MRN: [id number] DOB: September 21, 1960 Donald York is a 59 y.o. year old male who sees Azzie Glatter, FNP for primary care. LCSW was initially consulted to assist patient in applying for Medicaid and disability.  Ongoing Intervention: Patient experiencing low income. Patient works, but has increasing health concerns and was recently injured at work. Patient would still like to apply for disability but has not been able to complete the application process with Social Security. Referred patient to [facility] for assistance.  Review of patient status, including review of consultants reports, relevant laboratory and other test results, and collaboration with appropriate care team members and the patient's provider was performed as part of comprehensive patient evaluation and provision of services.     Follow up Plan: 1. CSW available from clinic as needed.  Donald York, [facility] [phone]

## 2020-02-03 NOTE — Progress Notes (Signed)
Hurt L side at work x few wks ago, slight ache, reports hard to breathe at times, rib fracture concerns   -Recent STI exposure, requesting testing today    -Needs to know the status of paperwork that was dropped off x 1 week ago

## 2020-02-03 NOTE — Progress Notes (Signed)
[place], [place]  [address] Phone:  [phone]   Fax:  [phone]   Established Patient Office Visit  Subjective:  Patient ID: Donald York, male    DOB: 1961/01/29  Age: 59 y.o. MRN: [id number]  CC:  Chief Complaint  Patient presents with   Follow-up   Injury    Hurt L side at work x few wks ago, slight ache, reports hard to breathe at times, concerned possibly fractured ribs     Exposure to STD    HPI Donald York presents for acute visit. He  has a past medical history of Alcohol use, Cocaine use, CVA (cerebral vascular accident) ([place]), Dizziness (11/2018), Family history of adverse reaction to anesthesia, Peri-rectal abscess (06/09/2016), PUD (peptic ulcer disease), Tobacco use, and Vitamin D deficiency (01/2019).    He admits that was stepping over a trench. He admits that he had a mini stroke while stepping and he his foot went down into the trench and he landed on the wall. He admits that his left ankle was swollen. He has also been having Shortness of breath and chest pain. He was not seen by any provider. He admits that his ankle is slowly getting better.   He is having penile discharge, dysuria since last week. He denies oral lesions, sore throat, pelvic pain, nocturia any visible ulcers or lesions   Past Medical History:  Diagnosis Date   Alcohol use    Cocaine use    CVA (cerebral vascular accident) ([place])    Dizziness 11/2018   Family history of adverse reaction to anesthesia    " [name] "   Peri-rectal abscess 06/09/2016   PUD (peptic ulcer disease)    Tobacco use    Vitamin D deficiency 01/2019    Past Surgical History:  Procedure Laterality Date   NO PAST SURGERIES      Family History  Problem Relation Age of Onset   Lung cancer Father    Cancer Maternal Uncle     Social History   Socioeconomic History   Marital status: Single    Spouse name: Not on file   Number of  children: Not on file   Years of education: Not on file   Highest education level: Not on file  Occupational History    Comment: Architect  Tobacco Use   Smoking status: Current Some Day Smoker    Packs/day: 0.50    Types: Cigarettes   Smokeless tobacco: Never Used  Vaping Use   Vaping Use: Never used  Substance and Sexual Activity   Alcohol use: Yes    Comment: 40 oz beer, every now and then   Drug use: Yes    Types: Cocaine, Marijuana   Sexual activity: Not Currently  Other Topics Concern   Not on file  Social History Narrative   Not on file   Social Determinants of Health   Financial Resource Strain: Not on file  Food Insecurity: Not on file  Transportation Needs: Not on file  Physical Activity: Not on file  Stress: Not on file  Social Connections: Not on file  Intimate Partner Violence: Not on file    Outpatient Medications Prior to Visit  Medication Sig Dispense Refill   aspirin EC 325 MG tablet Take 325 mg by mouth daily.     clopidogrel (PLAVIX) 75 MG tablet Take 1 tablet (75 mg total) by mouth daily. (Patient not taking: No sig reported) 90 tablet  6   gabapentin (NEURONTIN) 300 MG capsule Take 1 capsule (300 mg total) by mouth 3 (three) times daily. (Patient not taking: Reported on 02/03/2020) 90 capsule 6   Vitamin D, Ergocalciferol, (DRISDOL) 1.25 MG (50000 UNIT) CAPS capsule Take 1 capsule (50,000 Units total) by mouth every 7 (seven) days. (Patient not taking: Reported on 02/03/2020) 5 capsule 6   No facility-administered medications prior to visit.    No Known Allergies  ROS Review of Systems  Psychiatric/Behavioral:       Insomnia  Increased stress       Objective:    Physical Exam HENT:     Head: Normocephalic and atraumatic.  Cardiovascular:     Rate and Rhythm: Normal rate and regular rhythm.     Pulses: Normal pulses.     Heart sounds: Normal heart sounds.  Pulmonary:     Effort: Pulmonary effort is normal.     Breath  sounds: Normal breath sounds.  Musculoskeletal:        General: Normal range of motion.     Cervical back: Normal range of motion.     Comments: Tenderness to left ankle   Skin:    General: Skin is warm.     Capillary Refill: Capillary refill takes less than 2 seconds.  Neurological:     General: No focal deficit present.     Mental Status: He is alert and oriented to person, place, and time.  Psychiatric:        Mood and Affect: Mood normal.        Behavior: Behavior normal.        Thought Content: Thought content normal.        Judgment: Judgment normal.     BP 108/70 (BP Location: Left Arm, Patient Position: Sitting, Cuff Size: Normal)    Pulse 83    Temp 98.2 F (36.8 C) (Temporal)    Resp 18    Ht 6' (1.829 m)    Wt 171 lb 6.4 oz (77.7 kg)    SpO2 98%    BMI 23.25 kg/m  Wt Readings from Last 3 Encounters:  02/03/20 171 lb 6.4 oz (77.7 kg)  12/22/19 165 lb (74.8 kg)  10/04/19 168 lb (76.2 kg)     Health Maintenance Due  Topic Date Due   Hepatitis C Screening  Never done   COLONOSCOPY  Never done    There are no preventive care reminders to display for this patient.  Lab Results  Component Value Date   TSH 1.780 01/31/2019   Lab Results  Component Value Date   WBC 14.9 (H) 12/20/2018   HGB 15.3 12/21/2018   HCT 45.0 12/21/2018   MCV 92.7 12/20/2018   PLT 247 12/20/2018   Lab Results  Component Value Date   NA 139 12/21/2018   K 3.6 12/21/2018   CO2 23 12/20/2018   GLUCOSE 111 (H) 12/21/2018   BUN 10 12/21/2018   CREATININE 1.40 (H) 12/21/2018   BILITOT 0.3 12/20/2018   ALKPHOS 43 12/20/2018   AST 22 12/20/2018   ALT 19 12/20/2018   PROT 5.1 (L) 12/20/2018   ALBUMIN 3.1 (L) 12/20/2018   CALCIUM 8.5 (L) 12/20/2018   ANIONGAP 10 12/20/2018   No results found for: CHOL No results found for: HDL No results found for: LDLCALC No results found for: TRIG No results found for: [facility] Lab Results  Component Value Date   HGBA1C 5.2 01/31/2019       Assessment & Plan:  Problem List Items Addressed This Visit   None   Visit Diagnoses    Rib pain on left side    -  Primary Encourage patient to continue with current therapy X-rays pending additional treatment based on results   Relevant Orders   DG Ribs Unilateral W/Chest Left   Acute left ankle pain       Relevant Orders   DG Ankle Complete Left   Screening for STD (sexually transmitted disease)       Relevant Medications   cefTRIAXone (ROCEPHIN) injection 500 mg (Completed)   Other Relevant Orders   Chlamydia/Gonococcus/Trichomonas, NAA (Completed)   STD exposure       Relevant Orders   Chlamydia/Gonococcus/Trichomonas, NAA (Completed)      Meds ordered this encounter  Medications   cefTRIAXone (ROCEPHIN) injection 500 mg    Follow-up: No follow-ups on file.    Vevelyn Francois, NP

## 2020-02-09 LAB — CHLAMYDIA/GONOCOCCUS/TRICHOMONAS, NAA
Chlamydia by NAA: NEGATIVE
Gonococcus by NAA: POSITIVE — AB
Trich vag by NAA: NEGATIVE

## 2020-02-10 ENCOUNTER — Encounter: Payer: Self-pay | Admitting: Nurse Practitioner

## 2020-05-04 ENCOUNTER — Ambulatory Visit: Payer: Self-pay | Admitting: Family Medicine

## 2020-05-11 ENCOUNTER — Other Ambulatory Visit: Payer: Self-pay

## 2020-05-11 ENCOUNTER — Other Ambulatory Visit (HOSPITAL_COMMUNITY): Payer: Self-pay | Admitting: Family Medicine

## 2020-05-11 ENCOUNTER — Encounter: Payer: Self-pay | Admitting: Family Medicine

## 2020-05-11 ENCOUNTER — Ambulatory Visit (INDEPENDENT_AMBULATORY_CARE_PROVIDER_SITE_OTHER): Payer: Self-pay | Admitting: Family Medicine

## 2020-05-11 VITALS — BP 99/55 | HR 71 | Ht 72.0 in | Wt 165.0 lb

## 2020-05-11 DIAGNOSIS — Z7289 Other problems related to lifestyle: Secondary | ICD-10-CM

## 2020-05-11 DIAGNOSIS — R0981 Nasal congestion: Secondary | ICD-10-CM

## 2020-05-11 DIAGNOSIS — R2990 Unspecified symptoms and signs involving the nervous system: Secondary | ICD-10-CM

## 2020-05-11 DIAGNOSIS — Z09 Encounter for follow-up examination after completed treatment for conditions other than malignant neoplasm: Secondary | ICD-10-CM

## 2020-05-11 DIAGNOSIS — Z789 Other specified health status: Secondary | ICD-10-CM

## 2020-05-11 DIAGNOSIS — Z0000 Encounter for general adult medical examination without abnormal findings: Secondary | ICD-10-CM

## 2020-05-11 DIAGNOSIS — Z8673 Personal history of transient ischemic attack (TIA), and cerebral infarction without residual deficits: Secondary | ICD-10-CM

## 2020-05-11 DIAGNOSIS — Z1212 Encounter for screening for malignant neoplasm of rectum: Secondary | ICD-10-CM

## 2020-05-11 DIAGNOSIS — F1090 Alcohol use, unspecified, uncomplicated: Secondary | ICD-10-CM

## 2020-05-11 DIAGNOSIS — J329 Chronic sinusitis, unspecified: Secondary | ICD-10-CM

## 2020-05-11 DIAGNOSIS — Z1211 Encounter for screening for malignant neoplasm of colon: Secondary | ICD-10-CM

## 2020-05-11 MED ORDER — AMOXICILLIN-POT CLAVULANATE 875-125 MG PO TABS: 1.0000 | ORAL_TABLET | Freq: Two times a day (BID) | ORAL | 0 refills | Status: AC

## 2020-05-11 NOTE — Progress Notes (Signed)
Patient [name]  Sick Visit  Subjective:  Patient ID: Donald York, male    DOB: 02-16-1961  Age: 60 y.o. MRN: [id number]  CC:  Chief Complaint  Patient presents with  . Follow-up  . Sinus Problem    HPI ANOTHONY York is a 60 year old male who presents for Sick Visit today.   Patient Active Problem List   Diagnosis Date Noted  . Chronic bilateral low back pain with bilateral sciatica 06/29/2019  . Right leg pain 05/01/2019  . Temporal arteritis ([place]) 04/05/2019  . History of stroke 04/05/2019  . Chronic left-sided headaches 04/05/2019  . Shortness of breath 04/05/2019  . Stroke-like symptom 02/01/2019  . Numbness and tingling of both legs 02/01/2019  . Dizziness 02/01/2019  . Alcohol use 02/01/2019  . Duodenal ulcer 05/25/2018  . AKI (acute kidney injury) ([place]) 06/09/2016  . Tobacco abuse 06/09/2016  . Cocaine use 06/09/2016   Current Status: Since his last office visit, he is doing well with no complaints.  He has c/o increased sinus issues lately. He states that he has had episodes of mini-strokes. He denies severe headaches, dizziness, confusion, seizures, double vision, and blurred vision, nausea and vomiting. He denies fevers, chills, fatigue, recent infections, weight loss, and night sweats. Denies GI problems such as nausea, vomiting, diarrhea, and constipation. He has no reports of blood in stools, dysuria and hematuria. No depression or anxiety reported today. He is taking all medications as prescribed. He denies pain today.   Past Medical History:  Diagnosis Date  . Alcohol use   . Cocaine use   . CVA (cerebral vascular accident) ([place])   . Dizziness 11/2018  . Family history of adverse reaction to anesthesia    " [name] "  . Peri-rectal abscess 06/09/2016  . PUD (peptic ulcer disease)   . Tobacco use   . Vitamin D deficiency 01/2019    Past Surgical History:  Procedure Laterality Date  . NO PAST SURGERIES       Family History  Problem Relation Age of Onset  . Lung cancer Father   . Cancer Maternal Uncle     Social History   Socioeconomic History  . Marital status: Single    Spouse name: Not on file  . Number of children: Not on file  . Years of education: Not on file  . Highest education level: Not on file  Occupational History    Comment: Construction  Tobacco Use  . Smoking status: Current Some Day Smoker    Packs/day: 0.50    Types: Cigarettes  . Smokeless tobacco: Never Used  Vaping Use  . Vaping Use: Never used  Substance and Sexual Activity  . Alcohol use: Yes    Comment: 40 oz beer, every now and then  . Drug use: Yes    Types: Cocaine, Marijuana  . Sexual activity: Not Currently  Other Topics Concern  . Not on file  Social History Narrative  . Not on file   Social Determinants of Health   Financial Resource Strain: Not on file  Food Insecurity: Not on file  Transportation Needs: Not on file  Physical Activity: Not on file  Stress: Not on file  Social Connections: Not on file  Intimate Partner Violence: Not on file    Outpatient Medications Prior to Visit  Medication Sig Dispense Refill  . aspirin EC 325 MG tablet Take 325 mg by mouth daily.     No  facility-administered medications prior to visit.    No Known Allergies  ROS Review of Systems  Constitutional: Negative.   HENT: Positive for sinus pressure.   Respiratory: Negative.   Cardiovascular: Negative.   Gastrointestinal: Negative.   Genitourinary: Negative.   Musculoskeletal: Positive for arthralgias (generalized ).  Neurological: Positive for dizziness (occasional) and headaches (occasional ). Seizures: recent.  Psychiatric/Behavioral: Negative.    Objective:    Physical Exam Vitals and nursing note reviewed.  Constitutional:      Appearance: Normal appearance.  HENT:     Nose: Congestion present.  Cardiovascular:     Rate and Rhythm: Normal rate and regular rhythm.     Pulses:  Normal pulses.     Heart sounds: Normal heart sounds.  Pulmonary:     Effort: Pulmonary effort is normal.     Breath sounds: Normal breath sounds.  Neurological:     Mental Status: He is alert.  Psychiatric:        Mood and Affect: Mood normal.        Behavior: Behavior normal.        Thought Content: Thought content normal.        Judgment: Judgment normal.     BP (!) 99/55   Pulse 71   Ht 6' (1.829 m)   Wt 165 lb (74.8 kg)   SpO2 100%   BMI 22.38 kg/m  Wt Readings from Last 3 Encounters:  05/11/20 165 lb (74.8 kg)  02/03/20 171 lb 6.4 oz (77.7 kg)  12/22/19 165 lb (74.8 kg)     Health Maintenance Due  Topic Date Due  . Hepatitis C Screening  Never done  . COLONOSCOPY (Pts 45-20yrs Insurance coverage will need to be confirmed)  Never done  . COVID-19 Vaccine (3 - Booster for Moderna series) 02/19/2020    There are no preventive care reminders to display for this patient.  Lab Results  Component Value Date   TSH 1.780 01/31/2019   Lab Results  Component Value Date   WBC 14.9 (H) 12/20/2018   HGB 15.3 12/21/2018   HCT 45.0 12/21/2018   MCV 92.7 12/20/2018   PLT 247 12/20/2018   Lab Results  Component Value Date   NA 139 12/21/2018   K 3.6 12/21/2018   CO2 23 12/20/2018   GLUCOSE 111 (H) 12/21/2018   BUN 10 12/21/2018   CREATININE 1.40 (H) 12/21/2018   BILITOT 0.3 12/20/2018   ALKPHOS 43 12/20/2018   AST 22 12/20/2018   ALT 19 12/20/2018   PROT 5.1 (L) 12/20/2018   ALBUMIN 3.1 (L) 12/20/2018   CALCIUM 8.5 (L) 12/20/2018   ANIONGAP 10 12/20/2018   No results found for: CHOL No results found for: HDL No results found for: LDLCALC No results found for: TRIG No results found for: [facility] Lab Results  Component Value Date   HGBA1C 5.2 01/31/2019    Assessment & Plan:   1. Sinus congestion - amoxicillin-clavulanate (AUGMENTIN) 875-125 MG tablet; Take 1 tablet by mouth 2 (two) times daily for 7 days.  Dispense: 14 tablet; Refill: 0  2.  Stroke-like symptom Stable. He will follow up with Neurology.  - Ambulatory referral to Neurology  3. History of stroke Stable. No signs or symptoms of recurrence today. - Ambulatory referral to Neurology  4. Alcohol use  5. Sinusitis, unspecified chronicity, unspecified location - amoxicillin-clavulanate (AUGMENTIN) 875-125 MG tablet; Take 1 tablet by mouth 2 (two) times daily for 7 days.  Dispense: 14 tablet; Refill: 0  6.  Healthcare maintenance - Vitamin B12 - Vitamin D, 25-hydroxy - CBC with Differential - Comprehensive metabolic panel; Future - TSH - PSA - Lipid Panel  7. Screening for colorectal cancer - Ambulatory referral to Gastroenterology  8. Follow up He will follow up in 3 months.   Meds ordered this encounter  Medications  . amoxicillin-clavulanate (AUGMENTIN) 875-125 MG tablet    Sig: Take 1 tablet by mouth 2 (two) times daily for 7 days.    Dispense:  14 tablet    Refill:  0   Orders Placed This Encounter  Procedures  . Vitamin B12  . Vitamin D, 25-hydroxy  . CBC with Differential  . Comprehensive metabolic panel  . TSH  . PSA  . Lipid Panel  . Ambulatory referral to Neurology  . Ambulatory referral to Gastroenterology     Referral Orders     Ambulatory referral to Neurology     Ambulatory referral to Gastroenterology   Kathe Becton, MSN, ANE, FNP-BC [facility] [place], [place] [address] [phone]- fax    Problem List Items Addressed This Visit      Other   Alcohol use   History of stroke   Relevant Orders   Ambulatory referral to Neurology   Stroke-like symptom   Relevant Orders   Ambulatory referral to Neurology    Other Visit Diagnoses    Sinus congestion    -  Primary   Relevant Medications   amoxicillin-clavulanate (AUGMENTIN) 875-125 MG tablet   Sinusitis, unspecified chronicity, unspecified location        Relevant Medications   amoxicillin-clavulanate (AUGMENTIN) 875-125 MG tablet   Healthcare maintenance       Relevant Orders   Vitamin B12   Vitamin D, 25-hydroxy   CBC with Differential   Comprehensive metabolic panel   TSH   PSA   Lipid Panel   Screening for colorectal cancer       Relevant Orders   Ambulatory referral to Gastroenterology   Follow up          Meds ordered this encounter  Medications  . amoxicillin-clavulanate (AUGMENTIN) 875-125 MG tablet    Sig: Take 1 tablet by mouth 2 (two) times daily for 7 days.    Dispense:  14 tablet    Refill:  0    Follow-up: No follow-ups on file.    Azzie Glatter, FNP

## 2020-05-12 ENCOUNTER — Other Ambulatory Visit: Payer: Self-pay | Admitting: Family Medicine

## 2020-05-12 ENCOUNTER — Encounter: Payer: Self-pay | Admitting: Family Medicine

## 2020-05-12 ENCOUNTER — Other Ambulatory Visit (HOSPITAL_COMMUNITY): Payer: Self-pay | Admitting: Family Medicine

## 2020-05-12 DIAGNOSIS — E559 Vitamin D deficiency, unspecified: Secondary | ICD-10-CM

## 2020-05-12 LAB — LIPID PANEL
Chol/HDL Ratio: 3 ratio (ref 0.0–5.0)
Cholesterol, Total: 183 mg/dL (ref 100–199)
HDL: 61 mg/dL (ref >39)
LDL Chol Calc (NIH): 96 mg/dL (ref 0–99)
Triglycerides: 152 mg/dL — ABNORMAL HIGH (ref 0–149)
VLDL Cholesterol Cal: 26 mg/dL (ref 5–40)

## 2020-05-12 LAB — CBC WITH DIFFERENTIAL/PLATELET
Basophils Absolute: 0 10*3/uL (ref 0.0–0.2)
Basos: 1 %
EOS (ABSOLUTE): 0.2 10*3/uL (ref 0.0–0.4)
Eos: 2 %
Hematocrit: 46.6 % (ref 37.5–51.0)
Hemoglobin: 16.1 g/dL (ref 13.0–17.7)
Immature Grans (Abs): 0 10*3/uL (ref 0.0–0.1)
Immature Granulocytes: 0 %
Lymphocytes Absolute: 2.3 10*3/uL (ref 0.7–3.1)
Lymphs: 32 %
MCH: 30.7 pg (ref 26.6–33.0)
MCHC: 34.5 g/dL (ref 31.5–35.7)
MCV: 89 fL (ref 79–97)
Monocytes Absolute: 0.8 10*3/uL (ref 0.1–0.9)
Monocytes: 11 %
Neutrophils Absolute: 4 10*3/uL (ref 1.4–7.0)
Neutrophils: 54 %
Platelets: 300 10*3/uL (ref 150–450)
RBC: 5.24 x10E6/uL (ref 4.14–5.80)
RDW: 13.1 % (ref 11.6–15.4)
WBC: 7.4 10*3/uL (ref 3.4–10.8)

## 2020-05-12 LAB — VITAMIN B12: Vitamin B-12: 433 pg/mL (ref 232–1245)

## 2020-05-12 LAB — PSA: Prostate Specific Ag, Serum: 1.9 ng/mL (ref 0.0–4.0)

## 2020-05-12 LAB — TSH: TSH: 2.2 u[IU]/mL (ref 0.450–4.500)

## 2020-05-12 LAB — VITAMIN D 25 HYDROXY (VIT D DEFICIENCY, FRACTURES): Vit D, 25-Hydroxy: 15.9 ng/mL — ABNORMAL LOW (ref 30.0–100.0)

## 2020-05-12 MED ORDER — VITAMIN D (ERGOCALCIFEROL) 1.25 MG (50000 UNIT) PO CAPS: 50000.0000 [IU] | ORAL_CAPSULE | ORAL | 3 refills | Status: DC

## 2020-05-17 MED FILL — VIT D2 1.25 MG (50,000 UNIT: 1.25 MG | 28 days supply | Qty: 4 | Fill #0

## 2020-06-11 ENCOUNTER — Telehealth: Payer: Self-pay | Admitting: Cardiology

## 2020-06-11 NOTE — Telephone Encounter (Signed)
4.18.22 LVM on cell to sch fu appt w/Dr Larinda Buttery

## 2020-06-12 ENCOUNTER — Telehealth: Payer: Self-pay | Admitting: Cardiology

## 2020-06-12 NOTE — Telephone Encounter (Signed)
We will likely order echocardiogram at next office visit. Kirk Ruths

## 2020-06-12 NOTE — Telephone Encounter (Signed)
Called patient- advised that I did not see any current order, just a follow up appointment in May with Dr.Crenshaw. do you want another ECHO before this appointment?   Thank you!

## 2020-06-12 NOTE — Telephone Encounter (Signed)
Patient stated that he thought he had an ultrasound that needs to put done explain to patient dont see an order for it. Please advise if one need to be made for patient

## 2020-06-13 NOTE — Telephone Encounter (Signed)
Left message for patient with Dr Creshaw's recommendations.   

## 2020-07-09 NOTE — Progress Notes (Signed)
HPI: FU AS. Patient with history of CVA. CTAof neckOctober 2020 showed no large vessel occlusion or high-grade stenosis.ABIs 4/21 normal. Echocardiogram 9/21 showed normal LV function, mild AS (mean gradient 15 mmHg) and moderate AI. Since last seenhe notes dyspnea on exertion which he states has been present since his stroke.  Occasional sharp pain in his chest for 2 to 3 seconds.  Occasional dizziness.  He denies syncope.  No current outpatient medications on file.   No current facility-administered medications for this visit.     Past Medical History:  Diagnosis Date  . Alcohol use   . Cocaine use   . CVA (cerebral vascular accident) ([place])   . Dizziness 11/2018  . Family history of adverse reaction to anesthesia    " [name] "  . Peri-rectal abscess 06/09/2016  . PUD (peptic ulcer disease)   . Tobacco use   . Vitamin D deficiency 01/2019  . Vitamin D deficiency     Past Surgical History:  Procedure Laterality Date  . NO PAST SURGERIES      Social History   Socioeconomic History  . Marital status: Single    Spouse name: Not on file  . Number of children: Not on file  . Years of education: Not on file  . Highest education level: Not on file  Occupational History    Comment: Construction  Tobacco Use  . Smoking status: Current Some Day Smoker    Packs/day: 0.50    Types: Cigarettes  . Smokeless tobacco: Never Used  Vaping Use  . Vaping Use: Never used  Substance and Sexual Activity  . Alcohol use: Yes    Comment: 40 oz beer, every now and then  . Drug use: Yes    Types: Cocaine, Marijuana  . Sexual activity: Not Currently  Other Topics Concern  . Not on file  Social History Narrative  . Not on file   Social Determinants of Health   Financial Resource Strain: Not on file  Food Insecurity: Not on file  Transportation Needs: Not on file  Physical Activity: Not on file  Stress: Not on file  Social Connections: Not on file  Intimate Partner  Violence: Not on file    Family History  Problem Relation Age of Onset  . Lung cancer Father   . Cancer Maternal Uncle     ROS: no fevers or chills, productive cough, hemoptysis, dysphasia, odynophagia, melena, hematochezia, dysuria, hematuria, rash, seizure activity, orthopnea, PND, pedal edema, claudication. Remaining systems are negative.  Physical Exam: Well-developed well-nourished in no acute distress.  Skin is warm and dry.  HEENT is normal.  Neck is supple.  Chest is clear to auscultation with normal expansion.  Cardiovascular exam is regular rate and rhythm.  1/6 systolic and 2/6 diastolic murmur left sternal border. Abdominal exam nontender or distended. No masses palpated. Extremities show no edema. neuro grossly intact  ECG-sinus bradycardia at a rate of 57, cannot rule out septal infarct, early repolarization pattern.  Personally reviewed  A/P  1 aortic stenosis/aortic insufficiency-symptoms are somewhat difficult to assess.  He describes some dyspnea on exertion which he states is chronic since his CVA.  He has an occasional sharp pain that does not sound cardiac in his electrocardiogram shows no ST changes.  We will plan to repeat echocardiogram September 2022.  He understands he will likely require aortic valve replacement in the future.  2 history of cocaine abuse-we again discussed the importance of avoiding.  He  states he has not used in 6 months.  3 tobacco abuse-patient counseled on discontinuing.  4 history of CVA-resume aspirin 81 mg daily.  Kirk Ruths, MD

## 2020-07-18 ENCOUNTER — Encounter: Payer: Self-pay | Admitting: Cardiology

## 2020-07-18 ENCOUNTER — Ambulatory Visit (INDEPENDENT_AMBULATORY_CARE_PROVIDER_SITE_OTHER): Payer: Self-pay | Admitting: Cardiology

## 2020-07-18 ENCOUNTER — Other Ambulatory Visit: Payer: Self-pay

## 2020-07-18 VITALS — BP 100/50 | HR 57 | Ht 72.0 in | Wt 170.2 lb

## 2020-07-18 DIAGNOSIS — Z720 Tobacco use: Secondary | ICD-10-CM

## 2020-07-18 DIAGNOSIS — I359 Nonrheumatic aortic valve disorder, unspecified: Secondary | ICD-10-CM

## 2020-07-18 DIAGNOSIS — F1490 Cocaine use, unspecified, uncomplicated: Secondary | ICD-10-CM

## 2020-07-18 MED ORDER — ASPIRIN EC 81 MG PO TBEC: 81.0000 mg | DELAYED_RELEASE_TABLET | Freq: Every day | ORAL | 3 refills | Status: AC

## 2020-07-18 NOTE — Patient Instructions (Signed)
Medication Instructions:   START ASPIRIN 81 MG ONCE DAILY  *If you need a refill on your cardiac medications before your next appointment, please call your pharmacy*  Testing/Procedures:  Your physician has requested that you have an echocardiogram. Echocardiography is a painless test that uses sound waves to create images of your heart. It provides your doctor with information about the size and shape of your heart and how well your heart's chambers and valves are working. This procedure takes approximately one hour. There are no restrictions for this procedure.[place]     Follow-Up: At [facility], you and your health needs are our priority.  As part of our continuing mission to provide you with exceptional heart care, we have created designated Provider Care Teams.  These Care Teams include your primary Cardiologist (physician) and Advanced Practice Providers (APPs -  Physician Assistants and Nurse Practitioners) who all work together to provide you with the care you need, when you need it.  We recommend signing up for the patient portal called "MyChart".  Sign up information is provided on this After Visit Summary.  MyChart is used to connect with patients for Virtual Visits (Telemedicine).  Patients are able to view lab/test results, encounter notes, upcoming appointments, etc.  Non-urgent messages can be sent to your provider as well.   To learn more about what you can do with MyChart, go to [email or web address].    Your next appointment:   AFTER ECHO COMPLETED  The format for your next appointment:   In Person  Provider:   Kirk Ruths, MD

## 2020-08-13 ENCOUNTER — Encounter: Payer: Self-pay | Admitting: Nurse Practitioner

## 2020-08-13 ENCOUNTER — Ambulatory Visit (INDEPENDENT_AMBULATORY_CARE_PROVIDER_SITE_OTHER): Payer: Self-pay | Admitting: Nurse Practitioner

## 2020-08-13 ENCOUNTER — Other Ambulatory Visit: Payer: Self-pay

## 2020-08-13 ENCOUNTER — Other Ambulatory Visit: Payer: Self-pay | Admitting: Nurse Practitioner

## 2020-08-13 ENCOUNTER — Ambulatory Visit: Payer: Self-pay | Admitting: Family Medicine

## 2020-08-13 VITALS — BP 104/62 | HR 66 | Temp 97.9°F | Ht 72.0 in | Wt 169.0 lb

## 2020-08-13 DIAGNOSIS — R2990 Unspecified symptoms and signs involving the nervous system: Secondary | ICD-10-CM

## 2020-08-13 DIAGNOSIS — R0602 Shortness of breath: Secondary | ICD-10-CM

## 2020-08-13 DIAGNOSIS — R42 Dizziness and giddiness: Secondary | ICD-10-CM

## 2020-08-13 LAB — POCT GLYCOSYLATED HEMOGLOBIN (HGB A1C)
HbA1c POC (<> result, manual entry): 5.8 % (ref 4.0–5.6)
HbA1c, POC (controlled diabetic range): 5.8 % (ref 0.0–7.0)
HbA1c, POC (prediabetic range): 5.8 % (ref 5.7–6.4)
Hemoglobin A1C: 5.8 % — AB (ref 4.0–5.6)

## 2020-08-13 LAB — GLUCOSE, POCT (MANUAL RESULT ENTRY): POC Glucose: 94 mg/dl (ref 70–99)

## 2020-08-13 NOTE — Progress Notes (Signed)
[address], [place]  [address] Phone:  [phone]   Fax:  [phone]    Established Patient Office Visit  Subjective:  Patient ID: Donald York, male    DOB: 1960/12/11  Age: 60 y.o. MRN: [id number]  CC:  Chief Complaint  Patient presents with   Follow-up    Dizzy spells,feels like he starts to "wobble" sometime he is unable to move or speak becoming more frequent than last visit.  Requesting lab results last OV    HPI Donald York presents for follow up. A former patient of NP Stroud. He  has a past medical history of Alcohol use, Cocaine use, CVA (cerebral vascular accident) ([place]), Dizziness (11/2018), Family history of adverse reaction to anesthesia, Peri-rectal abscess (06/09/2016), PUD (peptic ulcer disease), Tobacco use, Vitamin D deficiency (01/2019), and Vitamin D deficiency.   He has some has had some left sided chest pain. He has blurred vision, "wobbly" . He does not feel like it dizziness.  He reports that this is ongoing however is getting worse.  He has a past history of CVA.  He feels like they are mini strokes.He is concerned and would like further evaluation.   Past Medical History:  Diagnosis Date   Alcohol use    Cocaine use    CVA (cerebral vascular accident) ([place])    Dizziness 11/2018   Family history of adverse reaction to anesthesia    " Donald York "   Peri-rectal abscess 06/09/2016   PUD (peptic ulcer disease)    Tobacco use    Vitamin D deficiency 01/2019   Vitamin D deficiency     Past Surgical History:  Procedure Laterality Date   NO PAST SURGERIES      Family History  Problem Relation Age of Onset   Lung cancer Father    Cancer Maternal Uncle     Social History   Socioeconomic History   Marital status: Single    Spouse name: Not on file   Number of children: Not on file   Years of education: Not on file   Highest education level: Not on file  Occupational History    Comment:  Architect  Tobacco Use   Smoking status: Some Days    Packs/day: 0.50    Pack years: 0.00    Types: Cigarettes   Smokeless tobacco: Never  Vaping Use   Vaping Use: Never used  Substance and Sexual Activity   Alcohol use: Yes    Comment: 40 oz beer, every now and then   Drug use: Yes    Types: Cocaine, Marijuana   Sexual activity: Not Currently  Other Topics Concern   Not on file  Social History Narrative   Not on file   Social Determinants of Health   Financial Resource Strain: Not on file  Food Insecurity: Not on file  Transportation Needs: Not on file  Physical Activity: Not on file  Stress: Not on file  Social Connections: Not on file  Intimate Partner Violence: Not on file    Outpatient Medications Prior to Visit  Medication Sig Dispense Refill   aspirin EC 81 MG tablet Take 1 tablet (81 mg total) by mouth daily. Swallow whole. 90 tablet 3   No facility-administered medications prior to visit.    No Known Allergies  ROS Review of Systems    Objective:    Physical Exam Constitutional:      Appearance: He is normal weight.  HENT:     Head: Normocephalic and atraumatic.  Cardiovascular:     Rate and Rhythm: Normal rate and regular rhythm.     Pulses: Normal pulses.     Heart sounds: Normal heart sounds.  Pulmonary:     Effort: Pulmonary effort is normal.     Breath sounds: Normal breath sounds.  Abdominal:     Palpations: Abdomen is soft.  Musculoskeletal:        General: Normal range of motion.     Cervical back: Normal range of motion.  Skin:    General: Skin is warm and dry.     Capillary Refill: Capillary refill takes less than 2 seconds.  Neurological:     General: No focal deficit present.     Mental Status: He is alert and oriented to person, place, and time.  Psychiatric:        Mood and Affect: Mood normal.    BP 104/62 (BP Location: Left Arm, Patient Position: Sitting)   Pulse 66   Temp 97.9 F (36.6 C)   Ht 6' (1.829 m)   Wt  169 lb (76.7 kg)   SpO2 100%   BMI 22.92 kg/m  Wt Readings from Last 3 Encounters:  08/13/20 169 lb (76.7 kg)  07/18/20 170 lb 3.2 oz (77.2 kg)  05/11/20 165 lb (74.8 kg)     Health Maintenance Due  Topic Date Due   Pneumococcal Vaccine 42-46 Years old (1 - PCV) Never done   Hepatitis C Screening  Never done   Zoster Vaccines- Shingrix (1 of 2) Never done    There are no preventive care reminders to display for this patient.  Lab Results  Component Value Date   TSH 2.200 05/11/2020   Lab Results  Component Value Date   WBC 7.4 05/11/2020   HGB 16.1 05/11/2020   HCT 46.6 05/11/2020   MCV 89 05/11/2020   PLT 300 05/11/2020   Lab Results  Component Value Date   NA 139 12/21/2018   K 3.6 12/21/2018   CO2 23 12/20/2018   GLUCOSE 111 (H) 12/21/2018   BUN 10 12/21/2018   CREATININE 1.40 (H) 12/21/2018   BILITOT 0.3 12/20/2018   ALKPHOS 43 12/20/2018   AST 22 12/20/2018   ALT 19 12/20/2018   PROT 5.1 (L) 12/20/2018   ALBUMIN 3.1 (L) 12/20/2018   CALCIUM 8.5 (L) 12/20/2018   ANIONGAP 10 12/20/2018   Lab Results  Component Value Date   CHOL 183 05/11/2020   Lab Results  Component Value Date   HDL 61 05/11/2020   Lab Results  Component Value Date   LDLCALC 96 05/11/2020   Lab Results  Component Value Date   TRIG 152 (H) 05/11/2020   Lab Results  Component Value Date   CHOLHDL 3.0 05/11/2020   Lab Results  Component Value Date   HGBA1C 5.8 (A) 08/13/2020   HGBA1C 5.8 08/13/2020   HGBA1C 5.8 08/13/2020   HGBA1C 5.8 08/13/2020      Assessment & Plan:   Problem List Items Addressed This Visit       Other   Stroke-like symptom - Primary Persistent  Requesting additional evaluation   Relevant Orders   Ambulatory referral to Neurology   Dizziness   Relevant Orders   CBC with Differential/Platelet   Comp. Metabolic Panel (12)   POCT glycosylated hemoglobin (Hb A1C) (Completed)   TSH   Glucose (CBG) (Completed)   Ambulatory referral to  Neurology   Shortness of breath Persistent  but stable    No orders of the defined types were placed in this encounter.   Follow-up: Return in about 2 months (around 10/13/2020).    Vevelyn Francois, NP

## 2020-08-13 NOTE — Patient Instructions (Signed)
Dizziness Dizziness is a common problem. It makes you feel unsteady or light-headed. You may feel like you are about to pass out (faint). Dizziness can lead to getting hurt if you stumble or fall. Dizziness can be caused by many things, including: Medicines. Not having enough water in your body (dehydration). Illness. Follow these instructions at home: Eating and drinking  Drink enough fluid to keep your pee (urine) pale yellow. This helps to keep you from getting dehydrated. Try to drink more clear fluids, such as water. Do not drink alcohol. Limit how much caffeine you drink or eat, if your doctor tells you to do that. Limit how much salt (sodium) you drink or eat, if your doctor tells you to do that.  Activity  Avoid making quick movements. Stand up slowly from sitting in a chair, and steady yourself until you feel okay. In the morning, first sit up on the side of the bed. When you feel okay, stand up slowly while you hold onto something. Do this until you know that your balance is okay. If you need to stand in one place for a long time, move your legs often. Tighten and relax the muscles in your legs while you are standing. Do not drive or use machinery if you feel dizzy. Avoid bending down if you feel dizzy. Place items in your home so you can reach them easily without leaning over.  Lifestyle Do not smoke or use any products that contain nicotine or tobacco. If you need help quitting, ask your doctor. Try to lower your stress level. You can do this by using methods such as yoga or meditation. Talk with your doctor if you need help. General instructions Watch your dizziness for any changes. Take over-the-counter and prescription medicines only as told by your doctor. Talk with your doctor if you think that you are dizzy because of a medicine that you are taking. Tell a friend or a family member that you are feeling dizzy. If he or she notices any changes in your behavior, have this  person call your doctor. Keep all follow-up visits. Contact a doctor if: Your dizziness does not go away. Your dizziness or light-headedness gets worse. You feel like you may vomit (are nauseous). You have trouble hearing. You have new symptoms. You are unsteady on your feet. You feel like the room is spinning. You have neck pain or a stiff neck. You have a fever. Get help right away if: You vomit or have watery poop (diarrhea), and you cannot eat or drink anything. You have trouble: Talking. Walking. Swallowing. Using your arms, hands, or legs. You feel generally weak. You are not thinking clearly, or you have trouble forming sentences. A friend or family member may notice this. You have: Chest pain. Pain in your belly (abdomen). Shortness of breath. Sweating. Your vision changes. You are bleeding. You have a very bad headache. These symptoms may be an emergency. Get help right away. Call your local emergency services (911 in the [place] Do not wait to see if the symptoms will go away. Do not drive yourself to the hospital. Summary Dizziness makes you feel unsteady or light-headed. You may feel like you are about to pass out (faint). Drink enough fluid to keep your pee (urine) pale yellow. Do not drink alcohol. Avoid making quick movements if you feel dizzy. Watch your dizziness for any changes. This information is not intended to replace advice given to you by your health care provider. Make sure you discuss   any questions you have with your healthcare provider. Document Revised: 01/16/2020 Document Reviewed: 01/16/2020 Elsevier Patient Education  2022 [place] Maintenance, Male Adopting a healthy lifestyle and getting preventive care are important in promoting health and wellness. Ask your health care provider about: The right schedule for you to have regular tests and exams. Things you can do on your own to prevent diseases and keep yourself healthy. What  should I know about diet, weight, and exercise? Eat a healthy diet  Eat a diet that includes plenty of vegetables, fruits, low-fat dairy products, and lean protein. Do not eat a lot of foods that are high in solid fats, added sugars, or sodium.  Maintain a healthy weight Body mass index (BMI) is a measurement that can be used to identify possible weight problems. It estimates body fat based on height and weight. Your health care provider can help determine your BMI and help you achieve or maintain ahealthy weight. Get regular exercise Get regular exercise. This is one of the most important things you can do for your health. Most adults should: Exercise for at least 150 minutes each week. The exercise should increase your heart rate and make you sweat (moderate-intensity exercise). Do strengthening exercises at least twice a week. This is in addition to the moderate-intensity exercise. Spend less time sitting. Even light physical activity can be beneficial. Watch cholesterol and blood lipids Have your blood tested for lipids and cholesterol at 60 years of age, then havethis test every 5 years. You may need to have your cholesterol levels checked more often if: Your lipid or cholesterol levels are high. You are older than 60 years of age. You are at high risk for heart disease. What should I know about cancer screening? Many types of cancers can be detected early and may often be prevented. Depending on your health history and family history, you may need to have cancer screening at various ages. This may include screening for: Colorectal cancer. Prostate cancer. Skin cancer. Lung cancer. What should I know about heart disease, diabetes, and high blood pressure? Blood pressure and heart disease High blood pressure causes heart disease and increases the risk of stroke. This is more likely to develop in people who have high blood pressure readings, are of African descent, or are  overweight. Talk with your health care provider about your target blood pressure readings. Have your blood pressure checked: Every 3-5 years if you are 41-63 years of age. Every year if you are 22 years old or older. If you are between the ages of 43 and 11 and are a current or former smoker, ask your health care provider if you should have a one-time screening for abdominal aortic aneurysm (AAA). Diabetes Have regular diabetes screenings. This checks your fasting blood sugar level. Have the screening done: Once every three years after age 44 if you are at a normal weight and have a low risk for diabetes. More often and at a younger age if you are overweight or have a high risk for diabetes. What should I know about preventing infection? Hepatitis B If you have a higher risk for hepatitis B, you should be screened for this virus. Talk with your health care provider to find out if you are at risk forhepatitis B infection. Hepatitis C Blood testing is recommended for: Everyone born from [birth year] through 1965. Anyone with known risk factors for hepatitis C. Sexually transmitted infections (STIs) You should be screened each year for STIs, including gonorrhea  and chlamydia, if: You are sexually active and are younger than 60 years of age. You are older than 60 years of age and your health care provider tells you that you are at risk for this type of infection. Your sexual activity has changed since you were last screened, and you are at increased risk for chlamydia or gonorrhea. Ask your health care provider if you are at risk. Ask your health care provider about whether you are at high risk for HIV. Your health care provider may recommend a prescription medicine to help prevent HIV infection. If you choose to take medicine to prevent HIV, you should first get tested for HIV. You should then be tested every 3 months for as long as you are taking the medicine. Follow these instructions at  home: Lifestyle Do not use any products that contain nicotine or tobacco, such as cigarettes, e-cigarettes, and chewing tobacco. If you need help quitting, ask your health care provider. Do not use street drugs. Do not share needles. Ask your health care provider for help if you need support or information about quitting drugs. Alcohol use Do not drink alcohol if your health care provider tells you not to drink. If you drink alcohol: Limit how much you have to 0-2 drinks a day. Be aware of how much alcohol is in your drink. In the [place] one drink equals one 12 oz bottle of beer (355 mL), one 5 oz glass of wine (148 mL), or one 1 oz glass of hard liquor (44 mL). General instructions Schedule regular health, dental, and eye exams. Stay current with your vaccines. Tell your health care provider if: You often feel depressed. You have ever been abused or do not feel safe at home. Summary Adopting a healthy lifestyle and getting preventive care are important in promoting health and wellness. Follow your health care provider's instructions about healthy diet, exercising, and getting tested or screened for diseases. Follow your health care provider's instructions on monitoring your cholesterol and blood pressure. This information is not intended to replace advice given to you by your health care provider. Make sure you discuss any questions you have with your healthcare provider. Document Revised: 02/03/2018 Document Reviewed: 02/03/2018 Elsevier Patient Education  2022 [employer].

## 2020-08-13 NOTE — Progress Notes (Signed)
[facility] General Follow Up Note  08/13/2020 Name: GUSTAVE LINDEMAN MRN: [id number] DOB: 04-22-1960 LASHAUN KRAPF is a 60 y.o. year old male who sees Vevelyn Francois, NP for primary care. LCSW was initially consulted to assist with community resources.  Interpreter: No.   Interpreter Name & Language: none  Assessment: Patient experiencing low income.  Ongoing Intervention: Today patient requested assistance in applying for disability. CSW referred patient to [facility] for assistance with disability claim. Patient consented to this referral. Provided CSW contact information and advised patient to call if help needed with follow up or with additional questions.  Review of patient status, including review of consultants reports, relevant laboratory and other test results, and collaboration with appropriate care team members and the patient's provider was performed as part of comprehensive patient evaluation and provision of services.    [name], [facility] [phone]

## 2020-08-15 ENCOUNTER — Encounter: Payer: Self-pay | Admitting: Nurse Practitioner

## 2020-08-16 ENCOUNTER — Telehealth: Payer: Self-pay | Admitting: Nurse Practitioner

## 2020-08-16 LAB — CBC WITH DIFFERENTIAL/PLATELET
Basophils Absolute: 0 10*3/uL (ref 0.0–0.2)
Basos: 0 %
EOS (ABSOLUTE): 0.2 10*3/uL (ref 0.0–0.4)
Eos: 2 %
Hematocrit: 41.2 % (ref 37.5–51.0)
Hemoglobin: 14.3 g/dL (ref 13.0–17.7)
Immature Grans (Abs): 0 10*3/uL (ref 0.0–0.1)
Immature Granulocytes: 0 %
Lymphocytes Absolute: 2.5 10*3/uL (ref 0.7–3.1)
Lymphs: 33 %
MCH: 29.9 pg (ref 26.6–33.0)
MCHC: 34.7 g/dL (ref 31.5–35.7)
MCV: 86 fL (ref 79–97)
Monocytes Absolute: 0.9 10*3/uL (ref 0.1–0.9)
Monocytes: 12 %
Neutrophils Absolute: 3.9 10*3/uL (ref 1.4–7.0)
Neutrophils: 53 %
Platelets: 279 10*3/uL (ref 150–450)
RBC: 4.78 x10E6/uL (ref 4.14–5.80)
RDW: 12.5 % (ref 11.6–15.4)
WBC: 7.5 10*3/uL (ref 3.4–10.8)

## 2020-08-16 LAB — COMP. METABOLIC PANEL (12)
AST: 14 IU/L (ref 0–40)
Albumin/Globulin Ratio: 1.8 (ref 1.2–2.2)
Albumin: 4.6 g/dL (ref 3.8–4.9)
Alkaline Phosphatase: 84 IU/L (ref 44–121)
BUN/Creatinine Ratio: 15 (ref 10–24)
BUN: 22 mg/dL (ref 8–27)
Bilirubin Total: 0.4 mg/dL (ref 0.0–1.2)
Calcium: 9.4 mg/dL (ref 8.6–10.2)
Chloride: 105 mmol/L (ref 96–106)
Creatinine, Ser: 1.46 mg/dL — ABNORMAL HIGH (ref 0.76–1.27)
Globulin, Total: 2.5 g/dL (ref 1.5–4.5)
Glucose: 89 mg/dL (ref 65–99)
Potassium: 4.8 mmol/L (ref 3.5–5.2)
Sodium: 142 mmol/L (ref 134–144)
Total Protein: 7.1 g/dL (ref 6.0–8.5)
eGFR: 55 mL/min/{1.73_m2} — ABNORMAL LOW (ref >59)

## 2020-08-16 LAB — TSH: TSH: 2.13 u[IU]/mL (ref 0.450–4.500)

## 2020-08-16 NOTE — Telephone Encounter (Signed)
We received a routine referral for this patient for stroke like symptoms with a past history of CVA. I attempted to reach out to patient and give advice for him to go straight to the ED if he is still experiencing these symptoms spoke with nurse Doroteo Bradford and she advised of the same as we can not rule out patient actively having a stroke. Patient's phone went straight to VM and I left a message for him.

## 2020-08-17 ENCOUNTER — Telehealth: Payer: Self-pay | Admitting: Nurse Practitioner

## 2020-08-17 ENCOUNTER — Encounter: Payer: Self-pay | Admitting: Nurse Practitioner

## 2020-08-17 DIAGNOSIS — N183 Chronic kidney disease, stage 3 unspecified: Secondary | ICD-10-CM | POA: Insufficient documentation

## 2020-08-17 NOTE — Telephone Encounter (Signed)
Patient notified of results, stated he is interested in referral to kidney specialist. Thanks.

## 2020-08-17 NOTE — Telephone Encounter (Signed)
-----   Message from Vevelyn Francois, NP sent at 08/17/2020  7:35 AM EDT ----- Overall labs are stable.  He has had on Chronic kidney disease it is mild but persistent  Has he been seen by kidney specialist and is there any interest?

## 2020-08-20 ENCOUNTER — Encounter: Payer: Self-pay | Admitting: Gastroenterology

## 2020-08-31 ENCOUNTER — Encounter: Payer: Self-pay | Admitting: Diagnostic Neuroimaging

## 2020-08-31 ENCOUNTER — Ambulatory Visit (INDEPENDENT_AMBULATORY_CARE_PROVIDER_SITE_OTHER): Payer: PRIVATE HEALTH INSURANCE | Admitting: Diagnostic Neuroimaging

## 2020-08-31 VITALS — BP 104/66 | HR 66 | Ht 73.0 in | Wt 167.8 lb

## 2020-08-31 DIAGNOSIS — R55 Syncope and collapse: Secondary | ICD-10-CM

## 2020-08-31 DIAGNOSIS — G450 Vertebro-basilar artery syndrome: Secondary | ICD-10-CM | POA: Diagnosis not present

## 2020-08-31 DIAGNOSIS — R42 Dizziness and giddiness: Secondary | ICD-10-CM

## 2020-08-31 DIAGNOSIS — R4689 Other symptoms and signs involving appearance and behavior: Secondary | ICD-10-CM

## 2020-08-31 NOTE — Progress Notes (Signed)
[facility]  PATIENT: Donald York DOB: 03-Oct-1960  REFERRING CLINICIAN: Vevelyn Francois, NP HISTORY FROM: patient  REASON FOR VISIT: new consult    HISTORICAL  CHIEF COMPLAINT:  Chief Complaint  Patient presents with   Rm 7, alone.NP/internal referral for stroke like symptoms    C/O  blurry episodes every day 2-3 x daily. Duration 2-3 minutes.       HISTORY OF PRESENT ILLNESS:   60 year old male here for evaluation of abnormal spells.  History of alcohol and cocaine abuse in the past.  Patient reports multiple episodes on almost daily basis since 2020 of whole-body freezing up, losing vision, unable to move for 2 to 3 minutes at a time.  No specific triggering or aggravating factors.   REVIEW OF SYSTEMS: Full 14 system review of systems performed and negative with exception of: as per HPI.  ALLERGIES: No Known Allergies  HOME MEDICATIONS: Outpatient Medications Prior to Visit  Medication Sig Dispense Refill   aspirin EC 81 MG tablet Take 1 tablet (81 mg total) by mouth daily. Swallow whole. 90 tablet 3   No facility-administered medications prior to visit.    PAST MEDICAL HISTORY: Past Medical History:  Diagnosis Date   Alcohol use    Cocaine use    CVA (cerebral vascular accident) ([place])    Dizziness 11/2018   Family history of adverse reaction to anesthesia    " [name] "   Peri-rectal abscess 06/09/2016   PUD (peptic ulcer disease)    Tobacco use    Vitamin D deficiency 01/2019   Vitamin D deficiency     PAST SURGICAL HISTORY: Past Surgical History:  Procedure Laterality Date   NO PAST SURGERIES      FAMILY HISTORY: Family History  Problem Relation Age of Onset   Lung cancer Father    Cancer Maternal Uncle     SOCIAL HISTORY: Social History   Socioeconomic History   Marital status: Single    Spouse name: Not on file   Number of children: Not on file   Years of education: Not on file   Highest education level: Not on  file  Occupational History    Comment: Architect  Tobacco Use   Smoking status: Some Days    Packs/day: 0.50    Pack years: 0.00    Types: Cigarettes   Smokeless tobacco: Never  Vaping Use   Vaping Use: Never used  Substance and Sexual Activity   Alcohol use: Yes    Comment: 40 oz beer, every now and then   Drug use: Yes    Types: Cocaine, Marijuana   Sexual activity: Not Currently  Other Topics Concern   Not on file  Social History Narrative   Caffeine 2-3 cups daily.  (Sometimes 1-2 atpm).  Education : 12 th grade.  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    Social Determinants of Health   Financial Resource Strain: Not on file  Food Insecurity: Not on file  Transportation Needs: Not on file  Physical Activity: Not on file  Stress: Not on file  Social Connections: Not on file  Intimate Partner Violence: Not on file     PHYSICAL EXAM  GENERAL EXAM/CONSTITUTIONAL: Vitals:  Vitals:   08/31/20 1059  BP: 104/66  Pulse: 66  Weight: 167 lb 12.8 oz (76.1 kg)  Height: 6\' 1"  (1.854 m)   Body mass index is 22.14 kg/m. Wt Readings from Last 3 Encounters:  08/31/20  167 lb 12.8 oz (76.1 kg)  08/13/20 169 lb (76.7 kg)  07/18/20 170 lb 3.2 oz (77.2 kg)   Patient is in no distress; well developed, nourished and groomed; neck is supple  CARDIOVASCULAR: Examination of carotid arteries is normal; no carotid bruits Regular rate and rhythm, no murmurs Examination of peripheral vascular system by observation and palpation is normal  EYES: Ophthalmoscopic exam of optic discs and posterior segments is normal; no papilledema or hemorrhages Vision Screening   Right eye Left eye Both eyes  Without correction 20/30 2040   With correction       MUSCULOSKELETAL: Gait, strength, tone, movements noted in Neurologic exam below  NEUROLOGIC: MENTAL STATUS:  No flowsheet data found. awake, alert, oriented to person, place and time recent and  remote memory intact normal attention and concentration language fluent, comprehension intact, naming intact fund of knowledge appropriate  CRANIAL NERVE:  2nd - no papilledema on fundoscopic exam 2nd, 3rd, 4th, 6th - pupils equal and reactive to light, visual fields full to confrontation, extraocular muscles intact, no nystagmus 5th - facial sensation symmetric 7th - facial strength symmetric 8th - hearing intact 9th - palate elevates symmetrically, uvula midline 11th - shoulder shrug symmetric 12th - tongue protrusion midline  MOTOR:  normal bulk and tone, full strength in the BUE, BLE  SENSORY:  normal and symmetric to light touch, temperature, vibration  COORDINATION:  finger-nose-finger, fine finger movements normal  REFLEXES:  deep tendon reflexes TRACE  and symmetric  GAIT/STATION:  narrow based gait     DIAGNOSTIC DATA (LABS, IMAGING, TESTING) - I reviewed patient records, labs, notes, testing and imaging myself where available.  Lab Results  Component Value Date   WBC 7.5 08/13/2020   HGB 14.3 08/13/2020   HCT 41.2 08/13/2020   MCV 86 08/13/2020   PLT 279 08/13/2020      Component Value Date/Time   NA 142 08/13/2020 1540   K 4.8 08/13/2020 1540   CL 105 08/13/2020 1540   CO2 23 12/20/2018 2353   GLUCOSE 89 08/13/2020 1540   GLUCOSE 111 (H) 12/21/2018 0000   BUN 22 08/13/2020 1540   CREATININE 1.46 (H) 08/13/2020 1540   CALCIUM 9.4 08/13/2020 1540   PROT 7.1 08/13/2020 1540   ALBUMIN 4.6 08/13/2020 1540   AST 14 08/13/2020 1540   ALT 19 12/20/2018 2353   ALKPHOS 84 08/13/2020 1540   BILITOT 0.4 08/13/2020 1540   GFRNONAA 55 (L) 12/20/2018 2353   GFRAA >60 12/20/2018 2353   Lab Results  Component Value Date   CHOL 183 05/11/2020   HDL 61 05/11/2020   LDLCALC 96 05/11/2020   TRIG 152 (H) 05/11/2020   CHOLHDL 3.0 05/11/2020   Lab Results  Component Value Date   HGBA1C 5.8 (A) 08/13/2020   HGBA1C 5.8 08/13/2020   HGBA1C 5.8 08/13/2020    HGBA1C 5.8 08/13/2020   Lab Results  Component Value Date   EQASTMHD62 229 05/11/2020   Lab Results  Component Value Date   TSH 2.130 08/13/2020    12/21/18 CTA head / neck - No emergent large vessel occlusion or high-grade stenosis.  12/21/18 MRI brain 1. No acute intracranial abnormality. 2. 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  04/04/19 MRI brain 1. Solitary small acute lacunar infarct suspected in the dorsal left pons. However, if symptomatic right side symptoms would be expected. No associated hemorrhage or mass effect. 2. Otherwise stable since October underlying moderate for age signal changes most compatible  with chronic small vessel disease.  11/04/19 TTE  1. Left ventricular ejection fraction, by estimation, is 60 to 65%. The  left ventricle has normal function. The left ventricle has no regional  wall motion abnormalities. Left ventricular diastolic parameters were  normal.   2. Right ventricular systolic function is normal. The right ventricular  size is normal. There is normal pulmonary artery systolic pressure.   3. The mitral valve is normal in structure. Trivial mitral valve  regurgitation. No evidence of mitral stenosis.   4. Although not so clearly demonstrated on the current study, the aortic  valve clearly has 3 cusps on previous studies. There is thickening,  restricted motion and doming of the leaflets (particularly obvious for the  right cusp). Consider rheumatic or  other post-inflammaroty etiology. Aortic insufficiency vena contracta 2.5  mm. The aortic valve is tricuspid. There is mild calcification of the  aortic valve. There is moderate thickening of the aortic valve. Aortic  valve regurgitation is moderate. Mild  aortic valve stenosis. Aortic valve area, by VTI measures 1.53 cm. Aortic  valve mean gradient measures 15.0 mmHg. Aortic valve Vmax measures 2.63  m/s.   5. The inferior vena cava is normal  in size with greater than 50%  respiratory variability, suggesting right atrial pressure of 3 mmHg.     ASSESSMENT AND PLAN  60 y.o. year old male here with multiple episodes of lightheadedness, dizziness, losing vision, body freezing up, suspicious for presyncope attacks.  We will proceed with further work-up to rule out vertebrobasilar insufficiency, but due to frequent nature, almost daily basis since 2020, unlikely to represent TIA.  Dx:  1. Spell of abnormal behavior   2. Vertebrobasilar artery insufficiency   3. Postural dizziness with presyncope      PLAN:  INTERMITTENT SPELLS (whole body "freezes up"; daily events; few minutes at a time; loses vision; could be pre-syncope vs vertebro-basilar insufficiency) - check MRI brain, MRA head / neck - continue aspirin 81mg  daily - follow up with PCP and cardiology for hypotension / pre-syncope evaluation  Orders Placed This Encounter  Procedures   MR BRAIN WO CONTRAST   MR ANGIO HEAD WO CONTRAST   MR Secor   Return for pending test results, pending if symptoms worsen or fail to improve.    Penni Bombard, MD 03/28/9796, 92:11 AM Certified in Neurology, Neurophysiology and Neuroimaging  [facility] [address] [place], [place] [address] [phone]

## 2020-09-05 ENCOUNTER — Telehealth: Payer: Self-pay | Admitting: *Deleted

## 2020-09-05 ENCOUNTER — Other Ambulatory Visit: Payer: Self-pay

## 2020-09-05 ENCOUNTER — Ambulatory Visit (AMBULATORY_SURGERY_CENTER): Payer: PRIVATE HEALTH INSURANCE | Admitting: *Deleted

## 2020-09-05 VITALS — Ht 72.0 in | Wt 168.0 lb

## 2020-09-05 DIAGNOSIS — Z1211 Encounter for screening for malignant neoplasm of colon: Secondary | ICD-10-CM

## 2020-09-05 NOTE — Telephone Encounter (Signed)
Noted. thx 

## 2020-09-05 NOTE — Progress Notes (Signed)
No egg or soy allergy known to patient  No issues with past sedation with any surgeries or procedures Patient denies ever being told they had issues or difficulty with intubation  No FH of Malignant Hyperthermia No diet pills per patient No home 02 use per patient  No blood thinners per patient  Pt denies issues with constipation  No A fib or A flutter  EMMI video to pt or via [place] 19 guidelines implemented in [place] today with Pt and RN  Pt is fully vaccinated  for Covid   Due to the COVID-19 pandemic we are asking patients to follow certain guidelines.  Pt aware of COVID protocols and [facility] guidelines   Te John Nulty to review chart

## 2020-09-05 NOTE — Telephone Encounter (Signed)
Donald York,  Can you review this pt's chart for his upcoming colon? I did his PV this afternoon, he is a screening for [place] 7-27. He has Aortic Valve Mild Stenosis, his mean is 15, Peak is 27.6,  AVA 1.53 which is all normal, but his cardiologist stated in his note that he may need AV replacement and he has an Echo for 10-2020. He has a hx of stroke-  he saw Neuro Friday 7-8 and has been having lightheadedness, dizziness and Neuro has ordered MRI of head and neck 7-21 and 7-23.    I just want to make sure he is cleared for Watson colon 7-27  Thanks   Lelan Pons

## 2020-09-09 ENCOUNTER — Encounter (HOSPITAL_COMMUNITY): Payer: Self-pay | Admitting: Oncology

## 2020-09-09 ENCOUNTER — Emergency Department (HOSPITAL_COMMUNITY)
Admission: EM | Admit: 2020-09-09 | Discharge: 2020-09-09 | Disposition: A | Discharge status: Home / Self Care | Payer: PRIVATE HEALTH INSURANCE | Attending: Emergency Medicine | Admitting: Emergency Medicine

## 2020-09-09 ENCOUNTER — Other Ambulatory Visit: Payer: Self-pay

## 2020-09-09 DIAGNOSIS — Z202 Contact with and (suspected) exposure to infections with a predominantly sexual mode of transmission: Secondary | ICD-10-CM | POA: Diagnosis not present

## 2020-09-09 DIAGNOSIS — Z7982 Long term (current) use of aspirin: Secondary | ICD-10-CM | POA: Diagnosis not present

## 2020-09-09 DIAGNOSIS — F17210 Nicotine dependence, cigarettes, uncomplicated: Secondary | ICD-10-CM | POA: Insufficient documentation

## 2020-09-09 DIAGNOSIS — N183 Chronic kidney disease, stage 3 unspecified: Secondary | ICD-10-CM | POA: Diagnosis not present

## 2020-09-09 DIAGNOSIS — R300 Dysuria: Secondary | ICD-10-CM | POA: Insufficient documentation

## 2020-09-09 DIAGNOSIS — R369 Urethral discharge, unspecified: Secondary | ICD-10-CM | POA: Insufficient documentation

## 2020-09-09 MED ORDER — DOXYCYCLINE HYCLATE 100 MG PO CAPS: 100.0000 mg | ORAL_CAPSULE | Freq: Two times a day (BID) | ORAL | 0 refills | Status: DC

## 2020-09-09 MED ORDER — CEFTRIAXONE SODIUM 1 G IJ SOLR
500.0000 mg | Freq: Once | INTRAMUSCULAR | Status: AC
  Administered 2020-09-09: 500 mg via INTRAMUSCULAR
  Filled 2020-09-09: qty 10

## 2020-09-09 NOTE — ED Triage Notes (Signed)
Pt reports yellow discharge from penis.  Would like to be tx for STI.

## 2020-09-09 NOTE — ED Provider Notes (Signed)
[name]   CSN: [id number] Arrival date & time: 09/09/20  9798     History Chief Complaint  Patient presents with   Exposure to STD    Donald York is a 60 y.o. male.  60 year old male presents with penile discharge times several days.  Patient has history of STD in the past and this feels similar.  Has had yellow discharge and dysuria.  Denies any fever or flank pain.  Denies any rashes to his genitals.      Past Medical History:  Diagnosis Date   Alcohol use    Allergy    seasonal - pollen   Aortic valve disorder    echo 9-21 shows mild AVS- Mean 15, Peak 27.6, AVA 1.53   Arthritis    Cocaine use    CVA (cerebral vascular accident) ([place]) 2020   Dizziness 11/2018   Family history of adverse reaction to anesthesia    " [place] "   GERD (gastroesophageal reflux disease)    TUMS PRN but rare use   Peri-rectal abscess 06/09/2016   PUD (peptic ulcer disease)    3 times prior to CVA due to ASA use   Seizures ([place])    in 20's=- none since   Tobacco use    Vitamin D deficiency 01/2019   Vitamin D deficiency     Patient Active Problem List   Diagnosis Date Noted   CKD (chronic kidney disease) stage 3, GFR 30-59 ml/min ([place]) 08/17/2020   Chronic bilateral low back pain with bilateral sciatica 06/29/2019   Right leg pain 05/01/2019   Temporal arteritis ([place]) 04/05/2019   History of stroke 04/05/2019   Chronic left-sided headaches 04/05/2019   Shortness of breath 04/05/2019   Stroke-like symptom 02/01/2019   Numbness and tingling of both legs 02/01/2019   Dizziness 02/01/2019   Alcohol use 02/01/2019   Duodenal ulcer 05/25/2018   AKI (acute kidney injury) ([place]) 06/09/2016   Tobacco abuse 06/09/2016   Cocaine use 06/09/2016    Past Surgical History:  Procedure Laterality Date   NO PAST SURGERIES         Family History  Problem Relation Age of Onset   Lung cancer Father    Colon cancer Maternal Uncle     Cancer Maternal Uncle    Colon cancer Maternal Uncle    Colon polyps Neg Hx    Esophageal cancer Neg Hx    Rectal cancer Neg Hx    Stomach cancer Neg Hx     Social History   Tobacco Use   Smoking status: Some Days    Packs/day: 0.50    Types: Cigarettes   Smokeless tobacco: Never   Tobacco comments:    Smokes half a pack or less daily   Vaping Use   Vaping Use: Never used  Substance Use Topics   Alcohol use: Yes    Comment: 40 oz beer, every now and then   Drug use: Yes    Types: Cocaine, Marijuana    Home Medications Prior to Admission medications   Medication Sig Start Date End Date Taking? Authorizing Provider  aspirin EC 81 MG tablet Take 1 tablet (81 mg total) by mouth daily. Swallow whole. 07/18/20   Lelon Perla, MD    Allergies    Patient has no known allergies.  Review of Systems   Review of Systems  All other systems reviewed and are negative.  Physical Exam Updated Vital Signs BP 110/73 (BP  Location: Left Arm)   Pulse 70   Temp 97.7 F (36.5 C) (Oral)   Resp 18   SpO2 100%   Physical Exam Vitals and nursing note reviewed.  Constitutional:      General: He is not in acute distress.    Appearance: Normal appearance. He is well-developed. He is not toxic-appearing.  HENT:     Head: Normocephalic and atraumatic.  Eyes:     General: Lids are normal.     Conjunctiva/sclera: Conjunctivae normal.     Pupils: Pupils are equal, round, and reactive to light.  Neck:     Thyroid: No thyroid mass.     Trachea: No tracheal deviation.  Cardiovascular:     Rate and Rhythm: Normal rate and regular rhythm.     Heart sounds: Normal heart sounds. No murmur heard.   No gallop.  Pulmonary:     Effort: Pulmonary effort is normal. No respiratory distress.     Breath sounds: Normal breath sounds. No stridor. No decreased breath sounds, wheezing, rhonchi or rales.  Abdominal:     General: There is no distension.     Palpations: Abdomen is soft.      Tenderness: There is no abdominal tenderness. There is no rebound.  Genitourinary:    Penis: Uncircumcised. Discharge present.   Musculoskeletal:        General: No tenderness. Normal range of motion.     Cervical back: Normal range of motion and neck supple.  Skin:    General: Skin is warm and dry.     Findings: No abrasion or rash.  Neurological:     Mental Status: He is alert and oriented to person, place, and time. Mental status is at baseline.     GCS: GCS eye subscore is 4. GCS verbal subscore is 5. GCS motor subscore is 6.     Cranial Nerves: Cranial nerves are intact. No cranial nerve deficit.     Sensory: No sensory deficit.     Motor: Motor function is intact.  Psychiatric:        Attention and Perception: Attention normal.        Speech: Speech normal.        Behavior: Behavior normal.    ED Results / Procedures / Treatments   Labs (all labs ordered are listed, but only abnormal results are displayed) Labs Reviewed - No data to display  EKG None  Radiology No results found.  Procedures Procedures   Medications Ordered in ED Medications - No data to display  ED Course  I have reviewed the triage vital signs and the nursing notes.  Pertinent labs & imaging results that were available during my care of the patient were reviewed by me and considered in my medical decision making (see chart for details).    MDM Rules/Calculators/A&P                          swab here for GC.  Patient was treated with Rocephin and will be placed on doxycycline. Final Clinical Impression(s) / ED Diagnoses Final diagnoses:  None    Rx / DC Orders ED Discharge Orders     None        Lacretia Leigh, MD 09/09/20 [phone]

## 2020-09-10 LAB — GC/CHLAMYDIA PROBE AMP (~~LOC~~) NOT AT ARMC
Chlamydia: NEGATIVE
Comment: NEGATIVE
Comment: NORMAL
Neisseria Gonorrhea: POSITIVE — AB

## 2020-09-13 ENCOUNTER — Other Ambulatory Visit: Payer: Self-pay

## 2020-09-15 ENCOUNTER — Other Ambulatory Visit: Payer: Self-pay

## 2020-09-19 ENCOUNTER — Encounter: Payer: Self-pay | Admitting: Gastroenterology

## 2020-09-25 ENCOUNTER — Other Ambulatory Visit: Payer: Self-pay

## 2020-09-25 ENCOUNTER — Ambulatory Visit
Admission: RE | Admit: 2020-09-25 | Discharge: 2020-09-25 | Disposition: A | Discharge status: Home / Self Care | Payer: PRIVATE HEALTH INSURANCE | Source: Ambulatory Visit | Attending: Diagnostic Neuroimaging | Admitting: Diagnostic Neuroimaging

## 2020-09-25 DIAGNOSIS — R42 Dizziness and giddiness: Secondary | ICD-10-CM

## 2020-09-25 DIAGNOSIS — R55 Syncope and collapse: Secondary | ICD-10-CM

## 2020-09-25 DIAGNOSIS — G450 Vertebro-basilar artery syndrome: Secondary | ICD-10-CM

## 2020-09-25 DIAGNOSIS — R4689 Other symptoms and signs involving appearance and behavior: Secondary | ICD-10-CM

## 2020-09-28 ENCOUNTER — Other Ambulatory Visit: Payer: PRIVATE HEALTH INSURANCE

## 2020-10-03 ENCOUNTER — Telehealth: Payer: Self-pay

## 2020-10-03 NOTE — Telephone Encounter (Signed)
Contacted pt, informed him that his study showed chronic small vessel ischemic disease, and advised to continue current plan. He stated dr. Leta Baptist never gave him any medication. Advised to just continue the plan last discussed and to call the office with questions as he had no more at his time.

## 2020-10-03 NOTE — Telephone Encounter (Signed)
-----   Message from Penni Bombard, MD sent at 10/03/2020  3:15 PM EDT ----- Chronic chronic small vessel ischemic disease. Continue current plan. -VRP

## 2020-10-15 ENCOUNTER — Encounter: Payer: Self-pay | Admitting: Gastroenterology

## 2020-10-22 ENCOUNTER — Ambulatory Visit: Payer: Self-pay | Admitting: Nurse Practitioner

## 2020-11-02 ENCOUNTER — Encounter: Payer: Self-pay | Admitting: Nurse Practitioner

## 2020-11-02 ENCOUNTER — Other Ambulatory Visit: Payer: Self-pay

## 2020-11-02 ENCOUNTER — Ambulatory Visit (INDEPENDENT_AMBULATORY_CARE_PROVIDER_SITE_OTHER): Payer: PRIVATE HEALTH INSURANCE | Admitting: Nurse Practitioner

## 2020-11-02 VITALS — BP 102/57 | HR 61 | Temp 97.1°F | Ht 72.0 in | Wt 164.0 lb

## 2020-11-02 DIAGNOSIS — Z0000 Encounter for general adult medical examination without abnormal findings: Secondary | ICD-10-CM

## 2020-11-02 DIAGNOSIS — L918 Other hypertrophic disorders of the skin: Secondary | ICD-10-CM | POA: Diagnosis not present

## 2020-11-02 DIAGNOSIS — R7989 Other specified abnormal findings of blood chemistry: Secondary | ICD-10-CM

## 2020-11-02 DIAGNOSIS — D229 Melanocytic nevi, unspecified: Secondary | ICD-10-CM | POA: Diagnosis not present

## 2020-11-02 DIAGNOSIS — I9589 Other hypotension: Secondary | ICD-10-CM

## 2020-11-02 DIAGNOSIS — R42 Dizziness and giddiness: Secondary | ICD-10-CM

## 2020-11-02 NOTE — Patient Instructions (Signed)
You were seen today in the [facility] for skin abnormality and dizziness. Labs were collected, results will be available via MyChart or, if abnormal, you will be contacted by clinic staff.  Please follow up in 3 mths for reevaluation. Referral was sent to nephrology and dermatology, they will call you with an appointment.

## 2020-11-02 NOTE — Progress Notes (Signed)
[place], [place]  [address] Phone:  [phone]   Fax:  [phone] Subjective:   Patient ID: Donald York, male    DOB: Feb 01, 1961, 60 y.o.   MRN: [id number]  Chief Complaint  Patient presents with   Follow-up    2 month follow up; dark spot on left and right leg getting bigger.  Stated he got dizzy this morning and felt like he was going to pass out.    HPI Donald York 60 y.o. male presents with  Syncope: Patient complains of syncope. Onset was 1 year ago, with unchanged course since that time. Patient describes the episode as syncopal episode occurred while standing . States that typically while sitting and/ standing. Patient also has associated symptoms of  general feeling of lightheadedness. The patient denies excessive thirst, headache, history of CAD, nausea, tachycardia/palpitations, and visual aura. Taking culprit meds?: none Last occurrence of syncope this morning while standing, lasted 3-4 minutes. Resolved without intervention. Feels like room is spinning. Similar to previous episodes of syncope. Patient states that he was informed dizziness was likely related to intermittent decreases in B/P. Denies any chest pain, HA, fever or shortness of breath.   Patient also concerned about mole to left thigh and skin tag to right thigh. Patient states that mole on left thigh has been present for 2-3 yrs, recently became hard and larger. Skin tag on right thigh has bene present since 2013, increased in size recently. Patient concerned for possible skin cancer.  Past Medical History:  Diagnosis Date   Alcohol use    Allergy    seasonal - pollen   Aortic valve disorder    echo 9-21 shows mild AVS- Mean 15, Peak 27.6, AVA 1.53   Arthritis    Cocaine use    CVA (cerebral vascular accident) ([place]) 2020   Dizziness 11/2018   Family history of adverse reaction to anesthesia    " [place] "   GERD (gastroesophageal reflux disease)     TUMS PRN but rare use   Peri-rectal abscess 06/09/2016   PUD (peptic ulcer disease)    3 times prior to CVA due to ASA use   Seizures ([place])    in 20's=- none since   Tobacco use    Vitamin D deficiency 01/2019   Vitamin D deficiency     Past Surgical History:  Procedure Laterality Date   NO PAST SURGERIES      Family History  Problem Relation Age of Onset   Lung cancer Father    Colon cancer Maternal Uncle    Cancer Maternal Uncle    Colon cancer Maternal Uncle    Colon polyps Neg Hx    Esophageal cancer Neg Hx    Rectal cancer Neg Hx    Stomach cancer Neg Hx     Social History   Socioeconomic History   Marital status: Single    Spouse name: Not on file   Number of children: Not on file   Years of education: Not on file   Highest education level: Not on file  Occupational History    Comment: Architect  Tobacco Use   Smoking status: Some Days    Packs/day: 0.50    Types: Cigarettes   Smokeless tobacco: Never   Tobacco comments:    Smokes half a pack or less daily   Vaping Use   Vaping Use: Never used  Substance and Sexual Activity   Alcohol  use: Yes    Comment: 40 oz beer, every now and then   Drug use: Yes    Types: Cocaine, Marijuana   Sexual activity: Yes  Other Topics Concern   Not on file  Social History Narrative   Caffeine 2-3 cups daily.  (Sometimes 1-2 atpm).  Education : 12 th grade.  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    Social Determinants of Health   Financial Resource Strain: Not on file  Food Insecurity: Not on file  Transportation Needs: Not on file  Physical Activity: Not on file  Stress: Not on file  Social Connections: Not on file  Intimate Partner Violence: Not on file    Outpatient Medications Prior to Visit  Medication Sig Dispense Refill   aspirin EC 81 MG tablet Take 1 tablet (81 mg total) by mouth daily. Swallow whole. 90 tablet 3   doxycycline (VIBRAMYCIN) 100 MG capsule Take 1  capsule (100 mg total) by mouth 2 (two) times daily. 20 capsule 0   No facility-administered medications prior to visit.    No Known Allergies  Review of Systems  Constitutional:  Negative for chills, fever and malaise/fatigue.  Respiratory:  Negative for cough and shortness of breath.   Cardiovascular:  Negative for chest pain, palpitations and leg swelling.  Gastrointestinal:  Negative for abdominal pain, blood in stool, constipation, diarrhea, nausea and vomiting.  Skin:        See HPI  Neurological:  Positive for dizziness. Negative for sensory change, speech change, focal weakness, seizures, loss of consciousness and weakness.  Psychiatric/Behavioral:  Negative for depression. The patient is not nervous/anxious.   All other systems reviewed and are negative.     Objective:    Physical Exam Vitals reviewed.  Constitutional:      General: He is not in acute distress.    Appearance: Normal appearance.  HENT:     Head: Normocephalic.  Eyes:     Extraocular Movements: Extraocular movements intact.     Conjunctiva/sclera: Conjunctivae normal.     Pupils: Pupils are equal, round, and reactive to light.  Cardiovascular:     Rate and Rhythm: Normal rate and regular rhythm.     Pulses: Normal pulses.     Heart sounds: Normal heart sounds.     Comments: No obvious peripheral edema Pulmonary:     Effort: Pulmonary effort is normal.     Breath sounds: Normal breath sounds.  Musculoskeletal:        General: Normal range of motion.  Skin:    General: Skin is warm and dry.     Capillary Refill: Capillary refill takes less than 2 seconds.     Comments: Skin tag noted to right thigh, non tender to palpation, no discoloration. Nevus noted to left thigh, fixed, dark brown, non tender to palpation.   Neurological:     General: No focal deficit present.     Mental Status: He is alert and oriented to person, place, and time.  Psychiatric:        Mood and Affect: Mood normal.         Behavior: Behavior normal.        Thought Content: Thought content normal.        Judgment: Judgment normal.    BP (!) 102/57 (BP Location: Left Arm, Patient Position: Sitting)   Pulse 61   Temp (!) 97.1 F (36.2 C)   Ht 6' (1.829 m)   Wt 164 lb (  74.4 kg)   SpO2 100%   BMI 22.24 kg/m  Wt Readings from Last 3 Encounters:  11/02/20 164 lb (74.4 kg)  09/05/20 168 lb (76.2 kg)  08/31/20 167 lb 12.8 oz (76.1 kg)    Immunization History  Administered Date(s) Administered   Moderna SARS-COV2 Booster Vaccination 07/13/2020   Moderna Sars-Covid-2 Vaccination 07/20/2019, 08/20/2019   Tdap 09/04/2017    Diabetic Foot Exam - Simple   No data filed     Lab Results  Component Value Date   TSH 2.130 08/13/2020   Lab Results  Component Value Date   WBC 7.5 08/13/2020   HGB 14.3 08/13/2020   HCT 41.2 08/13/2020   MCV 86 08/13/2020   PLT 279 08/13/2020   Lab Results  Component Value Date   NA 142 08/13/2020   K 4.8 08/13/2020   CO2 23 12/20/2018   GLUCOSE 89 08/13/2020   BUN 22 08/13/2020   CREATININE 1.46 (H) 08/13/2020   BILITOT 0.4 08/13/2020   ALKPHOS 84 08/13/2020   AST 14 08/13/2020   ALT 19 12/20/2018   PROT 7.1 08/13/2020   ALBUMIN 4.6 08/13/2020   CALCIUM 9.4 08/13/2020   ANIONGAP 10 12/20/2018   EGFR 55 (L) 08/13/2020   Lab Results  Component Value Date   CHOL 183 05/11/2020   Lab Results  Component Value Date   HDL 61 05/11/2020   Lab Results  Component Value Date   LDLCALC 96 05/11/2020   Lab Results  Component Value Date   TRIG 152 (H) 05/11/2020   Lab Results  Component Value Date   CHOLHDL 3.0 05/11/2020   Lab Results  Component Value Date   HGBA1C 5.8 (A) 08/13/2020   HGBA1C 5.8 08/13/2020   HGBA1C 5.8 08/13/2020   HGBA1C 5.8 08/13/2020       Assessment & Plan:   Problem List Items Addressed This Visit   None Visit Diagnoses     Healthcare maintenance    -  Primary   Relevant Orders   CBC with Differential/Platelet    Comprehensive metabolic panel   Nevus       Relevant Orders   Ambulatory referral to Dermatology Informed that likely non cancerous, but biopsy can be completely by dermatologist   Elevated serum creatinine       Relevant Orders   Ambulatory referral to Nephrology   Skin tag       Relevant Orders   Ambulatory referral to Dermatology   Light headedness       Other specified hypotension            Informed to maintain upcoming follow up with cardiology  Follow up in 3 mths for reevaluation of symptoms.     I have discontinued [place] doxycycline. I am also having him maintain his aspirin EC.  No orders of the defined types were placed in this encounter.    Teena Dunk, NP

## 2020-11-03 LAB — CBC WITH DIFFERENTIAL/PLATELET
Basophils Absolute: 0 10*3/uL (ref 0.0–0.2)
Basos: 0 %
EOS (ABSOLUTE): 0.2 10*3/uL (ref 0.0–0.4)
Eos: 3 %
Hematocrit: 42.5 % (ref 37.5–51.0)
Hemoglobin: 14.2 g/dL (ref 13.0–17.7)
Immature Grans (Abs): 0 10*3/uL (ref 0.0–0.1)
Immature Granulocytes: 0 %
Lymphocytes Absolute: 2.2 10*3/uL (ref 0.7–3.1)
Lymphs: 36 %
MCH: 29.3 pg (ref 26.6–33.0)
MCHC: 33.4 g/dL (ref 31.5–35.7)
MCV: 88 fL (ref 79–97)
Monocytes Absolute: 0.6 10*3/uL (ref 0.1–0.9)
Monocytes: 9 %
Neutrophils Absolute: 3.3 10*3/uL (ref 1.4–7.0)
Neutrophils: 52 %
Platelets: 275 10*3/uL (ref 150–450)
RBC: 4.84 x10E6/uL (ref 4.14–5.80)
RDW: 13.1 % (ref 11.6–15.4)
WBC: 6.3 10*3/uL (ref 3.4–10.8)

## 2020-11-03 LAB — COMPREHENSIVE METABOLIC PANEL
ALT: 8 IU/L (ref 0–44)
AST: 12 IU/L (ref 0–40)
Albumin/Globulin Ratio: 1.8 (ref 1.2–2.2)
Albumin: 4.4 g/dL (ref 3.8–4.9)
Alkaline Phosphatase: 74 IU/L (ref 44–121)
BUN/Creatinine Ratio: 17 (ref 10–24)
BUN: 22 mg/dL (ref 8–27)
Bilirubin Total: <0.2 mg/dL (ref 0.0–1.2)
CO2: 25 mmol/L (ref 20–29)
Calcium: 9 mg/dL (ref 8.6–10.2)
Chloride: 103 mmol/L (ref 96–106)
Creatinine, Ser: 1.29 mg/dL — ABNORMAL HIGH (ref 0.76–1.27)
Globulin, Total: 2.4 g/dL (ref 1.5–4.5)
Glucose: 87 mg/dL (ref 65–99)
Potassium: 4.5 mmol/L (ref 3.5–5.2)
Sodium: 140 mmol/L (ref 134–144)
Total Protein: 6.8 g/dL (ref 6.0–8.5)
eGFR: 63 mL/min/{1.73_m2} (ref >59)

## 2020-11-16 ENCOUNTER — Ambulatory Visit (HOSPITAL_COMMUNITY): Payer: PRIVATE HEALTH INSURANCE | Attending: Cardiology

## 2020-11-16 ENCOUNTER — Other Ambulatory Visit: Payer: Self-pay

## 2020-11-16 DIAGNOSIS — I359 Nonrheumatic aortic valve disorder, unspecified: Secondary | ICD-10-CM | POA: Insufficient documentation

## 2020-11-16 LAB — ECHOCARDIOGRAM COMPLETE
AR max vel: 1.54 cm2
AV Area VTI: 1.53 cm2
AV Area mean vel: 1.69 cm2
AV Mean grad: 17 mmHg
AV Peak grad: 32.3 mmHg
Ao pk vel: 2.84 m/s
Area-P 1/2: 4.49 cm2
P 1/2 time: 450 msec
S' Lateral: 3.2 cm

## 2020-12-07 NOTE — H&P (View-Only) (Signed)
HPI: FU AS. Patient with history of CVA. CTA of neck October 2020 showed no large vessel occlusion or high-grade stenosis. ABIs 4/21 normal. Echocardiogram 9/21 showed normal LV function, mild AS (mean gradient 15 mmHg) and moderate AI.  Most recent echocardiogram September 2022 showed normal LV function, grade 1 diastolic dysfunction, possible prolapse of left coronary cusp with severe aortic insufficiency and mild aortic stenosis (mean gradient 17 mmHg).  Since last seen patient notes increased dyspnea on exertion.  He denies orthopnea, PND, pedal edema or syncope.  He states he has occasional dizzy spells when walking.  He has continued episodes of chest pain lasting 1 to 2 minutes and described as sharp.  Not related to exertion.  These are longstanding.  Current Outpatient Medications  Medication Sig Dispense Refill   aspirin EC 81 MG tablet Take 1 tablet (81 mg total) by mouth daily. Swallow whole. 90 tablet 3   No current facility-administered medications for this visit.     Past Medical History:  Diagnosis Date   Alcohol use    Allergy    seasonal - pollen   Aortic valve disorder    echo 9-21 shows mild AVS- Mean 15, Peak 27.6, AVA 1.53   Arthritis    Cocaine use    CVA (cerebral vascular accident) ([place]) 2020   Dizziness 11/2018   Family history of adverse reaction to anesthesia    " [place] "   GERD (gastroesophageal reflux disease)    TUMS PRN but rare use   Peri-rectal abscess 06/09/2016   PUD (peptic ulcer disease)    3 times prior to CVA due to ASA use   Seizures ([place])    in 20's=- none since   Tobacco use    Vitamin D deficiency 01/2019   Vitamin D deficiency     Past Surgical History:  Procedure Laterality Date   NO PAST SURGERIES      Social History   Socioeconomic History   Marital status: Single    Spouse name: Not on file   Number of children: Not on file   Years of education: Not on file   Highest education level: Not on file   Occupational History    Comment: Architect  Tobacco Use   Smoking status: Some Days    Packs/day: 0.50    Types: Cigarettes   Smokeless tobacco: Never   Tobacco comments:    Smokes half a pack or less daily   Vaping Use   Vaping Use: Never used  Substance and Sexual Activity   Alcohol use: Yes    Comment: 40 oz beer, every now and then   Drug use: Yes    Types: Cocaine, Marijuana   Sexual activity: Yes  Other Topics Concern   Not on file  Social History Narrative   Caffeine 2-3 cups daily.  (Sometimes 1-2 atpm).  Education : 12 th grade.  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    Social Determinants of Health   Financial Resource Strain: Not on file  Food Insecurity: Not on file  Transportation Needs: Not on file  Physical Activity: Not on file  Stress: Not on file  Social Connections: Not on file  Intimate Partner Violence: Not on file    Family History  Problem Relation Age of Onset   Lung cancer Father    Colon cancer Maternal Uncle    Cancer Maternal Uncle    Colon cancer Maternal Uncle  Colon polyps Neg Hx    Esophageal cancer Neg Hx    Rectal cancer Neg Hx    Stomach cancer Neg Hx     ROS: no fevers or chills, productive cough, hemoptysis, dysphasia, odynophagia, melena, hematochezia, dysuria, hematuria, rash, seizure activity, orthopnea, PND, pedal edema, claudication. Remaining systems are negative.  Physical Exam: Well-developed well-nourished in no acute distress.  Skin is warm and dry.  HEENT is normal.  Neck is supple.  Chest is clear to auscultation with normal expansion.  Cardiovascular exam is regular rate and rhythm.  1/6 diastolic murmur left sternal border. Abdominal exam nontender or distended. No masses palpated. Extremities show no edema. neuro grossly intact  A/P  1 aortic stenosis/aortic insufficiency-most recent echocardiogram shows normal LV function.  There was possible prolapse of the left  coronary cusp with severe aortic insufficiency and mild aortic stenosis though elevated gradient could be secondary to aortic insufficiency.  Patient appears to be symptomatic with increased dyspnea on exertion.  I discussed possible valve replacement with him.  We will arrange a transesophageal echocardiogram to better assess aortic valve morphology and severity of AI.  I will also arrange a right and left cardiac catheterization.  The risk and benefits including myocardial infarction, CVA and death discussed and he agrees to proceed.  I will see him back immediately after those tests and we will likely arrange surgical evaluation for possible aortic valve replacement at that time.  Patient in agreement with plan.  2 history of cocaine abuse-we discussed the importance of continuing to avoid.  Patient states he has not used in over a year.  3 tobacco abuse-patient again counseled on discontinuing.  4 history of CVA-continue aspirin.  Kirk Ruths, MD

## 2020-12-07 NOTE — Progress Notes (Signed)
HPI: FU AS. Patient with history of CVA. CTA of neck October 2020 showed no large vessel occlusion or high-grade stenosis. ABIs 4/21 normal. Echocardiogram 9/21 showed normal LV function, mild AS (mean gradient 15 mmHg) and moderate AI.  Most recent echocardiogram September 2022 showed normal LV function, grade 1 diastolic dysfunction, possible prolapse of left coronary cusp with severe aortic insufficiency and mild aortic stenosis (mean gradient 17 mmHg).  Since last seen patient notes increased dyspnea on exertion.  He denies orthopnea, PND, pedal edema or syncope.  He states he has occasional dizzy spells when walking.  He has continued episodes of chest pain lasting 1 to 2 minutes and described as sharp.  Not related to exertion.  These are longstanding.  Current Outpatient Medications  Medication Sig Dispense Refill   aspirin EC 81 MG tablet Take 1 tablet (81 mg total) by mouth daily. Swallow whole. 90 tablet 3   No current facility-administered medications for this visit.     Past Medical History:  Diagnosis Date   Alcohol use    Allergy    seasonal - pollen   Aortic valve disorder    echo 9-21 shows mild AVS- Mean 15, Peak 27.6, AVA 1.53   Arthritis    Cocaine use    CVA (cerebral vascular accident) ([place]) 2020   Dizziness 11/2018   Family history of adverse reaction to anesthesia    " [place] "   GERD (gastroesophageal reflux disease)    TUMS PRN but rare use   Peri-rectal abscess 06/09/2016   PUD (peptic ulcer disease)    3 times prior to CVA due to ASA use   Seizures ([place])    in 20's=- none since   Tobacco use    Vitamin D deficiency 01/2019   Vitamin D deficiency     Past Surgical History:  Procedure Laterality Date   NO PAST SURGERIES      Social History   Socioeconomic History   Marital status: Single    Spouse name: Not on file   Number of children: Not on file   Years of education: Not on file   Highest education level: Not on file   Occupational History    Comment: Architect  Tobacco Use   Smoking status: Some Days    Packs/day: 0.50    Types: Cigarettes   Smokeless tobacco: Never   Tobacco comments:    Smokes half a pack or less daily   Vaping Use   Vaping Use: Never used  Substance and Sexual Activity   Alcohol use: Yes    Comment: 40 oz beer, every now and then   Drug use: Yes    Types: Cocaine, Marijuana   Sexual activity: Yes  Other Topics Concern   Not on file  Social History Narrative   Caffeine 2-3 cups daily.  (Sometimes 1-2 atpm).  Education : 12 th grade.  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    Social Determinants of Health   Financial Resource Strain: Not on file  Food Insecurity: Not on file  Transportation Needs: Not on file  Physical Activity: Not on file  Stress: Not on file  Social Connections: Not on file  Intimate Partner Violence: Not on file    Family History  Problem Relation Age of Onset   Lung cancer Father    Colon cancer Maternal Uncle    Cancer Maternal Uncle    Colon cancer Maternal Uncle  Colon polyps Neg Hx    Esophageal cancer Neg Hx    Rectal cancer Neg Hx    Stomach cancer Neg Hx     ROS: no fevers or chills, productive cough, hemoptysis, dysphasia, odynophagia, melena, hematochezia, dysuria, hematuria, rash, seizure activity, orthopnea, PND, pedal edema, claudication. Remaining systems are negative.  Physical Exam: Well-developed well-nourished in no acute distress.  Skin is warm and dry.  HEENT is normal.  Neck is supple.  Chest is clear to auscultation with normal expansion.  Cardiovascular exam is regular rate and rhythm.  1/6 diastolic murmur left sternal border. Abdominal exam nontender or distended. No masses palpated. Extremities show no edema. neuro grossly intact  A/P  1 aortic stenosis/aortic insufficiency-most recent echocardiogram shows normal LV function.  There was possible prolapse of the left  coronary cusp with severe aortic insufficiency and mild aortic stenosis though elevated gradient could be secondary to aortic insufficiency.  Patient appears to be symptomatic with increased dyspnea on exertion.  I discussed possible valve replacement with him.  We will arrange a transesophageal echocardiogram to better assess aortic valve morphology and severity of AI.  I will also arrange a right and left cardiac catheterization.  The risk and benefits including myocardial infarction, CVA and death discussed and he agrees to proceed.  I will see him back immediately after those tests and we will likely arrange surgical evaluation for possible aortic valve replacement at that time.  Patient in agreement with plan.  2 history of cocaine abuse-we discussed the importance of continuing to avoid.  Patient states he has not used in over a year.  3 tobacco abuse-patient again counseled on discontinuing.  4 history of CVA-continue aspirin.  Kirk Ruths, MD

## 2020-12-13 ENCOUNTER — Ambulatory Visit (INDEPENDENT_AMBULATORY_CARE_PROVIDER_SITE_OTHER): Payer: PRIVATE HEALTH INSURANCE | Admitting: Cardiology

## 2020-12-13 ENCOUNTER — Encounter: Payer: Self-pay | Admitting: Cardiology

## 2020-12-13 ENCOUNTER — Other Ambulatory Visit: Payer: Self-pay

## 2020-12-13 VITALS — BP 118/78 | HR 91 | Ht 72.0 in | Wt 163.6 lb

## 2020-12-13 DIAGNOSIS — I359 Nonrheumatic aortic valve disorder, unspecified: Secondary | ICD-10-CM

## 2020-12-13 DIAGNOSIS — F1490 Cocaine use, unspecified, uncomplicated: Secondary | ICD-10-CM | POA: Diagnosis not present

## 2020-12-13 DIAGNOSIS — Z720 Tobacco use: Secondary | ICD-10-CM

## 2020-12-13 LAB — BASIC METABOLIC PANEL
BUN/Creatinine Ratio: 13 (ref 10–24)
BUN: 17 mg/dL (ref 8–27)
CO2: 26 mmol/L (ref 20–29)
Calcium: 9.8 mg/dL (ref 8.6–10.2)
Chloride: 98 mmol/L (ref 96–106)
Creatinine, Ser: 1.3 mg/dL — ABNORMAL HIGH (ref 0.76–1.27)
Glucose: 137 mg/dL — ABNORMAL HIGH (ref 70–99)
Potassium: 4.4 mmol/L (ref 3.5–5.2)
Sodium: 139 mmol/L (ref 134–144)
eGFR: 63 mL/min/{1.73_m2} (ref >59)

## 2020-12-13 LAB — CBC
Hematocrit: 47.3 % (ref 37.5–51.0)
Hemoglobin: 16 g/dL (ref 13.0–17.7)
MCH: 29.5 pg (ref 26.6–33.0)
MCHC: 33.8 g/dL (ref 31.5–35.7)
MCV: 87 fL (ref 79–97)
Platelets: 309 10*3/uL (ref 150–450)
RBC: 5.43 x10E6/uL (ref 4.14–5.80)
RDW: 12.9 % (ref 11.6–15.4)
WBC: 7.9 10*3/uL (ref 3.4–10.8)

## 2020-12-13 MED ORDER — SODIUM CHLORIDE 0.9% FLUSH: 3.0000 mL | Freq: Two times a day (BID) | INTRAVENOUS | Status: AC

## 2020-12-13 NOTE — Patient Instructions (Signed)
  You are scheduled for a TEE on 01/03/2021 with Dr. Stanford Breed.  Please arrive at the [facility] (Main Entrance A) at [facility]: [address] [place], [place] [address] at 7 am. (1 hour prior to procedure unless lab work is needed; if lab work is needed arrive 1.5 hours ahead)  DIET: Nothing to eat or drink after midnight except a sip of water with medications (see medication instructions below)  FYI: For your safety, and to allow [place] to monitor your vital signs accurately during the surgery/procedure we request that   if you have artificial nails, gel coating, SNS etc. Please have those removed prior to your surgery/procedure. Not having the nail coverings /polish removed may result in cancellation or delay of your surgery/procedure.   Medication Instructions: TAKE MEDICATIONS THE MORNING OF THE PROCEDURE WITH WATER TO GET IT DOWN.   Labs:  Your physician recommends that you HAVE LAB WORK TODAY  You must have a responsible person to drive you home and stay in the waiting area during your procedure. Failure to do so could result in cancellation.  Bring your insurance cards.  *Special Note: Every effort is made to have your procedure done on time. Occasionally there are emergencies that occur at the hospital that may cause delays. Please be patient if a delay does occur.      You are scheduled for a Cardiac Catheterization on Thursday, November 10 with Dr. Peter Martinique.  1. Please arrive at the [facility] (Main Entrance A) at [facility]: [address] [place], [place] [address] at 8:30 AM (This time is two hours before your procedure to ensure your preparation). Free valet parking service is available.   Special note: Every effort is made to have your procedure done on time. Please understand that emergencies sometimes delay scheduled procedures.  2. Diet: Do not eat solid foods after midnight.  The patient may have clear liquids until 5am upon the day of the  procedure.  3. Labs: You will need to have blood drawn on TODAY  4. Medication instructions in preparation for your procedure:  On the morning of your procedure, take your Aspirin and any morning medicines NOT listed above.  You may use sips of water.  5. Plan for one night stay--bring personal belongings. 6. Bring a current list of your medications and current insurance cards. 7. You MUST have a responsible person to drive you home. 8. Someone MUST be with you the first 24 hours after you arrive home or your discharge will be delayed. 9. Please wear clothes that are easy to get on and off and wear slip-on shoes.  Thank you for allowing [place] to care for you!   -- [facility]   Your physician recommends that you schedule a follow-up appointment in: [place]

## 2020-12-14 ENCOUNTER — Encounter: Payer: Self-pay | Admitting: *Deleted

## 2020-12-25 ENCOUNTER — Encounter (HOSPITAL_COMMUNITY): Payer: Self-pay | Admitting: Cardiology

## 2020-12-25 NOTE — Progress Notes (Signed)
Attempted to obtain medical history via telephone, unable to reach at this time. I left a voicemail to return pre surgical testing department's phone call.  

## 2020-12-27 NOTE — Progress Notes (Signed)
HPI: FU AI. Patient with history of CVA. CTA of neck October 2020 showed no large vessel occlusion or high-grade stenosis. ABIs 4/21 normal. Echocardiogram 9/21 showed normal LV function, mild AS (mean gradient 15 mmHg) and moderate AI.  Most recent echocardiogram September 2022 showed normal LV function, grade 1 diastolic dysfunction, possible prolapse of left coronary cusp with severe aortic insufficiency and mild aortic stenosis (mean gradient 17 mmHg).  At last office visit patient was complaining of worsening dyspnea.  We arranged right and left cardiac catheterization which revealed left ventricular end-diastolic pressure of 9, pulmonary capillary wedge pressure 7, normal right heart pressures and normal coronary arteries.  We also arrange a transesophageal echocardiogram which revealed normal LV function, tricuspid aortic valve with moderate to severe aortic insufficiency (doming of the valve with prolapse of the left coronary cusp) and small PFO.  Since last seen patient continues to note dyspnea on exertion worse over the past year.  This occurs with more moderate activities but not routine activities.  It does limit him.  He occasionally has chest pain that last for seconds.  Some dizziness with standing but no syncope.  Current Outpatient Medications  Medication Sig Dispense Refill   aspirin EC 81 MG tablet Take 1 tablet (81 mg total) by mouth daily. Swallow whole. 90 tablet 3   Current Facility-Administered Medications  Medication Dose Route Frequency Provider Last Rate Last Admin   sodium chloride flush (NS) 0.9 % injection 3 mL  3 mL Intravenous Q12H Lelon Perla, MD         Past Medical History:  Diagnosis Date   Alcohol use    Allergy    seasonal - pollen   Aortic valve disorder    echo 9-21 shows mild AVS- Mean 15, Peak 27.6, AVA 1.53   Arthritis    Cocaine use    CVA (cerebral vascular accident) ([place]) 2020   Dizziness 11/2018   Family history of adverse  reaction to anesthesia    " [place] "   GERD (gastroesophageal reflux disease)    TUMS PRN but rare use   Peri-rectal abscess 06/09/2016   PUD (peptic ulcer disease)    3 times prior to CVA due to ASA use   Seizures ([place])    in 20's=- none since   Tobacco use    Vitamin D deficiency 01/2019   Vitamin D deficiency     Past Surgical History:  Procedure Laterality Date   NO PAST SURGERIES     RIGHT/LEFT HEART CATH AND CORONARY ANGIOGRAPHY N/A 01/03/2021   Procedure: RIGHT/LEFT HEART CATH AND CORONARY ANGIOGRAPHY;  Surgeon: Martinique, Peter M, MD;  Location: [facility] [place] [facility];  Service: Cardiovascular;  Laterality: N/A;   TEE WITHOUT CARDIOVERSION N/A 01/03/2021   Procedure: TRANSESOPHAGEAL ECHOCARDIOGRAM (TEE);  Surgeon: Lelon Perla, MD;  Location: [facility] ENDOSCOPY;  Service: Cardiovascular;  Laterality: N/A;    Social History   Socioeconomic History   Marital status: Single    Spouse name: Not on file   Number of children: Not on file   Years of education: Not on file   Highest education level: Not on file  Occupational History    Comment: Construction  Tobacco Use   Smoking status: Some Days    Packs/day: 0.50    Types: Cigarettes   Smokeless tobacco: Never   Tobacco comments:    Smokes half a pack or less daily   Vaping Use   Vaping Use: Never used  Substance and Sexual Activity   Alcohol use: Yes    Comment: 40 oz beer, every now and then   Drug use: Yes    Types: Cocaine, Marijuana   Sexual activity: Yes  Other Topics Concern   Not on file  Social History Narrative   Caffeine 2-3 cups daily.  (Sometimes 1-2 atpm).  Education : 12 th grade.  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    Social Determinants of Health   Financial Resource Strain: Not on file  Food Insecurity: Not on file  Transportation Needs: Not on file  Physical Activity: Not on file  Stress: Not on file  Social Connections: Not on file  Intimate Partner  Violence: Not on file    Family History  Problem Relation Age of Onset   Lung cancer Father    Colon cancer Maternal Uncle    Cancer Maternal Uncle    Colon cancer Maternal Uncle    Colon polyps Neg Hx    Esophageal cancer Neg Hx    Rectal cancer Neg Hx    Stomach cancer Neg Hx     ROS: no fevers or chills, productive cough, hemoptysis, dysphasia, odynophagia, melena, hematochezia, dysuria, hematuria, rash, seizure activity, orthopnea, PND, pedal edema, claudication. Remaining systems are negative.  Physical Exam: Well-developed well-nourished in no acute distress.  Skin is warm and dry.  HEENT is normal.  Neck is supple.  Chest is clear to auscultation with normal expansion.  Cardiovascular exam is regular rate and rhythm.  Abdominal exam nontender or distended. No masses palpated. Extremities show no edema. neuro grossly intact  ECG-normal sinus rhythm at a rate of 69, early repolarization pattern, cannot rule out septal infarct.  Personally reviewed  A/P  1 aortic stenosis/aortic insufficiency-I have personally reviewed the patient's echocardiograms.  He appears to have severe echocardiogram best demonstrated by transthoracic echocardiogram performed September 2022.  His LV function is normal and not dilated.  His right heart pressures were normal at time of catheterization.  However he does have dyspnea on exertion which is clearly worse compared to 1 year ago.  I reviewed the options with him today (close follow-up with repeat echocardiograms versus proceeding with aortic valve replacement).  He feels as though his dyspnea is limiting his activities and would prefer to proceed with surgery now.  I will ask CVTS to review.  2 cocaine abuse-patient has avoided for greater than 1 year.  3 tobacco abuse-patient counseled on discontinuing.  4 prior CVA-continue aspirin.  Kirk Ruths, MD

## 2021-01-01 ENCOUNTER — Telehealth: Payer: Self-pay | Admitting: *Deleted

## 2021-01-01 NOTE — Telephone Encounter (Signed)
Cardiac catheterization scheduled at [facility] for: Thursday January 03, 2021 10:30 AM/TEE 8 AM [facility] Main Entrance A [facility]) at: 7 AM   Nothing to eat or drink after midnight, may have sips of water to take medications.  Usual morning medications can be taken pre-cath with sips of water including aspirin 81 mg.    Confirmed patient has responsible adult to drive home post procedure and be with patient first 24 hours after arriving home. [facility] does allow one visitor to accompany you and wait in the hospital waiting room while you are there for your procedure. You and your visitor will be asked to wear a mask once you enter the hospital.   Patient reports does not currently have any new symptoms concerning for COVID-19 and no household members with COVID-19 like illness.     Reviewed procedure/mask/visitor instructions with patient.             * Patient reports he does not drive and lives in boarding house.              Patient  has asked me to contact his sister, Carlye Grippe, [phone], to discuss transportation and responsible adult to be with him first 24 hours after arriving home if same day discharge.              I have left a message for Carlye Grippe asking her to call me back to discuss transportation/responsible adult.

## 2021-01-01 NOTE — Telephone Encounter (Signed)
Patient was returning call to go over pre-procedure instructions. The patient asked that we call his Sister Carlye Grippe at [phone] to discuss the pre-procedure instructions. She is the one that will take care of everything for him so she needs to know what to do.  Please call his Sister

## 2021-01-01 NOTE — Telephone Encounter (Addendum)
At patient's request, I reviewed procedure instructions with Donald York, his sister. Ms Hardin Negus reports she understands procedure instructions, she will make arrangements for transportation for patient to and from hospital day of procedure and responsible adult to be with him first 24 hours home if same day discharge.  Patient is aware that I have spoken with his sister and arrangements will be made by her for transportation/responsible adult.

## 2021-01-03 ENCOUNTER — Ambulatory Visit (HOSPITAL_BASED_OUTPATIENT_CLINIC_OR_DEPARTMENT_OTHER): Payer: Self-pay

## 2021-01-03 ENCOUNTER — Encounter (HOSPITAL_COMMUNITY): Payer: Self-pay | Admitting: Cardiology

## 2021-01-03 ENCOUNTER — Encounter (HOSPITAL_COMMUNITY)
Admission: RE | Disposition: A | Discharge status: Home / Self Care | Payer: Self-pay | Source: Home / Self Care | Attending: Cardiology

## 2021-01-03 ENCOUNTER — Ambulatory Visit (HOSPITAL_COMMUNITY): Payer: Self-pay | Admitting: Certified Registered Nurse Anesthetist

## 2021-01-03 ENCOUNTER — Ambulatory Visit (HOSPITAL_COMMUNITY)
Admission: RE | Admit: 2021-01-03 | Discharge: 2021-01-03 | Disposition: A | Discharge status: Home / Self Care | Payer: PRIVATE HEALTH INSURANCE | Attending: Cardiology | Admitting: Cardiology

## 2021-01-03 ENCOUNTER — Other Ambulatory Visit: Payer: Self-pay

## 2021-01-03 ENCOUNTER — Ambulatory Visit (HOSPITAL_COMMUNITY)
Admission: RE | Disposition: A | Discharge status: Home / Self Care | Payer: Self-pay | Source: Home / Self Care | Attending: Cardiology

## 2021-01-03 DIAGNOSIS — F1490 Cocaine use, unspecified, uncomplicated: Secondary | ICD-10-CM

## 2021-01-03 DIAGNOSIS — I700 Atherosclerosis of aorta: Secondary | ICD-10-CM | POA: Insufficient documentation

## 2021-01-03 DIAGNOSIS — I351 Nonrheumatic aortic (valve) insufficiency: Secondary | ICD-10-CM | POA: Diagnosis not present

## 2021-01-03 DIAGNOSIS — Q2112 Patent foramen ovale: Secondary | ICD-10-CM | POA: Insufficient documentation

## 2021-01-03 DIAGNOSIS — Z8673 Personal history of transient ischemic attack (TIA), and cerebral infarction without residual deficits: Secondary | ICD-10-CM | POA: Insufficient documentation

## 2021-01-03 DIAGNOSIS — I359 Nonrheumatic aortic valve disorder, unspecified: Secondary | ICD-10-CM

## 2021-01-03 DIAGNOSIS — Z7982 Long term (current) use of aspirin: Secondary | ICD-10-CM | POA: Insufficient documentation

## 2021-01-03 DIAGNOSIS — Z720 Tobacco use: Secondary | ICD-10-CM | POA: Diagnosis present

## 2021-01-03 HISTORY — PX: RIGHT/LEFT HEART CATH AND CORONARY ANGIOGRAPHY: CATH118266

## 2021-01-03 HISTORY — PX: TEE WITHOUT CARDIOVERSION: SHX5443

## 2021-01-03 LAB — POCT I-STAT 7, (LYTES, BLD GAS, ICA,H+H)
Acid-base deficit: 2 mmol/L (ref 0.0–2.0)
Bicarbonate: 24.5 mmol/L (ref 20.0–28.0)
Calcium, Ion: 1.22 mmol/L (ref 1.15–1.40)
HCT: 38 % — ABNORMAL LOW (ref 39.0–52.0)
Hemoglobin: 12.9 g/dL — ABNORMAL LOW (ref 13.0–17.0)
O2 Saturation: 98 %
Potassium: 3.7 mmol/L (ref 3.5–5.1)
Sodium: 139 mmol/L (ref 135–145)
TCO2: 26 mmol/L (ref 22–32)
pCO2 arterial: 47 mmHg (ref 32.0–48.0)
pH, Arterial: 7.325 — ABNORMAL LOW (ref 7.350–7.450)
pO2, Arterial: 112 mmHg — ABNORMAL HIGH (ref 83.0–108.0)

## 2021-01-03 LAB — POCT I-STAT EG7
Acid-base deficit: 1 mmol/L (ref 0.0–2.0)
Acid-base deficit: 1 mmol/L (ref 0.0–2.0)
Bicarbonate: 25.8 mmol/L (ref 20.0–28.0)
Bicarbonate: 25.2 mmol/L (ref 20.0–28.0)
Calcium, Ion: 1.22 mmol/L (ref 1.15–1.40)
Calcium, Ion: 1.16 mmol/L (ref 1.15–1.40)
HCT: 39 % (ref 39.0–52.0)
HCT: 38 % — ABNORMAL LOW (ref 39.0–52.0)
Hemoglobin: 13.3 g/dL (ref 13.0–17.0)
Hemoglobin: 12.9 g/dL — ABNORMAL LOW (ref 13.0–17.0)
O2 Saturation: 63 %
O2 Saturation: 65 %
Potassium: 3.7 mmol/L (ref 3.5–5.1)
Potassium: 3.5 mmol/L (ref 3.5–5.1)
Sodium: 140 mmol/L (ref 135–145)
Sodium: 141 mmol/L (ref 135–145)
TCO2: 27 mmol/L (ref 22–32)
TCO2: 27 mmol/L (ref 22–32)
pCO2, Ven: 48.3 mmHg (ref 44.0–60.0)
pCO2, Ven: 47.7 mmHg (ref 44.0–60.0)
pH, Ven: 7.336 (ref 7.250–7.430)
pH, Ven: 7.332 (ref 7.250–7.430)
pO2, Ven: 35 mmHg (ref 32.0–45.0)
pO2, Ven: 36 mmHg (ref 32.0–45.0)

## 2021-01-03 LAB — ECHO TEE
AV Mean grad: 4 mmHg
AV Peak grad: 7.7 mmHg
Ao pk vel: 1.39 m/s
P 1/2 time: 367 msec

## 2021-01-03 SURGERY — ECHOCARDIOGRAM, TRANSESOPHAGEAL
Anesthesia: Monitor Anesthesia Care

## 2021-01-03 SURGERY — RIGHT/LEFT HEART CATH AND CORONARY ANGIOGRAPHY
Anesthesia: LOCAL

## 2021-01-03 MED ORDER — SODIUM CHLORIDE 0.9 % IV SOLN: INTRAVENOUS | Status: DC

## 2021-01-03 MED ORDER — HEPARIN SODIUM (PORCINE) 1000 UNIT/ML IJ SOLN
INTRAMUSCULAR | Status: AC
  Filled 2021-01-03: qty 1

## 2021-01-03 MED ORDER — MIDAZOLAM HCL 2 MG/2ML IJ SOLN
INTRAMUSCULAR | Status: AC
  Filled 2021-01-03: qty 2

## 2021-01-03 MED ORDER — SODIUM CHLORIDE 0.9 % WEIGHT BASED INFUSION: 3.0000 mL/kg/h | INTRAVENOUS | Status: AC

## 2021-01-03 MED ORDER — SODIUM CHLORIDE 0.9% FLUSH: 3.0000 mL | INTRAVENOUS | Status: DC | PRN

## 2021-01-03 MED ORDER — VERAPAMIL HCL 2.5 MG/ML IV SOLN: INTRAVENOUS | Status: DC | PRN

## 2021-01-03 MED ORDER — SODIUM CHLORIDE 0.9 % IV SOLN: 250.0000 mL | INTRAVENOUS | Status: DC | PRN

## 2021-01-03 MED ORDER — FENTANYL CITRATE (PF) 100 MCG/2ML IJ SOLN
INTRAMUSCULAR | Status: AC
  Filled 2021-01-03: qty 2

## 2021-01-03 MED ORDER — ONDANSETRON HCL 4 MG/2ML IJ SOLN: 4.0000 mg | Freq: Four times a day (QID) | INTRAMUSCULAR | Status: DC | PRN

## 2021-01-03 MED ORDER — LIDOCAINE HCL (PF) 1 % IJ SOLN
INTRAMUSCULAR | Status: DC | PRN
  Administered 2021-01-03: 2 mL via SUBCUTANEOUS

## 2021-01-03 MED ORDER — VERAPAMIL HCL 2.5 MG/ML IV SOLN
INTRAVENOUS | Status: AC
  Filled 2021-01-03: qty 2

## 2021-01-03 MED ORDER — LIDOCAINE HCL (PF) 1 % IJ SOLN
INTRAMUSCULAR | Status: AC
  Filled 2021-01-03: qty 30

## 2021-01-03 MED ORDER — LIDOCAINE 2% (20 MG/ML) 5 ML SYRINGE
INTRAMUSCULAR | Status: DC | PRN
  Administered 2021-01-03: 100 mg via INTRAVENOUS

## 2021-01-03 MED ORDER — SODIUM CHLORIDE 0.9% FLUSH: 3.0000 mL | Freq: Two times a day (BID) | INTRAVENOUS | Status: DC

## 2021-01-03 MED ORDER — ASPIRIN 81 MG PO CHEW
81.0000 mg | CHEWABLE_TABLET | ORAL | Status: AC
  Administered 2021-01-03: 81 mg via ORAL

## 2021-01-03 MED ORDER — FENTANYL CITRATE (PF) 100 MCG/2ML IJ SOLN
INTRAMUSCULAR | Status: DC | PRN
  Administered 2021-01-03: 25 ug via INTRAVENOUS

## 2021-01-03 MED ORDER — ACETAMINOPHEN 325 MG PO TABS: 650.0000 mg | ORAL_TABLET | ORAL | Status: DC | PRN

## 2021-01-03 MED ORDER — SODIUM CHLORIDE 0.9 % WEIGHT BASED INFUSION: 1.0000 mL/kg/h | INTRAVENOUS | Status: DC

## 2021-01-03 MED ORDER — HEPARIN SODIUM (PORCINE) 1000 UNIT/ML IJ SOLN
INTRAMUSCULAR | Status: DC | PRN
  Administered 2021-01-03: 4000 [IU] via INTRAVENOUS

## 2021-01-03 MED ORDER — IOHEXOL 350 MG/ML SOLN
INTRAVENOUS | Status: DC | PRN
  Administered 2021-01-03: 100 mL via INTRA_ARTERIAL

## 2021-01-03 MED ORDER — HEPARIN (PORCINE) IN NACL 1000-0.9 UT/500ML-% IV SOLN
INTRAVENOUS | Status: AC
  Filled 2021-01-03: qty 1000

## 2021-01-03 MED ORDER — PHENYLEPHRINE 40 MCG/ML (10ML) SYRINGE FOR IV PUSH (FOR BLOOD PRESSURE SUPPORT)
PREFILLED_SYRINGE | INTRAVENOUS | Status: DC | PRN
  Administered 2021-01-03: 80 ug via INTRAVENOUS
  Administered 2021-01-03 (×3): 120 ug via INTRAVENOUS

## 2021-01-03 MED ORDER — PROPOFOL 10 MG/ML IV BOLUS
INTRAVENOUS | Status: DC | PRN
  Administered 2021-01-03 (×2): 50 mg via INTRAVENOUS

## 2021-01-03 MED ORDER — ASPIRIN 81 MG PO CHEW
CHEWABLE_TABLET | ORAL | Status: AC
  Filled 2021-01-03: qty 1

## 2021-01-03 MED ORDER — PROPOFOL 500 MG/50ML IV EMUL
INTRAVENOUS | Status: DC | PRN
  Administered 2021-01-03: 150 ug/kg/min via INTRAVENOUS

## 2021-01-03 MED ORDER — MIDAZOLAM HCL 2 MG/2ML IJ SOLN
INTRAMUSCULAR | Status: DC | PRN
  Administered 2021-01-03: 1 mg via INTRAVENOUS

## 2021-01-03 SURGICAL SUPPLY — 12 items

## 2021-01-03 NOTE — Anesthesia Preprocedure Evaluation (Signed)
Anesthesia Evaluation  Patient identified by MRN, date of birth, ID band Patient awake    Reviewed: Allergy & Precautions, NPO status , Patient's Chart, lab work & pertinent test results  Airway Mallampati: II  TM Distance: >3 FB Neck ROM: Full    Dental  (+) Dental Advisory Given   Pulmonary shortness of breath, Current Smoker,    breath sounds clear to auscultation       Cardiovascular negative cardio ROS   Rhythm:Regular Rate:Normal  Severe AI. Mild AS   Neuro/Psych  Headaches, CVA    GI/Hepatic Neg liver ROS, PUD, GERD  ,  Endo/Other  negative endocrine ROS  Renal/GU Renal disease     Musculoskeletal  (+) Arthritis ,   Abdominal   Peds  Hematology negative hematology ROS (+)   Anesthesia Other Findings   Reproductive/Obstetrics                             Lab Results  Component Value Date   WBC 7.9 12/13/2020   HGB 16.0 12/13/2020   HCT 47.3 12/13/2020   MCV 87 12/13/2020   PLT 309 12/13/2020   Lab Results  Component Value Date   CREATININE 1.30 (H) 12/13/2020   BUN 17 12/13/2020   NA 139 12/13/2020   K 4.4 12/13/2020   CL 98 12/13/2020   CO2 26 12/13/2020    Anesthesia Physical Anesthesia Plan  ASA: 3  Anesthesia Plan: MAC   Post-op Pain Management:    Induction:   PONV Risk Score and Plan: 0 and Propofol infusion  Airway Management Planned: Natural Airway and Nasal Cannula  Additional Equipment:   Intra-op Plan:   Post-operative Plan:   Informed Consent: I have reviewed the patients History and Physical, chart, labs and discussed the procedure including the risks, benefits and alternatives for the proposed anesthesia with the patient or authorized representative who has indicated his/her understanding and acceptance.       Plan Discussed with: CRNA  Anesthesia Plan Comments:         Anesthesia Quick Evaluation

## 2021-01-03 NOTE — Interval H&P Note (Signed)
History and Physical Interval Note:  01/03/2021 11:26 AM  Donald York  has presented today for surgery, with the diagnosis of aortic valve disorder.  The various methods of treatment have been discussed with the patient and family. After consideration of risks, benefits and other options for treatment, the patient has consented to  Procedure(s): RIGHT/LEFT HEART CATH AND CORONARY ANGIOGRAPHY (N/A) as a surgical intervention.  The patient's history has been reviewed, patient examined, no change in status, stable for surgery.  I have reviewed the patient's chart and labs.  Questions were answered to the patient's satisfaction.     Collier Salina [facility] 01/03/2021 11:26 AM

## 2021-01-03 NOTE — Transfer of Care (Signed)
Immediate Anesthesia Transfer of Care Note  Patient: Donald York  Procedure(s) Performed: TRANSESOPHAGEAL ECHOCARDIOGRAM (TEE)  Patient Location: PACU  Anesthesia Type:MAC  Level of Consciousness: awake and alert   Airway & Oxygen Therapy: Patient Spontanous Breathing  Post-op Assessment: Report given to RN and Post -op Vital signs reviewed and stable  Post vital signs: Reviewed and stable  Last Vitals:  Vitals Value Taken Time  BP 105/75 01/03/21 0858  Temp    Pulse 59 01/03/21 0901  Resp 15 01/03/21 0901  SpO2 99 % 01/03/21 0901  Vitals shown include unvalidated device data.  Last Pain:  Vitals:   01/03/21 0740  TempSrc: Oral  PainSc: 0-No pain         Complications: No notable events documented.

## 2021-01-03 NOTE — Progress Notes (Signed)
  Echocardiogram Echocardiogram Transesophageal has been performed.  Fidel Levy 01/03/2021, 9:13 AM

## 2021-01-03 NOTE — Interval H&P Note (Signed)
History and Physical Interval Note:  01/03/2021 7:26 AM  Donald York  has presented today for surgery, with the diagnosis of AORTIC VALVE DISORDER.  The various methods of treatment have been discussed with the patient and family. After consideration of risks, benefits and other options for treatment, the patient has consented to  Procedure(s): TRANSESOPHAGEAL ECHOCARDIOGRAM (TEE) (N/A) as a surgical intervention.  The patient's history has been reviewed, patient examined, no change in status, stable for surgery.  I have reviewed the patient's chart and labs.  Questions were answered to the patient's satisfaction.     Kirk Ruths

## 2021-01-03 NOTE — H&P (Signed)
Office Visit 12/13/2020 [facility] Wendy Poet, MD Cardiology Aortic valve disorder +2 more Dx Follow-up ; Referred by Vevelyn Francois, NP Reason for Visit   Additional Documentation  Vitals:  BP 118/78 Pulse 91 Ht 6' (1.829 m) Wt 74.2 kg SpO2 98% BMI 22.19 kg/m BSA 1.94 m  More Vitals  Flowsheets:  Anthropometrics, NEWS, MEWS Score   Encounter Info:  Billing Info, History, Allergies, Detailed Report    All Notes    Progress Notes by Lelon Perla, MD at 12/13/2020 8:40 AM  Author: Lelon Perla, MD Author Type: Physician Filed: 12/13/2020  8:46 AM  Note Status: Signed Cosign: Cosign Not Required Encounter Date: 12/13/2020  Editor: Lelon Perla, MD (Physician)                        HPI: FU AS. Patient with history of CVA. CTA of neck October 2020 showed no large vessel occlusion or high-grade stenosis. ABIs 4/21 normal. Echocardiogram 9/21 showed normal LV function, mild AS (mean gradient 15 mmHg) and moderate AI.  Most recent echocardiogram September 2022 showed normal LV function, grade 1 diastolic dysfunction, possible prolapse of left coronary cusp with severe aortic insufficiency and mild aortic stenosis (mean gradient 17 mmHg).  Since last seen patient notes increased dyspnea on exertion.  He denies orthopnea, PND, pedal edema or syncope.  He states he has occasional dizzy spells when walking.  He has continued episodes of chest pain lasting 1 to 2 minutes and described as sharp.  Not related to exertion.  These are longstanding.         Current Outpatient Medications  Medication Sig Dispense Refill   aspirin EC 81 MG tablet Take 1 tablet (81 mg total) by mouth daily. Swallow whole. 90 tablet 3    No current facility-administered medications for this visit.            Past Medical History:  Diagnosis Date   Alcohol use     Allergy      seasonal - pollen   Aortic valve disorder      echo 9-21 shows mild AVS- Mean  15, Peak 27.6, AVA 1.53   Arthritis     Cocaine use     CVA (cerebral vascular accident) ([place]) 2020   Dizziness 11/2018   Family history of adverse reaction to anesthesia      " [place] "   GERD (gastroesophageal reflux disease)      TUMS PRN but rare use   Peri-rectal abscess 06/09/2016   PUD (peptic ulcer disease)      3 times prior to CVA due to ASA use   Seizures ([place])      in 20's=- none since   Tobacco use     Vitamin D deficiency 01/2019   Vitamin D deficiency             Past Surgical History:  Procedure Laterality Date   NO PAST SURGERIES          Social History         Socioeconomic History   Marital status: Single      Spouse name: Not on file   Number of children: Not on file   Years of education: Not on file   Highest education level: Not on file  Occupational History      Comment: Construction  Tobacco Use   Smoking status: Some Days      Packs/day: 0.50  Types: Cigarettes   Smokeless tobacco: Never   Tobacco comments:      Smokes half a pack or less daily   Vaping Use   Vaping Use: Never used  Substance and Sexual Activity   Alcohol use: Yes      Comment: 40 oz beer, every now and then   Drug use: Yes      Types: Cocaine, Marijuana   Sexual activity: Yes  Other Topics Concern   Not on file  Social History Narrative    Caffeine 2-3 cups daily.  (Sometimes 1-2 atpm).  Education : 12 th grade.  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     Social Determinants of Health    Financial Resource Strain: Not on file  Food Insecurity: Not on file  Transportation Needs: Not on file  Physical Activity: Not on file  Stress: Not on file  Social Connections: Not on file  Intimate Partner Violence: Not on file           Family History  Problem Relation Age of Onset   Lung cancer Father     Colon cancer Maternal Uncle     Cancer Maternal Uncle     Colon cancer Maternal Uncle     Colon polyps Neg Hx      Esophageal cancer Neg Hx     Rectal cancer Neg Hx     Stomach cancer Neg Hx        ROS: no fevers or chills, productive cough, hemoptysis, dysphasia, odynophagia, melena, hematochezia, dysuria, hematuria, rash, seizure activity, orthopnea, PND, pedal edema, claudication. Remaining systems are negative.   Physical Exam: Well-developed well-nourished in no acute distress.  Skin is warm and dry.  HEENT is normal.  Neck is supple.  Chest is clear to auscultation with normal expansion.  Cardiovascular exam is regular rate and rhythm.  1/6 diastolic murmur left sternal border. Abdominal exam nontender or distended. No masses palpated. Extremities show no edema. neuro grossly intact   A/P   1 aortic stenosis/aortic insufficiency-most recent echocardiogram shows normal LV function.  There was possible prolapse of the left coronary cusp with severe aortic insufficiency and mild aortic stenosis though elevated gradient could be secondary to aortic insufficiency.  Patient appears to be symptomatic with increased dyspnea on exertion.  I discussed possible valve replacement with him.  We will arrange a transesophageal echocardiogram to better assess aortic valve morphology and severity of AI.  I will also arrange a right and left cardiac catheterization.  The risk and benefits including myocardial infarction, CVA and death discussed and he agrees to proceed.  I will see him back immediately after those tests and we will likely arrange surgical evaluation for possible aortic valve replacement at that time.  Patient in agreement with plan.   2 history of cocaine abuse-we discussed the importance of continuing to avoid.  Patient states he has not used in over a year.   3 tobacco abuse-patient again counseled on discontinuing.   4 history of CVA-continue aspirin.   Kirk Ruths, MD      For TEE; no changes. Kirk Ruths

## 2021-01-03 NOTE — Progress Notes (Signed)
    Transesophageal Echocardiogram Note  Donald York [id number] [birth year]  Procedure: Transesophageal Echocardiogram Indications: Aortic insufficiency  Procedure Details Consent: Obtained Time Out: Verified patient identification, verified procedure, site/side was marked, verified correct patient position, special equipment/implants available, Radiology Safety Procedures followed,  medications/allergies/relevent history reviewed, required imaging and test results available.  Performed  Medications:  Pt sedated by anesthesia with lidocaine 100 mg and diprovan 280 mg IV total.  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   Complications: No apparent complications Patient did tolerate procedure well.  Kirk Ruths, MD

## 2021-01-04 ENCOUNTER — Encounter (HOSPITAL_COMMUNITY): Payer: Self-pay | Admitting: Cardiology

## 2021-01-04 MED FILL — Heparin Sod (Porcine)-NaCl IV Soln 1000 Unit/500ML-0.9%: INTRAVENOUS | Qty: 500 | Status: AC

## 2021-01-04 NOTE — Anesthesia Postprocedure Evaluation (Signed)
Anesthesia Post Note  Patient: Donald York  Procedure(s) Performed: TRANSESOPHAGEAL ECHOCARDIOGRAM (TEE)     Patient location during evaluation: PACU Anesthesia Type: MAC Level of consciousness: awake and alert Pain management: pain level controlled Vital Signs Assessment: post-procedure vital signs reviewed and stable Respiratory status: spontaneous breathing, nonlabored ventilation, respiratory function stable and patient connected to nasal cannula oxygen Cardiovascular status: stable and blood pressure returned to baseline Postop Assessment: no apparent nausea or vomiting Anesthetic complications: no   No notable events documented.  Last Vitals:  Vitals:   01/03/21 1440 01/03/21 1450  BP:    Pulse: (!) 58 (!) 58  Resp:    Temp:    SpO2: 97% 99%    Last Pain:  Vitals:   01/03/21 1242  TempSrc:   PainSc: 0-No pain                 Tiajuana Amass

## 2021-01-07 ENCOUNTER — Encounter: Payer: Self-pay | Admitting: Cardiology

## 2021-01-07 ENCOUNTER — Ambulatory Visit (INDEPENDENT_AMBULATORY_CARE_PROVIDER_SITE_OTHER): Payer: PRIVATE HEALTH INSURANCE | Admitting: Cardiology

## 2021-01-07 ENCOUNTER — Other Ambulatory Visit: Payer: Self-pay

## 2021-01-07 ENCOUNTER — Encounter: Payer: Self-pay | Admitting: Clinical

## 2021-01-07 VITALS — BP 98/64 | HR 69 | Ht 72.0 in | Wt 171.0 lb

## 2021-01-07 DIAGNOSIS — I359 Nonrheumatic aortic valve disorder, unspecified: Secondary | ICD-10-CM

## 2021-01-07 DIAGNOSIS — Z720 Tobacco use: Secondary | ICD-10-CM | POA: Diagnosis not present

## 2021-01-07 NOTE — Progress Notes (Signed)
[facility] General Follow Up Note  01/07/2021 Name: Donald York MRN: [id number] DOB: Feb 04, 1961 Donald York is a 60 y.o. year old male who sees Vevelyn Francois, NP for primary care. LCSW was initially consulted to assist with community resources.  Interpreter: No.   Interpreter Name & Language: none  Assessment: Patient experiencing low income.  Ongoing Intervention: Today patient presented at the Patient [facility]) and reported he had received some forms from disability determination services (DDS) that he needed to fill out. He indicated he hasn't been able to reach staff at [facility], who helped submit his claim. CSW called and left voicemail for [facility] requesting call back.   Review of patient status, including review of consultants reports, relevant laboratory and other test results, and collaboration with appropriate care team members and the patient's provider was performed as part of comprehensive patient evaluation and provision of services.    [name], [facility] [phone]

## 2021-01-07 NOTE — Patient Instructions (Signed)
  Follow-Up: At [facility], you and your health needs are our priority.  As part of our continuing mission to provide you with exceptional heart care, we have created designated Provider Care Teams.  These Care Teams include your primary Cardiologist (physician) and Advanced Practice Providers (APPs -  Physician Assistants and Nurse Practitioners) who all work together to provide you with the care you need, when you need it.  We recommend signing up for the patient portal called "MyChart".  Sign up information is provided on this After Visit Summary.  MyChart is used to connect with patients for Virtual Visits (Telemedicine).  Patients are able to view lab/test results, encounter notes, upcoming appointments, etc.  Non-urgent messages can be sent to your provider as well.   To learn more about what you can do with MyChart, go to [email or web address].    Your next appointment:   4 month(s)  The format for your next appointment:   In Person  Provider:   Kirk Ruths MD

## 2021-01-24 ENCOUNTER — Other Ambulatory Visit: Payer: Self-pay

## 2021-01-24 ENCOUNTER — Institutional Professional Consult (permissible substitution) (INDEPENDENT_AMBULATORY_CARE_PROVIDER_SITE_OTHER): Payer: PRIVATE HEALTH INSURANCE | Admitting: Thoracic Surgery (Cardiothoracic Vascular Surgery)

## 2021-01-24 VITALS — BP 102/79 | HR 71 | Resp 20 | Ht 72.0 in | Wt 170.0 lb

## 2021-01-24 DIAGNOSIS — I350 Nonrheumatic aortic (valve) stenosis: Secondary | ICD-10-CM

## 2021-01-24 NOTE — Progress Notes (Signed)
PCP is Donald Francois, NP Referring Provider is Donald Perla, MD  Chief Complaint  Patient presents with   Aortic Stenosis    Surgical consult, Cardiac Cath and ECHO 01/03/21, ECHO 11/16/20    HPI: [name] is sent for consultation regarding aortic insufficiency.  Donald York is a 60 year old man with a past medical history significant for known mild aortic stenosis and moderate to severe aortic insufficiency, PFO, CVA, reflux, vitamin D deficiency, remote substance abuse.  Donald York had a stroke in 2020.  There is no significant carotid disease.  Echocardiogram showed mild aortic stenosis and moderate AI.  Donald York also was noted to have a small patent foramen ovale.  Donald York recently saw Donald York in follow-up.  A repeat echocardiogram showed normal systolic function with grade 1 diastolic dysfunction.  There was prolapse of the left coronary cusp with severe AI and mild AS with a mean gradient of 17 mmHg.  Donald York has been experiencing shortness of breath with exertion.  Donald York gets short of breath with taking out the trash, distance of about 50 to 60 feet.  Donald York complains of some sharp left-sided chest pain which is not exertional.  It usually last 2 to 3 minutes and happens 2-3 times a day.  Donald York denies any peripheral edema, orthopnea, or paroxysmal nocturnal dyspnea.  Donald York had cardiac catheterization which showed normal coronary arteries.  Donald York is referred for surgery for severe symptomatic AI.  Donald York works in housekeeping.  Donald York does not really have to do a lot of heavy lifting.  Donald York complains of a wisdom tooth that is bothering him.  Donald York has not seen a dentist recently.   Past Medical History:  Diagnosis Date   Alcohol use    Allergy    seasonal - pollen   Aortic valve disorder    echo 9-21 shows mild AVS- Mean 15, Peak 27.6, AVA 1.53   Arthritis    Cocaine use    CVA (cerebral vascular accident) ([place]) 2020   Dizziness 11/2018   Family history of adverse reaction to anesthesia    " Donald York "   GERD  (gastroesophageal reflux disease)    TUMS PRN but rare use   Peri-rectal abscess 06/09/2016   PUD (peptic ulcer disease)    3 times prior to CVA due to ASA use   Seizures ([place])    in 20's=- none since   Tobacco use    Vitamin D deficiency 01/2019   Vitamin D deficiency     Past Surgical History:  Procedure Laterality Date   NO PAST SURGERIES     RIGHT/LEFT HEART CATH AND CORONARY ANGIOGRAPHY N/A 01/03/2021   Procedure: RIGHT/LEFT HEART CATH AND CORONARY ANGIOGRAPHY;  Surgeon: Martinique, Peter M, MD;  Location: [facility];  Service: Cardiovascular;  Laterality: N/A;   TEE WITHOUT CARDIOVERSION N/A 01/03/2021   Procedure: TRANSESOPHAGEAL ECHOCARDIOGRAM (TEE);  Surgeon: Donald Perla, MD;  Location: [facility];  Service: Cardiovascular;  Laterality: N/A;    Family History  Problem Relation Age of Onset   Lung cancer Father    Colon cancer Maternal Uncle    Cancer Maternal Uncle    Colon cancer Maternal Uncle    Colon polyps Neg Hx    Esophageal cancer Neg Hx    Rectal cancer Neg Hx    Stomach cancer Neg Hx     Social History Social History   Tobacco Use   Smoking status: Some Days    Packs/day: 0.50  Types: Cigarettes   Smokeless tobacco: Never   Tobacco comments:    Smokes half a pack or less daily   Vaping Use   Vaping Use: Never used  Substance Use Topics   Alcohol use: Yes    Comment: 40 oz beer, every now and then   Drug use: Yes    Types: Cocaine, Marijuana    Current Outpatient Medications  Medication Sig Dispense Refill   aspirin EC 81 MG tablet Take 1 tablet (81 mg total) by mouth daily. Swallow whole. 90 tablet 3   Current Facility-Administered Medications  Medication Dose Route Frequency Provider Last Rate Last Admin   sodium chloride flush (NS) 0.9 % injection 3 mL  3 mL Intravenous Q12H Crenshaw, Denice Bors, MD        No Known Allergies  Review of Systems  Constitutional:  Positive for activity change and fatigue.  HENT:  Positive  for dental problem. Negative for trouble swallowing and voice change.   Respiratory:  Positive for shortness of breath (See HPI).   Cardiovascular:  Positive for chest pain (See HPI). Negative for palpitations and leg swelling.  Gastrointestinal:  Negative for abdominal distention and abdominal pain.  Genitourinary:  Negative for difficulty urinating and dysuria.  Musculoskeletal:  Negative for arthralgias.  Neurological:  Positive for dizziness. Negative for syncope and weakness.  Hematological:  Negative for adenopathy. Does not bruise/bleed easily.  All other systems reviewed and are negative.  BP 102/79   Pulse 71   Resp 20   Ht 6' (1.829 York)   Wt 170 lb (77.1 kg)   SpO2 97% Comment: RA  BMI 23.06 kg/York  Physical Exam Vitals reviewed.  Constitutional:      General: Donald York is not in acute distress.    Appearance: Normal appearance.  HENT:     Head: Normocephalic and atraumatic.     Comments: Poor dentition Eyes:     General: No scleral icterus.    Extraocular Movements: Extraocular movements intact.  Cardiovascular:     Rate and Rhythm: Normal rate and regular rhythm.     Heart sounds: Murmur (2/6 systolic and diastolic murmur) heard.  Pulmonary:     Effort: No respiratory distress.     Breath sounds: Normal breath sounds. No wheezing or rales.  Abdominal:     General: There is no distension.     Palpations: Abdomen is soft.     Tenderness: There is no abdominal tenderness.  Musculoskeletal:        General: No swelling.  Skin:    General: Skin is warm and dry.  Neurological:     General: No focal deficit present.     Mental Status: Donald York is alert and oriented to person, place, and time.     Cranial Nerves: No cranial nerve deficit.     Motor: No weakness.    Diagnostic Tests:  Transesophageal echocardiogram 01/03/2021 IMPRESSIONS     1. Tricuspid aortic valve with doming and probable prolapse of left  coronary cusp; moderate to severe AI.   2. Left ventricular  ejection fraction, by estimation, is 55 to 60%. The  left ventricle has normal function. The left ventricle has no regional  wall motion abnormalities.   3. Right ventricular systolic function is normal. The right ventricular  size is normal.   4. No left atrial/left atrial appendage thrombus was detected.   5. The mitral valve is normal in structure. Trivial mitral valve  regurgitation.   6. The aortic valve is tricuspid.  Aortic valve regurgitation is moderate  to severe.   7. There is mild (Grade II) plaque involving the descending aorta.   8. Evidence of atrial level shunting detected by color flow Doppler.  There is a small patent foramen ovale.   I personally reviewed the TEE images.  There is a tricuspid aortic valve with moderate to severe aortic insufficiency.  Normal left ventricular systolic function. I personally reviewed his catheterization images.  There is normal coronary arteries.  Impression: Donald York is a 60 year old man with a past medical history significant for known mild aortic stenosis and moderate to severe aortic insufficiency, PFO, CVA, reflux, vitamin D deficiency, remote substance abuse.  CVA 2020-no residual  Aortic insufficiency with mild aortic stenosis-severe with possible prolapse of the left coronary cusp.  Symptomatic with dyspnea.  Preserved left ventricular function.  Given his rather significant dyspnea aortic valve repair or replacement is indicated.  This valve may be repairable but we would not know that until the time of surgery.  I reviewed the echocardiogram images with Donald York and discussed possibility of aortic valve repair or replacement with him.  We did discuss the types of valves mechanical and tissue and the relative advantages and disadvantages of each type.  I informed him of the general nature of the procedure including the need for general anesthesia, the incision to be used, use of cardiopulmonary bypass, use of drainage tubes and  pacing wires postoperatively, the expected hospital stay, and the overall recovery.  I informed him of the indications, risks, benefits, and alternatives.  Donald York understands the risks include, but not limited to death, MI, DVT, PE, bleeding, possibly transfusion, infection, cardiac arrhythmias, heart block requiring pacemaker, as well as possibility of other unforeseeable complications.  Donald York has a wisdom tooth that is causing him pain.  Donald York has not seen a dentist recently.  Donald York was instructed to see a dentist and have his teeth fixed as soon as possible.  Once Donald York does that Donald York will call and we can schedule surgery.  Donald York does have a small PFO.  We will plan to close that at the time of his aortic valve replacement.  Plan: See dentist Donald York will call to schedule aortic valve repair or replacement and closure of PFO once his dental work has been completed.  Melrose Nakayama, MD [facility] [phone]

## 2021-01-29 ENCOUNTER — Encounter: Payer: PRIVATE HEALTH INSURANCE | Admitting: Thoracic Surgery (Cardiothoracic Vascular Surgery)

## 2021-01-29 ENCOUNTER — Other Ambulatory Visit: Payer: Self-pay

## 2021-01-29 ENCOUNTER — Ambulatory Visit (INDEPENDENT_AMBULATORY_CARE_PROVIDER_SITE_OTHER): Payer: PRIVATE HEALTH INSURANCE | Admitting: Dentistry

## 2021-01-29 ENCOUNTER — Encounter (HOSPITAL_COMMUNITY): Payer: Self-pay | Admitting: Dentistry

## 2021-01-29 VITALS — BP 111/60 | HR 69 | Temp 98.4°F

## 2021-01-29 DIAGNOSIS — K0889 Other specified disorders of teeth and supporting structures: Secondary | ICD-10-CM

## 2021-01-29 DIAGNOSIS — K056 Periodontal disease, unspecified: Secondary | ICD-10-CM

## 2021-01-29 DIAGNOSIS — K036 Deposits [accretions] on teeth: Secondary | ICD-10-CM

## 2021-01-29 DIAGNOSIS — K08109 Complete loss of teeth, unspecified cause, unspecified class: Secondary | ICD-10-CM

## 2021-01-29 DIAGNOSIS — F40232 Fear of other medical care: Secondary | ICD-10-CM

## 2021-01-29 DIAGNOSIS — I351 Nonrheumatic aortic (valve) insufficiency: Secondary | ICD-10-CM

## 2021-01-29 DIAGNOSIS — K083 Retained dental root: Secondary | ICD-10-CM

## 2021-01-29 DIAGNOSIS — K029 Dental caries, unspecified: Secondary | ICD-10-CM

## 2021-01-29 DIAGNOSIS — K045 Chronic apical periodontitis: Secondary | ICD-10-CM

## 2021-01-29 DIAGNOSIS — Z01818 Encounter for other preprocedural examination: Secondary | ICD-10-CM

## 2021-01-29 NOTE — Patient Instructions (Signed)
[facility] Dietrich Samuelson B. Chanie Soucek, D.M.D. Phone: [phone] Fax: [phone]   It was a pleasure seeing you today!  Please refer to the information below regarding your dental visit with us.  Call us if any questions or concerns come up after you leave.   Thank you for letting us provide care for you.  If there is anything we can do for you, please let us know.    HEART VALVES AND MOUTH CARE   FACTS: If you have any infection in your mouth, it can infect your heart valve. If you heart valve is infected, you will be seriously ill. Infections in the mouth can be SILENT and do not always cause pain. Examples of infections in the mouth are gum disease, dental cavities, and abscesses. Some possible signs of infection are: Bad breath, bleeding gums, or teeth that are sensitive to sweets, hot, and/or cold. There are many other signs as well.   WHAT YOU HAVE TO DO: Brush your teeth after meals and at bedtime.  Spend at least 2 minutes brushing well, especially behind your back teeth and all around your teeth that stand alone.  Brush at the gumline also. Do not go to bed without brushing your teeth and flossing. If your gums bleed when you brush or floss, do NOT stop brushing or flossing.  Bleeding can be a sign of inflammation or irritation from bacteria.  It usually means that your gums need more attention and better cleaning.  If your dentist or Dr. Irven Ingalsbe gave you a prescription mouthwash to use, make sure to use it as directed. If you run out of the medication, get a refill at the pharmacy. If you were given any other medications or directions by your dentist, please follow them.  If you did not understand the directions or forget what you were told, please call.  We will be happy to refresh your memory. If you need antibiotics before dental procedures, make sure you take them one hour prior to every dental visit as directed.  Get a dental check-up every 4-6  months in order to keep your mouth healthy, or to find and treat any new infection. You will most likely need your teeth cleaned or gums treated at the same time. If you are not able to come in for your scheduled appointment, call your dentist as soon as possible to reschedule. If you have a problem in between dental visits, call your dentist.   QUESTIONS? Call our office during office hours [phone].    WE ARE A TEAM.  OUR GOAL IS:  HEALTHY MOUTH, HEALTHY HEART   

## 2021-01-29 NOTE — Progress Notes (Signed)
Department of Dental Medicine        OUTPATIENT CONSULT   Service Date:   01/29/2021  Patient Name:   Donald York Date of Birth:   1960-07-04 Medical Record Number: [id number]  Referring Provider:           Modesto Charon, MD   PLAN/RECOMMENDATIONS   Assessment There are no current signs of acute odontogenic infection including abscess, edema or erythema, or suspicious lesion requiring biopsy.   Chronic periodontitis, accretions on teeth, loose teeth, severe decay, retained root tips.  Recommendations Extractions of all indicated teeth to decrease the risk of perioperative and postoperative systemic infection and complications.   Establish dental care at an outside office of the patient's choice for routine care including cleanings/periodontal therapy and periodic exams after heart surgery and recovery.  Plan Discuss case with medical team and coordinate treatment as needed. Refer to [facility] for extractions of indicated teeth due to potential complexity of #32 extraction.  Discussed in detail all treatment options and recommendations with the patient and they are agreeable to the plan.    Thank you for consulting with [facility] and for the opportunity to participate in this patient's treatment.  Should you have any questions or concerns, please contact the [facility] at [phone].   01/29/2021 Consult Note:  COVID-19 SCREENING:  The patient denies symptoms concerning for COVID-19 infection including fever, chills, cough, or newly developed shortness of breath.   HISTORY OF PRESENT ILLNESS: Donald York is a very pleasant 60 y.o. male with h/o stroke, arthritis, GERD, allergic rhinitis, chronic kidney disease, tobacco use (0.5 pc/day), alcohol abuse and seizures who was recently diagnosed with severe aortic valve stenosis and is anticipating aortic valve repair.  The patient presents today for a medically necessary dental  consultation as part of their pre-cardiac surgery work-up.   DENTAL HISTORY: The patient reports that he does not have a dentist he sees regularly.  He does have one tooth on the top right that bothers him that is cracked especially when he chews on it, so he has been avoiding chewing on that side. Patient is able to manage oral secretions.  Patient denies dysphagia, odynophagia, dysphonia, SOB and neck pain.  Patient denies fever, rigors and malaise.   CHIEF COMPLAINT:  Here for a preoperative dental exam.   Patient Active Problem List   Diagnosis Date Noted   Aortic insufficiency 01/03/2021   CKD (chronic kidney disease) stage 3, GFR 30-59 ml/min (HCC) 08/17/2020   Chronic bilateral low back pain with bilateral sciatica 06/29/2019   Right leg pain 05/01/2019   Temporal arteritis ([place]) 04/05/2019   History of stroke 04/05/2019   Chronic left-sided headaches 04/05/2019   Shortness of breath 04/05/2019   Stroke-like symptom 02/01/2019   Numbness and tingling of both legs 02/01/2019   Dizziness 02/01/2019   Alcohol use 02/01/2019   Duodenal ulcer 05/25/2018   AKI (acute kidney injury) ([place]) 06/09/2016   Tobacco abuse 06/09/2016   Cocaine use 06/09/2016   Past Medical History:  Diagnosis Date   Alcohol use    Allergy    seasonal - pollen   Aortic valve disorder    echo 9-21 shows mild AVS- Mean 15, Peak 27.6, AVA 1.53   Arthritis    Cocaine use    CVA (cerebral vascular accident) ([place]) 2020   Dizziness 11/2018   Family history of adverse reaction to anesthesia    " [name] "   GERD (  gastroesophageal reflux disease)    TUMS PRN but rare use   Peri-rectal abscess 06/09/2016   PUD (peptic ulcer disease)    3 times prior to CVA due to ASA use   Seizures ([place])    in 20's=- none since   Tobacco use    Vitamin D deficiency 01/2019   Vitamin D deficiency    Past Surgical History:  Procedure Laterality Date   NO PAST SURGERIES     RIGHT/LEFT HEART CATH AND CORONARY  ANGIOGRAPHY N/A 01/03/2021   Procedure: RIGHT/LEFT HEART CATH AND CORONARY ANGIOGRAPHY;  Surgeon: Martinique, Peter M, MD;  Location: [facility];  Service: Cardiovascular;  Laterality: N/A;   TEE WITHOUT CARDIOVERSION N/A 01/03/2021   Procedure: TRANSESOPHAGEAL ECHOCARDIOGRAM (TEE);  Surgeon: Lelon Perla, MD;  [facility];  Service: Cardiovascular;  Laterality: N/A;   No Known Allergies Current Outpatient Medications  Medication Sig Dispense Refill   aspirin EC 81 MG tablet Take 1 tablet (81 mg total) by mouth daily. Swallow whole. 90 tablet 3   Current Facility-Administered Medications  Medication Dose Route Frequency Provider Last Rate Last Admin   sodium chloride flush (NS) 0.9 % injection 3 mL  3 mL Intravenous Q12H Lelon Perla, MD        LABS: Lab Results  Component Value Date   WBC 7.9 12/13/2020   HGB 12.9 (L) 01/03/2021   HCT 38.0 (L) 01/03/2021   MCV 87 12/13/2020   PLT 309 12/13/2020      Component Value Date/Time   NA 139 01/03/2021 1210   NA 139 12/13/2020 0922   K 3.7 01/03/2021 1210   CL 98 12/13/2020 0922   CO2 26 12/13/2020 0922   GLUCOSE 137 (H) 12/13/2020 0922   GLUCOSE 111 (H) 12/21/2018 0000   BUN 17 12/13/2020 0922   CREATININE 1.30 (H) 12/13/2020 0922   CALCIUM 9.8 12/13/2020 0922   GFRNONAA 55 (L) 12/20/2018 2353   GFRAA >60 12/20/2018 2353   Lab Results  Component Value Date   INR 1.0 12/20/2018   No results found for: PTT  Social History   Socioeconomic History   Marital status: Single    Spouse name: Not on file   Number of children: Not on file   Years of education: Not on file   Highest education level: Not on file  Occupational History    Comment: Architect  Tobacco Use   Smoking status: Some Days    Packs/day: 0.50    Types: Cigarettes   Smokeless tobacco: Never   Tobacco comments:    Smokes half a pack or less daily   Vaping Use   Vaping Use: Never used  Substance and Sexual Activity    Alcohol use: Yes    Comment: 40 oz beer, every now and then   Drug use: Yes    Types: Cocaine, Marijuana   Sexual activity: Yes  Other Topics Concern   Not on file  Social History Narrative   Caffeine 2-3 cups daily.  (Sometimes 1-2 atpm).  Education : 12 th grade.  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    Social Determinants of Health   Financial Resource Strain: Not on file  Food Insecurity: Not on file  Transportation Needs: Not on file  Physical Activity: Not on file  Stress: Not on file  Social Connections: Not on file  Intimate Partner Violence: Not on file   Family History  Problem Relation Age of Onset  Lung cancer Father    Colon cancer Maternal Uncle    Cancer Maternal Uncle    Colon cancer Maternal Uncle    Colon polyps Neg Hx    Esophageal cancer Neg Hx    Rectal cancer Neg Hx    Stomach cancer Neg Hx      REVIEW OF SYSTEMS:  Reviewed with the patient as per HPI. Psych:  (+) Dental phobia   VITAL SIGNS: BP 111/60 (BP Location: Right Arm, Patient Position: Sitting, Cuff Size: Normal)   Pulse 69   Temp 98.4 F (36.9 C) (Oral)    PHYSICAL EXAM: General:  Well-developed, comfortable and in no apparent distress. Neurological:  Alert and oriented to person, place and  time. Extraoral:  Facial symmetry present without any edema or erythema.  No swelling or lymphadenopathy.  TMJ asymptomatic without clicks or crepitations.  Intraoral:  Soft tissues appear well-perfused and mucous membranes moist.  FOM and vestibules soft and not raised. Oral cavity without mass or lesion. No signs of infection, parulis, sinus tract, edema or erythema evident upon exam.   DENTAL EXAM: Hard tissue exam completed and charted.    Overall impression:  Poor remaining dentition.    Oral hygiene:  Poor   Periodontal:  Inflamed and erythematous gingival tissue.  Generalized heavy calculus accumulation. (+) Mobility: Class II- #14; Class III-  #3 Caries:  #2, #3, #4, #12, #13, #14, #18, #19, #20, #29, #30, #31, #32  Retained root tips:  #1, #23 Removable/fixed prosthodontics:  Patient denies wearing partial dentures.  Occlusion:  Unable to assess molar occlusion.   Non-functional teeth:  #18, #32 Other findings:   (+) #32 erupted distally & partial bony/soft tissue impacted   RADIOGRAPHIC EXAM:  PAN and Full Mouth Series exposed and interpreted.     Condyles seated bilaterally in fossas.  No evidence of abnormal pathology.  All visualized osseous structures appear WNL.  #32 appear erupted towards the distal & is partially bony-impacted.  Lower right & left posterior teeth drifting towards the mesial.    Generalized moderate horizontal bone loss with areas of localized severe consistent with severe periodontitis.  Significant radiographic calculus evident. Missing teeth- #15, #16, #17, #22, #24, #25, #26.  Caries- #2 & #32 deep decay approximating the pulp; #20M, #12D, #18D, #20D, #29D, #30D, #31D.  Retained root tips- #1 & #23.  Periapical radiolucencies- #1, #2 & #3.    ASSESSMENT:  1.  Severe aortic stenosis 2.  Preoperative dental exam 3.  Missing teeth 4.  Caries 5.  Periodontal disease 6.  Accretions on teeth 7.  Loose teeth 8.  Chronic apical periodontitis 9.  Retained root tips 10.  Dental Phobia   PLAN AND RECOMMENDATIONS: I discussed the risks, benefits, and complications of various scenarios with the patient in relationship to their medical and dental conditions, which included systemic infection such as endocarditis, bacteremia or other serious issues that could potentially occur either before, during or after their anticipated heart surgery if dental/oral concerns are not addressed.  I explained that if any chronic or acute dental/oral infection(s) are addressed and subsequently not maintained following medical optimization and recovery, their risk of the previously mentioned complications are just as high and  could potentially occur postoperatively.  I explained all significant findings of the dental consultation with the patient including several teeth with severe bone loss causing tooth mobility and gum disease/inflammation, heavy tartar or calculus build-up on all of his teeth, teeth with cavities (#2 and #32 with large  cavities and are not restorable) and retained root tips #1 and #23, and the recommended care including extractions of non-restorable, periodontally hopeless or poor teeth and teeth that are chronically infected in order to optimize them for heart surgery from a dental standpoint.  The patient verbalized understanding of all findings, discussion, and recommendations. We then discussed various treatment options to include no treatment, multiple extractions with alveoloplasty, pre-prosthetic surgery as indicated, periodontal therapy, dental restorations, root canal therapy, crown and bridge therapy, implant therapy, and replacement of missing teeth as indicated.  The patient verbalized understanding of all options, and currently wishes to proceed with extractions of indicated teeth.  Recommend referral to oral surgery for indicated extractions due to the potential complexity of tooth #32.   We also discussed his severe periodontal disease and the need for comprehensive dental care after his heart surgery.  I explained that if he does not have his gum disease treated, he will end up losing all of his teeth in the future from bone loss, decay and/or other complications.  I also explained that this increases his risk for postoperative issues after his heart surgery if he does not have his oral health addressed.  He verbalized understanding. Plan to discuss all findings and recommendations with medical team.   The patient will need to establish care at a dental office of his choice  for routine dental care including replacement of missing teeth as needed, cleanings and exams once he recovers from AVR and  is medically optimized.  He will also need to discuss antibiotic prophylaxis with his medical team for any recommendations.  All questions and concerns were invited and addressed.  The patient tolerated today's visit well and departed in stable condition.  I spent in excess of 120 minutes during the conduct of this consultation and >50% of this time involved direct face-to-face encounter for counseling and/or coordination of the patient's care.      [place] Benson Norway, D.M.D.

## 2021-02-01 ENCOUNTER — Ambulatory Visit: Payer: PRIVATE HEALTH INSURANCE | Admitting: Nurse Practitioner

## 2021-02-01 DIAGNOSIS — K083 Retained dental root: Secondary | ICD-10-CM | POA: Insufficient documentation

## 2021-02-01 DIAGNOSIS — K0889 Other specified disorders of teeth and supporting structures: Secondary | ICD-10-CM | POA: Insufficient documentation

## 2021-02-01 DIAGNOSIS — F40232 Fear of other medical care: Secondary | ICD-10-CM | POA: Insufficient documentation

## 2021-02-01 DIAGNOSIS — K045 Chronic apical periodontitis: Secondary | ICD-10-CM | POA: Insufficient documentation

## 2021-02-01 DIAGNOSIS — K029 Dental caries, unspecified: Secondary | ICD-10-CM | POA: Insufficient documentation

## 2021-02-01 DIAGNOSIS — K056 Periodontal disease, unspecified: Secondary | ICD-10-CM | POA: Insufficient documentation

## 2021-02-01 DIAGNOSIS — K08109 Complete loss of teeth, unspecified cause, unspecified class: Secondary | ICD-10-CM | POA: Insufficient documentation

## 2021-02-01 DIAGNOSIS — K036 Deposits [accretions] on teeth: Secondary | ICD-10-CM | POA: Insufficient documentation

## 2021-04-25 NOTE — Progress Notes (Deleted)
? ? ? ? ?HPI:FU AI. Patient with history of CVA. CTA of neck October 2020 showed no large vessel occlusion or high-grade stenosis. ABIs 4/21 normal. Echocardiogram 9/21 showed normal LV function, mild AS (mean gradient 15 mmHg) and moderate AI.  Most recent echocardiogram September 2022 showed normal LV function, grade 1 diastolic dysfunction, possible prolapse of left coronary cusp with severe aortic insufficiency and mild aortic stenosis (mean gradient 17 mmHg).  At last office visit patient was complaining of worsening dyspnea.  We arranged right and left cardiac catheterization which revealed left ventricular end-diastolic pressure of 9, pulmonary capillary wedge pressure 7, normal right heart pressures and normal coronary arteries.  We also arrange a transesophageal echocardiogram which revealed normal LV function, tricuspid aortic valve with moderate to severe aortic insufficiency (doming of the valve with prolapse of the left coronary cusp) and small PFO.  Patient was referred to cardiothoracic surgery and plan is ultimately to have aortic valve repair or replacement and PFO closure.  He was referred to dentistry first.  Extractions were recommended.  Since last seen  ? ?Current Outpatient Medications  ?Medication Sig Dispense Refill  ? aspirin EC 81 MG tablet Take 1 tablet (81 mg total) by mouth daily. Swallow whole. 90 tablet 3  ? ?Current Facility-Administered Medications  ?Medication Dose Route Frequency Provider Last Rate Last Admin  ? sodium chloride flush (NS) 0.9 % injection 3 mL  3 mL Intravenous Q12H Lelon Perla, MD      ? ? ? ?Past Medical History:  ?Diagnosis Date  ? Alcohol use   ? Allergy   ? seasonal - pollen  ? Aortic valve disorder   ? echo 9-21 shows mild AVS- Mean 15, Peak 27.6, AVA 1.53  ? Arthritis   ? Cocaine use   ? CVA (cerebral vascular accident) ([place]) 2020  ? Dizziness 11/2018  ? Family history of adverse reaction to anesthesia   ? " [name] "  ? GERD (gastroesophageal  reflux disease)   ? TUMS PRN but rare use  ? Peri-rectal abscess 06/09/2016  ? PUD (peptic ulcer disease)   ? 3 times prior to CVA due to ASA use  ? Seizures ([place])   ? in 20's=- none since  ? Tobacco use   ? Vitamin D deficiency 01/2019  ? Vitamin D deficiency   ? ? ?Past Surgical History:  ?Procedure Laterality Date  ? NO PAST SURGERIES    ? RIGHT/LEFT HEART CATH AND CORONARY ANGIOGRAPHY N/A 01/03/2021  ? Procedure: RIGHT/LEFT HEART CATH AND CORONARY ANGIOGRAPHY;  Surgeon: Martinique, Peter M, MD;  Location: [facility];  Service: Cardiovascular;  Laterality: N/A;  ? TEE WITHOUT CARDIOVERSION N/A 01/03/2021  ? Procedure: TRANSESOPHAGEAL ECHOCARDIOGRAM (TEE);  Surgeon: Lelon Perla, MD;  Location: [facility] ENDOSCOPY;  Service: Cardiovascular;  Laterality: N/A;  ? ? ?Social History  ? ?Socioeconomic History  ? Marital status: Single  ?  Spouse name: Not on file  ? Number of children: Not on file  ? Years of education: Not on file  ? Highest education level: Not on file  ?Occupational History  ?  Comment: Construction  ?Tobacco Use  ? Smoking status: Some Days  ?  Packs/day: 0.50  ?  Types: Cigarettes  ? Smokeless tobacco: Never  ? Tobacco comments:  ?  Smokes half a pack or less daily   ?Vaping Use  ? Vaping Use: Never used  ?Substance and Sexual Activity  ? Alcohol use: Yes  ?  Comment:  40 oz beer, every now and then  ? Drug use: Yes  ?  Types: Cocaine, Marijuana  ? Sexual activity: Yes  ?Other Topics Concern  ? Not on file  ?Social History Narrative  ? Caffeine 2-3 cups daily.  (Sometimes 1-2 atpm).  Education : 12 th grade.  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   ? ?Social Determinants of Health  ? ?Financial Resource Strain: Not on file  ?Food Insecurity: Not on file  ?Transportation Needs: Not on file  ?Physical Activity: Not on file  ?Stress: Not on file  ?Social Connections: Not on file  ?Intimate Partner Violence: Not on file  ? ? ?Family History  ?Problem Relation Age of  Onset  ? Lung cancer Father   ? Colon cancer Maternal Uncle   ? Cancer Maternal Uncle   ? Colon cancer Maternal Uncle   ? Colon polyps Neg Hx   ? Esophageal cancer Neg Hx   ? Rectal cancer Neg Hx   ? Stomach cancer Neg Hx   ? ? ?ROS: no fevers or chills, productive cough, hemoptysis, dysphasia, odynophagia, melena, hematochezia, dysuria, hematuria, rash, seizure activity, orthopnea, PND, pedal edema, claudication. Remaining systems are negative. ? ?Physical Exam: ?Well-developed well-nourished in no acute distress.  ?Skin is warm and dry.  ?HEENT is normal.  ?Neck is supple.  ?Chest is clear to auscultation with normal expansion.  ?Cardiovascular exam is regular rate and rhythm.  ?Abdominal exam nontender or distended. No masses palpated. ?Extremities show no edema. ?neuro grossly intact ? ?ECG- personally reviewed ? ?A/P ? ?1 aortic stenosis/aortic insufficiency-plan is to proceed with dental extractions and then follow-up with Dr. Roxan Hockey for aortic valve repair or replacement and PFO closure. ? ?2 history of cocaine abuse-patient states he has not used in over 1 year. ? ?3 tobacco abuse-patient again counseled on discontinuing. ? ?3 prior CVA-continue aspirin. ? ?Kirk Ruths, MD ? ? ? ?

## 2021-05-09 ENCOUNTER — Ambulatory Visit: Payer: PRIVATE HEALTH INSURANCE | Admitting: Cardiology

## 2021-05-16 ENCOUNTER — Encounter: Payer: Self-pay | Admitting: Cardiology

## 2021-05-20 ENCOUNTER — Ambulatory Visit: Payer: PRIVATE HEALTH INSURANCE | Admitting: Dermatology

## 2021-09-27 ENCOUNTER — Encounter (HOSPITAL_COMMUNITY): Payer: Self-pay

## 2021-09-27 ENCOUNTER — Other Ambulatory Visit: Payer: Self-pay

## 2021-09-27 ENCOUNTER — Emergency Department (HOSPITAL_COMMUNITY)
Admission: EM | Admit: 2021-09-27 | Discharge: 2021-09-27 | Disposition: A | Discharge status: Home / Self Care | Payer: PRIVATE HEALTH INSURANCE | Attending: Emergency Medicine | Admitting: Emergency Medicine

## 2021-09-27 ENCOUNTER — Emergency Department (HOSPITAL_COMMUNITY): Payer: PRIVATE HEALTH INSURANCE

## 2021-09-27 DIAGNOSIS — Z7982 Long term (current) use of aspirin: Secondary | ICD-10-CM | POA: Insufficient documentation

## 2021-09-27 DIAGNOSIS — R0789 Other chest pain: Secondary | ICD-10-CM | POA: Insufficient documentation

## 2021-09-27 LAB — CBC
HCT: 47 % (ref 39.0–52.0)
Hemoglobin: 15.4 g/dL (ref 13.0–17.0)
MCH: 29.2 pg (ref 26.0–34.0)
MCHC: 32.8 g/dL (ref 30.0–36.0)
MCV: 89.2 fL (ref 80.0–100.0)
Platelets: 294 10*3/uL (ref 150–400)
RBC: 5.27 MIL/uL (ref 4.22–5.81)
RDW: 12.7 % (ref 11.5–15.5)
WBC: 7.2 10*3/uL (ref 4.0–10.5)
nRBC: 0 % (ref 0.0–0.2)

## 2021-09-27 LAB — BASIC METABOLIC PANEL
Anion gap: 6 (ref 5–15)
BUN: 12 mg/dL (ref 8–23)
CO2: 28 mmol/L (ref 22–32)
Calcium: 9.4 mg/dL (ref 8.9–10.3)
Chloride: 106 mmol/L (ref 98–111)
Creatinine, Ser: 1.32 mg/dL — ABNORMAL HIGH (ref 0.61–1.24)
GFR, Estimated: >60 mL/min (ref >60)
Glucose, Bld: 101 mg/dL — ABNORMAL HIGH (ref 70–99)
Potassium: 3.7 mmol/L (ref 3.5–5.1)
Sodium: 140 mmol/L (ref 135–145)

## 2021-09-27 LAB — D-DIMER, QUANTITATIVE: D-Dimer, Quant: 0.37 ug/mL-FEU (ref 0.00–0.50)

## 2021-09-27 LAB — TROPONIN I (HIGH SENSITIVITY)
Troponin I (High Sensitivity): 10 ng/L (ref <18)
Troponin I (High Sensitivity): 8 ng/L (ref <18)

## 2021-09-27 NOTE — ED Provider Notes (Signed)
[facility] EMERGENCY DEPARTMENT Provider Note   CSN: [id number] Arrival date & time: 09/27/21  0813     History {Add pertinent medical, surgical, social history, OB history to HPI:1} Chief Complaint  Patient presents with   Chest Pain    Donald York is a 61 y.o. male.  Patient complains of occasional chest discomfort.  No shortness of breath no sweating.  He has been seen by cardiology and thoracic surgery and needs to have valvular surgery done   Chest Pain      Home Medications Prior to Admission medications   Medication Sig Start Date End Date Taking? Authorizing Provider  Acetaminophen (TYLENOL PO) Take 1 tablet by mouth 2 (two) times daily.   Yes [provider]  aspirin EC 81 MG tablet Take 1 tablet (81 mg total) by mouth daily. Swallow whole. Patient not taking: Reported on 09/27/2021 07/18/20   Lelon Perla, MD      Allergies    Patient has no known allergies.    Review of Systems   Review of Systems  Cardiovascular:  Positive for chest pain.    Physical Exam Updated Vital Signs BP 98/66   Pulse (!) 53   Temp 97.7 F (36.5 C) (Oral)   Resp 18   Ht 6' (1.829 m)   Wt 77.1 kg   SpO2 96%   BMI 23.06 kg/m  Physical Exam  ED Results / Procedures / Treatments   Labs (all labs ordered are listed, but only abnormal results are displayed) Labs Reviewed  BASIC METABOLIC PANEL - Abnormal; Notable for the following components:      Result Value   Glucose, Bld 101 (*)    Creatinine, Ser 1.32 (*)    All other components within normal limits  CBC  D-DIMER, QUANTITATIVE  TROPONIN I (HIGH SENSITIVITY)  TROPONIN I (HIGH SENSITIVITY)    EKG EKG Interpretation  Date/Time:  Friday September 27 2021 08:21:25 EDT Ventricular Rate:  76 PR Interval:  148 QRS Duration: 84 QT Interval:  398 QTC Calculation: 447 R Axis:   73 Text Interpretation: Normal sinus rhythm Normal ECG When compared with ECG of 11-Apr-2019 14:34, PREVIOUS  ECG IS PRESENT Confirmed by Milton Ferguson [phone]) on 09/27/2021 8:29:24 AM  Radiology DG Chest 2 View  Result Date: 09/27/2021 CLINICAL DATA:  Chest pain. EXAM: CHEST - 2 VIEW COMPARISON:  May 26, 2018. FINDINGS: The heart size and mediastinal contours are within normal limits. Both lungs are clear. The visualized skeletal structures are unremarkable. IMPRESSION: No active cardiopulmonary disease. Electronically Signed   By: Marijo Conception M.D.   On: 09/27/2021 08:37    Procedures Procedures  {Document cardiac monitor, telemetry assessment procedure when appropriate:1}  Medications Ordered in ED Medications - No data to display  ED Course/ Medical Decision Making/ A&P                           Medical Decision Making Amount and/or Complexity of Data Reviewed Labs: ordered. Radiology: ordered.   Patient with atypical chest discomfort and history of valvular disease.  He is referred back to cardiology  {Document critical care time when appropriate:1} {Document review of labs and clinical decision tools ie heart score, Chads2Vasc2 etc:1}  {Document your independent review of radiology images, and any outside records:1} {Document your discussion with family members, caretakers, and with consultants:1} {Document social determinants of health affecting pt's care:1} {Document your decision making why or why  not admission, treatments were needed:1} Final Clinical Impression(s) / ED Diagnoses Final diagnoses:  Atypical chest pain    Rx / DC Orders ED Discharge Orders     None

## 2021-09-27 NOTE — ED Triage Notes (Signed)
Patient complains of chest pain that radiates into the back and down left arm along with sob.  Reports he is suppose to have valve replaced but unsure which one.  Reports he had syncopal episode x 2 last night.

## 2021-09-27 NOTE — Discharge Instructions (Signed)
Follow-up with Dr. Stanford Breed or one of his associates within the next couple weeks so they can check out your bowels and your heart and arrange for the thoracic surgeons to consider surgery
